# Patient Record
Sex: Male | Born: 1935 | Race: Black or African American | Hispanic: No | Smoking: Never smoker
Health system: Southern US, Community
[De-identification: ages and names within clinical notes are randomized; demographics above are authoritative.]

## PROBLEM LIST (undated history)

## (undated) DIAGNOSIS — K219 Gastro-esophageal reflux disease without esophagitis: Secondary | ICD-10-CM

## (undated) DIAGNOSIS — I639 Cerebral infarction, unspecified: Secondary | ICD-10-CM

## (undated) DIAGNOSIS — J189 Pneumonia, unspecified organism: Secondary | ICD-10-CM

## (undated) DIAGNOSIS — M1990 Unspecified osteoarthritis, unspecified site: Secondary | ICD-10-CM

## (undated) DIAGNOSIS — I10 Essential (primary) hypertension: Secondary | ICD-10-CM

## (undated) HISTORY — PX: TUMOR EXCISION: SHX421

## (undated) HISTORY — PX: TONSILLECTOMY: SUR1361

---

## 2003-02-02 ENCOUNTER — Encounter: Payer: Self-pay | Admitting: Emergency Medicine

## 2003-02-03 ENCOUNTER — Encounter: Payer: Self-pay | Admitting: Surgery

## 2003-02-03 ENCOUNTER — Inpatient Hospital Stay (HOSPITAL_COMMUNITY): Admission: EM | Admit: 2003-02-03 | Discharge: 2003-02-06 | Payer: Self-pay | Admitting: Emergency Medicine

## 2003-02-04 ENCOUNTER — Encounter: Payer: Self-pay | Admitting: General Surgery

## 2003-02-05 ENCOUNTER — Encounter: Payer: Self-pay | Admitting: General Surgery

## 2010-11-18 ENCOUNTER — Emergency Department (HOSPITAL_BASED_OUTPATIENT_CLINIC_OR_DEPARTMENT_OTHER)
Admission: EM | Admit: 2010-11-18 | Discharge: 2010-11-18 | Disposition: A | Discharge status: Home / Self Care | Payer: Medicare Other | Attending: Emergency Medicine | Admitting: Emergency Medicine

## 2010-11-18 DIAGNOSIS — L03019 Cellulitis of unspecified finger: Secondary | ICD-10-CM | POA: Insufficient documentation

## 2010-11-18 DIAGNOSIS — E119 Type 2 diabetes mellitus without complications: Secondary | ICD-10-CM | POA: Insufficient documentation

## 2010-11-18 DIAGNOSIS — L02519 Cutaneous abscess of unspecified hand: Secondary | ICD-10-CM | POA: Insufficient documentation

## 2010-11-21 ENCOUNTER — Emergency Department (HOSPITAL_BASED_OUTPATIENT_CLINIC_OR_DEPARTMENT_OTHER)
Admission: EM | Admit: 2010-11-21 | Discharge: 2010-11-21 | Disposition: A | Discharge status: Home / Self Care | Payer: Medicare Other | Attending: Emergency Medicine | Admitting: Emergency Medicine

## 2010-11-21 DIAGNOSIS — L03019 Cellulitis of unspecified finger: Secondary | ICD-10-CM | POA: Insufficient documentation

## 2010-11-21 DIAGNOSIS — L02519 Cutaneous abscess of unspecified hand: Secondary | ICD-10-CM | POA: Insufficient documentation

## 2010-11-21 DIAGNOSIS — E119 Type 2 diabetes mellitus without complications: Secondary | ICD-10-CM | POA: Insufficient documentation

## 2010-11-21 LAB — WOUND CULTURE

## 2012-04-21 NOTE — H&P (Signed)
History & Physical:   DATE:     NAMEYuchen, Tom Wang      [id number]       HISTORY OF PRESENT ILLNESS: Chief Eye Complaints blurry vision   OD interfering  with daily activates  & glaucoma  HPI: EYES: Reports symptoms of eye discharge .  & redness occasional      LOCATION:      QUALITY/COURSE:   Reports condition is worsening.        INTENSITY/SEVERITY:     moderate   DURATION:   Reports the general length of symptoms to be months.      ONSET/TIMING:   Reports occurrence as 1 month ago.      CONTEXT/WHEN:   Reports usually associated with   MODIFIERS/TREATMENTS:  Improved by              ROS:   GEN- Constitutional: HENT: GEN - Endocrine: Reports symptoms of diabetes.    LUNGS/Respiratory:  HEART/Cardiovascular: Reports symptoms of hypertension.    ABD/Gastrointestinal:   Musculoskeletal (BJE): NEURO/Neurological: PSYCH/Psychiatric:    Is the pt oriented to time, place, person? yes  Mood depressed __ normal  agitated __  ACTIVE PROBLEMS: Posterior subcapsular polar senile cataract   ICD#366.14  OD  Vitreous detachment   ICD#379.21  each eye  Nuclear sclerosis   ICD#366.16      both eyes worse right eye Primary open angle glaucoma   ICD#365.11  SURGERIES: Pick List - Surgeries  MEDICATIONS: Xalatan (Latanoprost) Ophthalmic Solution:   0.005% (solution)  SIG-  1 drop(s)  drop into each eye   once a day in the PM  REVIEW OF SYSTEMS:   TOBACCO: Pick List - Tobacco - Summary  SOCIAL HISTORY: retired  Research scientist (medical) - Social History  FAMILY HISTORY:  Negative family history   ALLERGIES: Starter - Allergies - Summary:  PHYSICAL EXAMINATION: [facility]    OD Naplate CF @ 6FT ph 20/400      cc 20/          OS Newtonia 20/ 40-2   ph 20/30        cc 20/  EYEGLASSES: OD                                              OS                ADD:  MR  OD     -2.00 sph 20/400        OS     -0.75 + 1.00 x 001  20/40+ ADD  VF:  OD                                                OS  PUPILS: 3 mm normal reaction each eye negative Page Spiro  EYELIDS & OCULAR ADNEXA each eye infraorbital fat pads  SLE: Conjunctiva pinguecula each eye trace injection  Cornea arcus each eye   anterior chamber  deep and quiet  each eye Iris Brown  Lens +3 nuclear sclerosis & PSC OD +2 nuclear sclerosis OS  Vitreous posterior vitreous detachment each eye  CCT  Ta   in mmHg 15   OD       17     OS Time 10:50 AM  Gonio   Dilation tropicamide 1% phenylephrine 2.5%  Fundus:  optic nerve  OD    thin superior round slight discoloration                                                                 OS    slight discoloration and 65% cupping   Macula      OD   dull light reflex,                  OS the light reflex is dull   Vessels normal  Periphery normal   BP: 118/68     PULSE:80        RESP:20  Exam: GENERAL: Appearance: General appearance can be described as well-nourished, well-developed, and in no acute distress.    LYMPHATIC: HEAD, EARS, NOSE AND THROAT: Ears-Nose (external) Inspection: Externally, nose and ears are normal in appearance and without scars, lesions, or nodules.      Otoscopic Exam: External auditory canals and tympanic membranes are normal.      Hearing assessment shows no problems with normal conversation.    Nose exam, internally, reveals nasal mucosa, septum and turbinates are unremarkable.    Teeth, Gingiva, and Lip Exams: No lesions or evidence of infection.      Oropharynx demonstrates oral mucosa, salivary glands, tongue, tonsils, posterior pharynx, hard-soft palates are normal.  EYES: see above  NECK: Neck tissue exam demonstrates no masses, symmetrical, and trachea is midline.      LUNGS and RESPIRATORY: Lung auscultation elicits no wheezing, rhonci, rales or rubs and with equal breath sounds.    Respiratory effort described as breathing is unlabored and chest movement is symmetrical.    HEART (Cardiovascular): Heart  auscultation discovers regular rate and rhythm; no murmur, gallop or rub. Normal heart sounds.    ABDOMEN (Gastrointestinal): Mass/Tenderness Exam: Neither are present.     Liver/Spleen: No hepatomegaly or splenomegaly.   MUSCULOSKELETAL (BJE): Inspection-Palpation: No major bone, joint, tendon, or muscle changes.      NEUROLOGICAL: Alert and oriented. No major deficits of coordination or sensation.      PSYCHIATRIC: Insight and judgment appear  both to be intact and appropriate.    Mood and affect are described as normal mood and full affect.    SKIN: Skin Inspection: No rashes or lesions.  ADMITTING DIAGNOSIS: Posterior subcapsular polar senile cataract   ICD#366.14  OD  Vitreous detachment   ICD#379.21  each eye  Nuclear sclerosis   ICD#366.16      both eyes worse right eye Primary open angle glaucoma   ICD#365.11  SURGICAL TREATMENT PLAN: Phacoemulsification with intraocular lens implant OD the risks and benefits of the surgery have been reviewed with the patient and he agrees to proceed. Refraction and A scan today Actions:    CPT Codes:     Form completed for work.  Actions:  Plans for New Diagnosis:  Lab/Tests:  per anesthesia    ___________________________ Tom Wang, Montez Hageman Starter - Inactive Problems:

## 2012-04-26 ENCOUNTER — Ambulatory Visit (HOSPITAL_COMMUNITY)
Admission: RE | Admit: 2012-04-26 | Discharge: 2012-04-26 | Disposition: A | Discharge status: Home / Self Care | Payer: Medicare Other | Source: Ambulatory Visit | Attending: Ophthalmology | Admitting: Ophthalmology

## 2012-04-26 ENCOUNTER — Ambulatory Visit (HOSPITAL_COMMUNITY): Payer: Medicare Other | Admitting: Anesthesiology

## 2012-04-26 ENCOUNTER — Encounter (HOSPITAL_COMMUNITY): Payer: Self-pay | Admitting: Anesthesiology

## 2012-04-26 ENCOUNTER — Encounter (HOSPITAL_COMMUNITY)
Admission: RE | Disposition: A | Discharge status: Home / Self Care | Payer: Self-pay | Source: Ambulatory Visit | Attending: Ophthalmology

## 2012-04-26 ENCOUNTER — Ambulatory Visit (HOSPITAL_COMMUNITY): Payer: Medicare Other

## 2012-04-26 ENCOUNTER — Encounter (HOSPITAL_COMMUNITY): Payer: Self-pay | Admitting: *Deleted

## 2012-04-26 ENCOUNTER — Other Ambulatory Visit: Payer: Self-pay

## 2012-04-26 DIAGNOSIS — H43819 Vitreous degeneration, unspecified eye: Secondary | ICD-10-CM | POA: Insufficient documentation

## 2012-04-26 DIAGNOSIS — H4011X0 Primary open-angle glaucoma, stage unspecified: Secondary | ICD-10-CM | POA: Insufficient documentation

## 2012-04-26 DIAGNOSIS — H25049 Posterior subcapsular polar age-related cataract, unspecified eye: Secondary | ICD-10-CM | POA: Insufficient documentation

## 2012-04-26 DIAGNOSIS — H2510 Age-related nuclear cataract, unspecified eye: Secondary | ICD-10-CM | POA: Insufficient documentation

## 2012-04-26 DIAGNOSIS — H409 Unspecified glaucoma: Secondary | ICD-10-CM | POA: Insufficient documentation

## 2012-04-26 HISTORY — DX: Gastro-esophageal reflux disease without esophagitis: K21.9

## 2012-04-26 HISTORY — PX: CATARACT EXTRACTION W/PHACO: SHX586

## 2012-04-26 HISTORY — DX: Unspecified osteoarthritis, unspecified site: M19.90

## 2012-04-26 HISTORY — DX: Essential (primary) hypertension: I10

## 2012-04-26 HISTORY — DX: Pneumonia, unspecified organism: J18.9

## 2012-04-26 LAB — CBC
HCT: 42 % (ref 39.0–52.0)
Hemoglobin: 13.8 g/dL (ref 13.0–17.0)
MCH: 26.3 pg (ref 26.0–34.0)
MCV: 80.2 fL (ref 78.0–100.0)
RBC: 5.24 MIL/uL (ref 4.22–5.81)

## 2012-04-26 LAB — GLUCOSE, CAPILLARY: Glucose-Capillary: 276 mg/dL — ABNORMAL HIGH (ref 70–99)

## 2012-04-26 LAB — BASIC METABOLIC PANEL
CO2: 28 mEq/L (ref 19–32)
Chloride: 101 mEq/L (ref 96–112)
Glucose, Bld: 342 mg/dL — ABNORMAL HIGH (ref 70–99)
Potassium: 4.6 mEq/L (ref 3.5–5.1)
Sodium: 136 mEq/L (ref 135–145)

## 2012-04-26 SURGERY — EXTRACTION, CATARACT, WITH IOL INSERTION
Anesthesia: Monitor Anesthesia Care | Laterality: Right

## 2012-04-26 SURGERY — PHACOEMULSIFICATION, CATARACT, WITH IOL INSERTION
Anesthesia: Monitor Anesthesia Care | Site: Eye | Laterality: Right | Wound class: Clean

## 2012-04-26 MED ORDER — CYCLOPENTOLATE HCL 1 % OP SOLN
OPHTHALMIC | Status: AC
  Administered 2012-04-26: 1 [drp]
  Filled 2012-04-26: qty 2

## 2012-04-26 MED ORDER — HYALURONIDASE HUMAN 150 UNIT/ML IJ SOLN
INTRAMUSCULAR | Status: AC
  Filled 2012-04-26: qty 1

## 2012-04-26 MED ORDER — SODIUM HYALURONATE 10 MG/ML IO SOLN
INTRAOCULAR | Status: AC
  Filled 2012-04-26: qty 0.85

## 2012-04-26 MED ORDER — ACETYLCHOLINE CHLORIDE 1:100 IO SOLR
INTRAOCULAR | Status: AC
  Filled 2012-04-26: qty 1

## 2012-04-26 MED ORDER — BSS IO SOLN
INTRAOCULAR | Status: DC | PRN
  Administered 2012-04-26: 15 mL via INTRAOCULAR

## 2012-04-26 MED ORDER — EPINEPHRINE HCL 1 MG/ML IJ SOLN
INTRAMUSCULAR | Status: AC
  Filled 2012-04-26: qty 1

## 2012-04-26 MED ORDER — TOBRAMYCIN 0.3 % OP OINT
TOPICAL_OINTMENT | OPHTHALMIC | Status: DC | PRN
  Administered 2012-04-26: 1 via OPHTHALMIC

## 2012-04-26 MED ORDER — LIDOCAINE-EPINEPHRINE 2 %-1:100000 IJ SOLN
INTRAMUSCULAR | Status: AC
  Filled 2012-04-26: qty 1

## 2012-04-26 MED ORDER — TETRACAINE HCL 0.5 % OP SOLN
OPHTHALMIC | Status: AC
  Filled 2012-04-26: qty 2

## 2012-04-26 MED ORDER — GATIFLOXACIN 0.5 % OP SOLN
1.0000 [drp] | OPHTHALMIC | Status: AC
  Administered 2012-04-26 (×3): 1 [drp] via OPHTHALMIC

## 2012-04-26 MED ORDER — SODIUM CHLORIDE 0.9 % IV SOLN
INTRAVENOUS | Status: DC | PRN
  Administered 2012-04-26: 15:00:00 via INTRAVENOUS

## 2012-04-26 MED ORDER — ACETYLCHOLINE CHLORIDE 1:100 IO SOLR
INTRAOCULAR | Status: DC | PRN
  Administered 2012-04-26: 20 mg via INTRAOCULAR

## 2012-04-26 MED ORDER — DEXAMETHASONE SODIUM PHOSPHATE 10 MG/ML IJ SOLN
INTRAMUSCULAR | Status: AC
  Filled 2012-04-26: qty 1

## 2012-04-26 MED ORDER — LIDOCAINE HCL 2 % IJ SOLN
INTRAMUSCULAR | Status: AC
  Filled 2012-04-26: qty 20

## 2012-04-26 MED ORDER — BSS IO SOLN
INTRAOCULAR | Status: AC
  Filled 2012-04-26: qty 500

## 2012-04-26 MED ORDER — TROPICAMIDE 1 % OP SOLN
OPHTHALMIC | Status: AC
  Administered 2012-04-26: 1 [drp] via OPHTHALMIC
  Filled 2012-04-26: qty 3

## 2012-04-26 MED ORDER — NA CHONDROIT SULF-NA HYALURON 40-30 MG/ML IO SOLN
INTRAOCULAR | Status: AC
  Filled 2012-04-26: qty 0.5

## 2012-04-26 MED ORDER — TETRACAINE HCL 0.5 % OP SOLN
OPHTHALMIC | Status: AC
  Administered 2012-04-26: 1 [drp] via OPHTHALMIC
  Filled 2012-04-26: qty 2

## 2012-04-26 MED ORDER — GENTAMICIN SULFATE 40 MG/ML IJ SOLN
INTRAMUSCULAR | Status: AC
  Filled 2012-04-26: qty 2

## 2012-04-26 MED ORDER — LIDOCAINE-EPINEPHRINE 2 %-1:100000 IJ SOLN
INTRAMUSCULAR | Status: DC | PRN
  Administered 2012-04-26: 10 mL

## 2012-04-26 MED ORDER — PILOCARPINE HCL 4 % OP GEL
OPHTHALMIC | Status: AC
  Filled 2012-04-26: qty 4

## 2012-04-26 MED ORDER — HYALURONIDASE HUMAN 150 UNIT/ML IJ SOLN
INTRAMUSCULAR | Status: DC | PRN
  Administered 2012-04-26: 150 [IU]

## 2012-04-26 MED ORDER — EPINEPHRINE HCL 1 MG/ML IJ SOLN
INTRAOCULAR | Status: DC | PRN
  Administered 2012-04-26: 15:00:00

## 2012-04-26 MED ORDER — SODIUM CHLORIDE 0.9 % IV SOLN
INTRAVENOUS | Status: DC
  Administered 2012-04-26: 15:00:00 via INTRAVENOUS

## 2012-04-26 MED ORDER — CYCLOPENTOLATE HCL 1 % OP SOLN
1.0000 [drp] | OPHTHALMIC | Status: AC
  Administered 2012-04-26 (×3): 1 [drp] via OPHTHALMIC

## 2012-04-26 MED ORDER — PROPOFOL 10 MG/ML IV BOLUS
INTRAVENOUS | Status: DC | PRN
  Administered 2012-04-26: 20 mg via INTRAVENOUS

## 2012-04-26 MED ORDER — TOBRAMYCIN-DEXAMETHASONE 0.3-0.1 % OP OINT
TOPICAL_OINTMENT | OPHTHALMIC | Status: AC
  Filled 2012-04-26: qty 3.5

## 2012-04-26 MED ORDER — PHENYLEPHRINE HCL 2.5 % OP SOLN
OPHTHALMIC | Status: AC
  Administered 2012-04-26: 1 [drp] via OPHTHALMIC
  Filled 2012-04-26: qty 3

## 2012-04-26 MED ORDER — BUPIVACAINE HCL 0.75 % IJ SOLN
INTRAMUSCULAR | Status: AC
  Filled 2012-04-26: qty 10

## 2012-04-26 MED ORDER — TROPICAMIDE 1 % OP SOLN
1.0000 [drp] | OPHTHALMIC | Status: AC
  Administered 2012-04-26 (×3): 1 [drp] via OPHTHALMIC

## 2012-04-26 MED ORDER — TETRACAINE HCL 0.5 % OP SOLN
1.0000 [drp] | OPHTHALMIC | Status: AC
  Administered 2012-04-26: 1 [drp] via OPHTHALMIC

## 2012-04-26 MED ORDER — BUPIVACAINE HCL 0.75 % IJ SOLN
INTRAMUSCULAR | Status: DC | PRN
  Administered 2012-04-26: 10 mL

## 2012-04-26 MED ORDER — PROVISC 10 MG/ML IO SOLN
INTRAOCULAR | Status: DC | PRN
  Administered 2012-04-26: .85 mL via INTRAOCULAR

## 2012-04-26 MED ORDER — PHENYLEPHRINE HCL 2.5 % OP SOLN
1.0000 [drp] | OPHTHALMIC | Status: AC
  Administered 2012-04-26 (×3): 1 [drp] via OPHTHALMIC

## 2012-04-26 MED ORDER — GATIFLOXACIN 0.5 % OP SOLN
OPHTHALMIC | Status: AC
  Administered 2012-04-26: 1 [drp] via OPHTHALMIC
  Filled 2012-04-26: qty 2.5

## 2012-04-26 MED ORDER — NA CHONDROIT SULF-NA HYALURON 40-30 MG/ML IO SOLN
INTRAOCULAR | Status: DC | PRN
  Administered 2012-04-26 (×2): 0.5 mL via INTRAOCULAR

## 2012-04-26 MED ORDER — LACTATED RINGERS IV SOLN: INTRAVENOUS | Status: DC

## 2012-04-26 MED ORDER — BSS IO SOLN
INTRAOCULAR | Status: AC
  Filled 2012-04-26: qty 15

## 2012-04-26 SURGICAL SUPPLY — 38 items
APPLICATOR COTTON TIP 6IN STRL (MISCELLANEOUS) ×2 IMPLANT
APPLICATOR DR MATTHEWS STRL (MISCELLANEOUS) ×2 IMPLANT
BLADE KERATOME 2.75 (BLADE) ×2 IMPLANT
BLADE MINI RND TIP GREEN BEAV (BLADE) IMPLANT
BLADE STAB KNIFE 45DEG (BLADE) IMPLANT
CANNULA ANTERIOR CHAMBER 27GA (MISCELLANEOUS) ×2 IMPLANT
CLOTH BEACON ORANGE TIMEOUT ST (SAFETY) ×2 IMPLANT
CORDS BIPOLAR (ELECTRODE) IMPLANT
DRAPE OPHTHALMIC 40X48 W POUCH (DRAPES) ×2 IMPLANT
DRAPE RETRACTOR (MISCELLANEOUS) ×2 IMPLANT
FILTER BLUE MILLIPORE (MISCELLANEOUS) IMPLANT
GLOVE BIO SURGEON STRL SZ8 (GLOVE) ×2 IMPLANT
GLOVE SURG SS PI 6.5 STRL IVOR (GLOVE) ×4 IMPLANT
GOWN STRL NON-REIN LRG LVL3 (GOWN DISPOSABLE) ×6 IMPLANT
KIT BASIN OR (CUSTOM PROCEDURE TRAY) ×2 IMPLANT
KIT ROOM TURNOVER OR (KITS) ×2 IMPLANT
KNIFE CRESCENT 2.5 55 ANG (BLADE) IMPLANT
LENS IOL ACRSF IQ PC 20.0 (Intraocular Lens) ×1 IMPLANT
LENS IOL ACRYSOF IQ POST 20.0 (Intraocular Lens) ×2 IMPLANT
MARKER SKIN DUAL TIP RULER LAB (MISCELLANEOUS) ×2 IMPLANT
MASK EYE SHIELD (GAUZE/BANDAGES/DRESSINGS) ×2 IMPLANT
NEEDLE 25GX 5/8IN NON SAFETY (NEEDLE) ×4 IMPLANT
NS IRRIG 1000ML POUR BTL (IV SOLUTION) ×2 IMPLANT
PACK CATARACT CUSTOM (CUSTOM PROCEDURE TRAY) ×2 IMPLANT
PACK CATARACT MCHSCP (PACKS) ×2 IMPLANT
PAD ARMBOARD 7.5X6 YLW CONV (MISCELLANEOUS) ×4 IMPLANT
PAD EYE OVAL STERILE LF (GAUZE/BANDAGES/DRESSINGS) ×2 IMPLANT
PROBE ANTERIOR 20G W/INFUS NDL (MISCELLANEOUS) IMPLANT
SPEAR EYE SURG WECK-CEL (MISCELLANEOUS) IMPLANT
SUT ETHILON 10 0 CS140 6 (SUTURE) IMPLANT
SUT SILK 4 0 C 3 735G (SUTURE) IMPLANT
SUT SILK 6 0 G 6 (SUTURE) IMPLANT
SUT VICRYL 8 0 TG140 8 (SUTURE) IMPLANT
SYR 3ML LL SCALE MARK (SYRINGE) IMPLANT
TAPE SURG TRANSPORE 1 IN (GAUZE/BANDAGES/DRESSINGS) ×1 IMPLANT
TAPE SURGICAL TRANSPORE 1 IN (GAUZE/BANDAGES/DRESSINGS) ×1
TOWEL OR 17X24 6PK STRL BLUE (TOWEL DISPOSABLE) ×4 IMPLANT
WATER STERILE IRR 1000ML POUR (IV SOLUTION) ×2 IMPLANT

## 2012-04-26 NOTE — Anesthesia Postprocedure Evaluation (Signed)
Anesthesia Post Note  Patient: Tom Wang  Procedure(s) Performed: Procedure(s) (LRB): CATARACT EXTRACTION PHACO AND INTRAOCULAR LENS PLACEMENT (IOC) (Right)  Anesthesia type: general  Patient location: PACU  Post pain: Pain level controlled  Post assessment: Patient's Cardiovascular Status Stable  Last Vitals:  Filed Vitals:   04/26/12 1143  BP: 161/84  Pulse: 66  Temp: 36.6 C  Resp: 18    Post vital signs: Reviewed and stable  Level of consciousness: sedated  Complications: No apparent anesthesia complications

## 2012-04-26 NOTE — Interval H&P Note (Signed)
History and Physical Interval Note:  04/26/2012 2:06 PM  Tom Wang  has presented today for surgery, with the diagnosis of CATARACT RIGHT EYE  The various methods of treatment have been discussed with the patient and family. After consideration of risks, benefits and other options for treatment, the patient has consented to  Procedure(s) (LRB) with comments: CATARACT EXTRACTION PHACO AND INTRAOCULAR LENS PLACEMENT (IOC) (Right) as a surgical intervention .  The patient's history has been reviewed, patient examined, no change in status, stable for surgery.  I have reviewed the patient's chart and labs.  Questions were answered to the patient's satisfaction.     Aarsh Fristoe

## 2012-04-26 NOTE — Transfer of Care (Signed)
Immediate Anesthesia Transfer of Care Note  Patient: Tom Wang  Procedure(s) Performed: Procedure(s) (LRB) with comments: CATARACT EXTRACTION PHACO AND INTRAOCULAR LENS PLACEMENT (IOC) (Right)  Patient Location: PACU  Anesthesia Type: MAC  Level of Consciousness: awake  Airway & Oxygen Therapy: Patient Spontanous Breathing and Patient connected to nasal cannula oxygen  Post-op Assessment: Report given to PACU RN, Post -op Vital signs reviewed and stable and Patient moving all extremities  Post vital signs: Reviewed and stable  Complications: No apparent anesthesia complications

## 2012-04-26 NOTE — Op Note (Signed)
Preoperative diagnosis: Visually significant cataract and glaucoma right eye Postoperative diagnosis: Same Procedure phacoemulsification with intraocular lens implant Complications: None Surgeon Chalmers Guest Asst. and Rico Ala Anesthesia: 2% Xylocaine in a 50-50 mixture 0.75% Marcaine with epinephrine X. line procedure: The patient was taken to the operating room where he was given a peribulbar block with the aforementioned local anesthetic agent. Following this the patient's face was prepped and draped in the usual sterile fashion. With the surgeon sitting temporally the operating microscope in position a Weck-Cel sponge was used to fixate the globe and a 15 blade was used to enter through superior temporal clear cornea at the 11:00 position and Viscoat was injected into the eye. An additional Weck-Cel sponges used to fixate the globe and a 2.75 mm keratome blade was used to temporal crit clear cornea to into the eye following this additional Viscoat was injected. A bent 25-gauge needle was used to incise the anterior capsule and a continuous tear curvilinear capsulorrhexis was formed BSS was used to hydrodissect and hydrodelineate the nucleus. Following this the phacoemulsification unit was then used to sculpt the nucleus the nucleus was sculpted into 4 quadrants and the quadrants were cracked 3 nuclear fragments were removed the residual fragment was elevated using Viscoat elevation with Viscoat following this this fragment was also elevated from the eye the posterior capsule remained intact the I/A was used to remove part of the epinucleus and strip 5 was from the capsular bag. Following this the intraocular lens implant was examined and noted to have no defects it was an Alcon AcrySof SN 60 WF 20.0 diopter lens number [id number] the lens is placed in the lens injector was injected and unfolded into the capsular bag. The lens was positioned with a Kuglen hook subincisional cortex and viscoelastic  were removed from the eye the incision was was hydrated with balanced salt solution Miochol was injected in the eye the eye was pressurized and there was no leakage Miochol was used through the paracentesis tract to hydrate the paracentesis there being no leakage all instrumentation was removed from the eye topical TobraDex ointment was applied a patch and Fox U. were placed and the patient returned to recovery area in stable condition [employer] M.D.

## 2012-04-26 NOTE — Anesthesia Preprocedure Evaluation (Signed)
Anesthesia Evaluation  Patient identified by MRN, date of birth, ID band Patient awake    Reviewed: Allergy & Precautions, H&P , NPO status , Patient's Chart, lab work & pertinent test results  History of Anesthesia Complications Negative for: history of anesthetic complications  Airway Mallampati: II TM Distance: >3 FB Neck ROM: Full    Dental  (+) Missing and Dental Advisory Given   Pulmonary neg pneumonia -, former smoker,  breath sounds clear to auscultation  Pulmonary exam normal       Cardiovascular hypertension (borderline, no meds), Rhythm:Regular Rate:Normal     Neuro/Psych negative neurological ROS     GI/Hepatic Neg liver ROS, GERD- (diet controlled)  Controlled,  Endo/Other  diabetes (glu 276), Well Controlled, Type 2, Oral Hypoglycemic Agents  Renal/GU negative Renal ROS     Musculoskeletal   Abdominal (+) + obese,   Peds  Hematology   Anesthesia Other Findings   Reproductive/Obstetrics                           Anesthesia Physical Anesthesia Plan  ASA: II  Anesthesia Plan: MAC   Post-op Pain Management:    Induction: Intravenous  Airway Management Planned: Nasal Cannula  Additional Equipment:   Intra-op Plan:   Post-operative Plan:   Informed Consent: I have reviewed the patients History and Physical, chart, labs and discussed the procedure including the risks, benefits and alternatives for the proposed anesthesia with the patient or authorized representative who has indicated his/her understanding and acceptance.   Dental advisory given  Plan Discussed with: CRNA and Surgeon  Anesthesia Plan Comments: (Plan routine monitors, MAC with block by surgeon  )        Anesthesia Quick Evaluation

## 2012-04-26 NOTE — Preoperative (Signed)
Beta Blockers   Reason not to administer Beta Blockers:Not Applicable 

## 2012-04-27 ENCOUNTER — Encounter (HOSPITAL_COMMUNITY): Payer: Self-pay | Admitting: Ophthalmology

## 2013-04-25 ENCOUNTER — Encounter (HOSPITAL_BASED_OUTPATIENT_CLINIC_OR_DEPARTMENT_OTHER): Payer: Self-pay | Admitting: *Deleted

## 2013-04-25 ENCOUNTER — Emergency Department (HOSPITAL_BASED_OUTPATIENT_CLINIC_OR_DEPARTMENT_OTHER)
Admission: EM | Admit: 2013-04-25 | Discharge: 2013-04-25 | Disposition: A | Discharge status: Home / Self Care | Payer: Medicare Other | Attending: Emergency Medicine | Admitting: Emergency Medicine

## 2013-04-25 ENCOUNTER — Emergency Department (HOSPITAL_BASED_OUTPATIENT_CLINIC_OR_DEPARTMENT_OTHER): Payer: Medicare Other

## 2013-04-25 DIAGNOSIS — S8002XA Contusion of left knee, initial encounter: Secondary | ICD-10-CM

## 2013-04-25 DIAGNOSIS — S7002XA Contusion of left hip, initial encounter: Secondary | ICD-10-CM

## 2013-04-25 DIAGNOSIS — S5002XA Contusion of left elbow, initial encounter: Secondary | ICD-10-CM

## 2013-04-25 DIAGNOSIS — S5000XA Contusion of unspecified elbow, initial encounter: Secondary | ICD-10-CM | POA: Insufficient documentation

## 2013-04-25 DIAGNOSIS — Z794 Long term (current) use of insulin: Secondary | ICD-10-CM | POA: Insufficient documentation

## 2013-04-25 DIAGNOSIS — S7000XA Contusion of unspecified hip, initial encounter: Secondary | ICD-10-CM | POA: Insufficient documentation

## 2013-04-25 DIAGNOSIS — Z8719 Personal history of other diseases of the digestive system: Secondary | ICD-10-CM | POA: Insufficient documentation

## 2013-04-25 DIAGNOSIS — Z8701 Personal history of pneumonia (recurrent): Secondary | ICD-10-CM | POA: Insufficient documentation

## 2013-04-25 DIAGNOSIS — S8000XA Contusion of unspecified knee, initial encounter: Secondary | ICD-10-CM | POA: Insufficient documentation

## 2013-04-25 DIAGNOSIS — R296 Repeated falls: Secondary | ICD-10-CM | POA: Insufficient documentation

## 2013-04-25 DIAGNOSIS — I10 Essential (primary) hypertension: Secondary | ICD-10-CM | POA: Insufficient documentation

## 2013-04-25 DIAGNOSIS — S6990XA Unspecified injury of unspecified wrist, hand and finger(s), initial encounter: Secondary | ICD-10-CM | POA: Insufficient documentation

## 2013-04-25 DIAGNOSIS — W19XXXA Unspecified fall, initial encounter: Secondary | ICD-10-CM

## 2013-04-25 DIAGNOSIS — Z8739 Personal history of other diseases of the musculoskeletal system and connective tissue: Secondary | ICD-10-CM | POA: Insufficient documentation

## 2013-04-25 DIAGNOSIS — S8990XA Unspecified injury of unspecified lower leg, initial encounter: Secondary | ICD-10-CM | POA: Insufficient documentation

## 2013-04-25 DIAGNOSIS — Y929 Unspecified place or not applicable: Secondary | ICD-10-CM | POA: Insufficient documentation

## 2013-04-25 DIAGNOSIS — S59909A Unspecified injury of unspecified elbow, initial encounter: Secondary | ICD-10-CM | POA: Insufficient documentation

## 2013-04-25 DIAGNOSIS — E119 Type 2 diabetes mellitus without complications: Secondary | ICD-10-CM | POA: Insufficient documentation

## 2013-04-25 DIAGNOSIS — Y939 Activity, unspecified: Secondary | ICD-10-CM | POA: Insufficient documentation

## 2013-04-25 DIAGNOSIS — Z79899 Other long term (current) drug therapy: Secondary | ICD-10-CM | POA: Insufficient documentation

## 2013-04-25 MED ORDER — HYDROCODONE-ACETAMINOPHEN 5-325 MG PO TABS: 2.0000 | ORAL_TABLET | ORAL | Status: DC | PRN

## 2013-04-25 NOTE — ED Notes (Signed)
Pt c/o fall x 1 day ago left hip and elbow pain

## 2013-04-25 NOTE — ED Provider Notes (Signed)
CSN: [id number]     Arrival date & time 04/25/13  1551 History   First MD Initiated Contact with Patient 04/25/13 1604     Chief Complaint  Patient presents with  . Fall   (Consider location/radiation/quality/duration/timing/severity/associated sxs/prior Treatment) HPI Comments: Patient is an 79 year old male past medical history of diabetes and peripheral neuropathy. He presents today with complaints of pain in the left hip, knee, and elbow. He states he's been having the hip and knee pain for several months and recently had an MRI performed for this. He does not know the results of this. He states that yesterday his legs became numb and caused him to fall. Since that time the pain in the hip and knee has been somewhat worse and he has pain in left elbow.  Patient is a 79 y.o. male presenting with fall. The history is provided by the patient.  Fall This is a new problem. The current episode started yesterday. The problem occurs constantly. The problem has not changed since onset.The symptoms are aggravated by walking. Nothing relieves the symptoms. He has tried nothing for the symptoms. The treatment provided no relief.    Past Medical History  Diagnosis Date  . Hypertension   . Diabetes mellitus     oral meds only  . Pneumonia     20 years ago .  Marland Kitchen GERD (gastroesophageal reflux disease)   . Arthritis     ankles   Past Surgical History  Procedure Laterality Date  . Tonsillectomy    . 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   Marland Kitchen Cataract extraction w/phaco  04/26/2012    Procedure: CATARACT EXTRACTION PHACO AND INTRAOCULAR LENS PLACEMENT (IOC);  Surgeon: Chalmers Guest, MD;  Location: [facility] OR;  Service: Ophthalmology;  Laterality: Right;   History reviewed. No pertinent family history. History  Substance Use Topics  . Smoking status: Never Smoker   . Smokeless tobacco: Not on file  . Alcohol Use: No    Review of Systems  All other systems reviewed and are  negative.    Allergies  Review of patient's allergies indicates no known allergies.  Home Medications   Current Outpatient Rx  Name  Route  Sig  Dispense  Refill  . gabapentin (NEURONTIN) 300 MG capsule   Oral   Take 300 mg by mouth 4 (four) times daily.         . insulin lispro protamine-lispro (HUMALOG 75/25) (75-25) 100 UNIT/ML SUSP injection   Subcutaneous   Inject into the skin.         Marland Kitchen lisinopril (PRINIVIL,ZESTRIL) 10 MG tablet   Oral   Take 10 mg by mouth daily.         . Ascorbic Acid (VITAMIN C) 1000 MG tablet   Oral   Take 1,000 mg by mouth daily.         Marland Kitchen Besifloxacin HCl (BESIVANCE) 0.6 % SUSP   Right Eye   Place 1 drop into the right eye 3 (three) times daily.         Marland Kitchen CALCIUM-MAGNESIUM-ZINC PO   Oral   Take 1 tablet by mouth daily.         . cholecalciferol (VITAMIN D) 1000 UNITS tablet   Oral   Take 1,000 Units by mouth 2 (two) times daily.         Marland Kitchen CINNAMON PO   Oral   Take 1 capsule by mouth every Monday, Wednesday, and Friday.         Marland Kitchen  fish oil-omega-3 fatty acids 1000 MG capsule   Oral   Take 1 g by mouth 2 (two) times a week.         Marland Kitchen GLIMEPIRIDE PO   Oral   Take 1 tablet by mouth 2 (two) times daily.         . Multiple Vitamin (MULTIVITAMIN WITH MINERALS) TABS   Oral   Take 1 tablet by mouth 2 (two) times a week.          BP 135/73  Pulse 94  Temp(Src) 99.2 F (37.3 C) (Oral)  Resp 16  Ht 6\' 2"  (1.88 m)  Wt 244 lb (110.678 kg)  BMI 31.31 kg/m2  SpO2 100% Physical Exam  Nursing note and vitals reviewed. Constitutional: He is oriented to person, place, and time. He appears well-developed and well-nourished. No distress.  HENT:  Head: Normocephalic and atraumatic.  Mouth/Throat: Oropharynx is clear and moist.  Neck: Normal range of motion. Neck supple.  Cardiovascular: Normal rate.   Pulmonary/Chest: Effort normal and breath sounds normal.  Abdominal: Bowel sounds are normal.  Musculoskeletal:  Normal range of motion.  The left elbow appears grossly normal. He has good range of motion. Ulnar and radial pulses are easily palpable and he can move the fingers and flex and extend the rest without difficulty.  The left hip appears grossly normal. There is tenderness to palpation over the lateral aspect. He seems to have good range of motion without limitation. The left knee appears grossly normal without effusion. It is stable AP and laterally. The dorsalis pedis pulse is easily palpable and motor and sensation are intact as well.  Neurological: He is alert and oriented to person, place, and time.  Skin: Skin is warm and dry. He is not diaphoretic.    ED Course  Procedures (including critical care time) Labs Review Labs Reviewed - No data to display Imaging Review No results found.  MDM  No diagnosis found. Patient presents here with pain after fall. Imaging studies of the hip knee and elbow are all negative for fracture. He has been having pain in his leg for a month and it is worse now since he fell. He has been walking on it and I seriously doubt there is an occult hip fracture. He will be treated with pain medication and I've instructed the patient to followup with his primary Dr. to discuss the results of the MRI he recently underwent. He is to return to the ER if his symptoms worsen or change.    Geoffery Lyons, MD 04/25/13 [phone]

## 2013-05-29 ENCOUNTER — Emergency Department (HOSPITAL_BASED_OUTPATIENT_CLINIC_OR_DEPARTMENT_OTHER)
Admission: EM | Admit: 2013-05-29 | Discharge: 2013-05-29 | Disposition: A | Discharge status: Home / Self Care | Payer: Medicare Other | Attending: Emergency Medicine | Admitting: Emergency Medicine

## 2013-05-29 ENCOUNTER — Encounter (HOSPITAL_BASED_OUTPATIENT_CLINIC_OR_DEPARTMENT_OTHER): Payer: Self-pay | Admitting: Emergency Medicine

## 2013-05-29 ENCOUNTER — Emergency Department (HOSPITAL_BASED_OUTPATIENT_CLINIC_OR_DEPARTMENT_OTHER): Payer: Medicare Other

## 2013-05-29 DIAGNOSIS — E119 Type 2 diabetes mellitus without complications: Secondary | ICD-10-CM | POA: Insufficient documentation

## 2013-05-29 DIAGNOSIS — I10 Essential (primary) hypertension: Secondary | ICD-10-CM | POA: Insufficient documentation

## 2013-05-29 DIAGNOSIS — Y929 Unspecified place or not applicable: Secondary | ICD-10-CM | POA: Insufficient documentation

## 2013-05-29 DIAGNOSIS — M25552 Pain in left hip: Secondary | ICD-10-CM

## 2013-05-29 DIAGNOSIS — Y9301 Activity, walking, marching and hiking: Secondary | ICD-10-CM | POA: Insufficient documentation

## 2013-05-29 DIAGNOSIS — Z794 Long term (current) use of insulin: Secondary | ICD-10-CM | POA: Insufficient documentation

## 2013-05-29 DIAGNOSIS — Z8719 Personal history of other diseases of the digestive system: Secondary | ICD-10-CM | POA: Insufficient documentation

## 2013-05-29 DIAGNOSIS — M19079 Primary osteoarthritis, unspecified ankle and foot: Secondary | ICD-10-CM | POA: Insufficient documentation

## 2013-05-29 DIAGNOSIS — Z8701 Personal history of pneumonia (recurrent): Secondary | ICD-10-CM | POA: Insufficient documentation

## 2013-05-29 DIAGNOSIS — W19XXXA Unspecified fall, initial encounter: Secondary | ICD-10-CM

## 2013-05-29 DIAGNOSIS — R296 Repeated falls: Secondary | ICD-10-CM | POA: Insufficient documentation

## 2013-05-29 DIAGNOSIS — Z79899 Other long term (current) drug therapy: Secondary | ICD-10-CM | POA: Insufficient documentation

## 2013-05-29 DIAGNOSIS — S79919A Unspecified injury of unspecified hip, initial encounter: Secondary | ICD-10-CM | POA: Insufficient documentation

## 2013-05-29 MED ORDER — OXYCODONE-ACETAMINOPHEN 5-325 MG PO TABS
1.0000 | ORAL_TABLET | Freq: Once | ORAL | Status: DC
  Filled 2013-05-29: qty 1

## 2013-05-29 MED ORDER — HYDROCODONE-ACETAMINOPHEN 5-325 MG PO TABS: 1.0000 | ORAL_TABLET | ORAL | Status: DC | PRN

## 2013-05-29 MED ORDER — HYDROCODONE-ACETAMINOPHEN 5-325 MG PO TABS
1.0000 | ORAL_TABLET | Freq: Once | ORAL | Status: AC
  Administered 2013-05-29: 1 via ORAL
  Filled 2013-05-29: qty 1

## 2013-05-29 NOTE — ED Notes (Signed)
Pt. Has a pinched nerve in his low back and it caused him to fall Thursday.  Pt. Is weak in legs bilat.  No LOC and no edema on the skull.

## 2013-05-29 NOTE — ED Provider Notes (Signed)
CSN: [id number]     Arrival date & time 05/29/13  1310 History   First MD Initiated Contact with Patient 05/29/13 1509     Chief Complaint  Patient presents with  . Back Pain   (Consider location/radiation/quality/duration/timing/severity/associated sxs/prior Treatment) HPI Pt presents with c/o pain in left hip and low back.  He states he has been having similar pain to this, but approx 4 days ago fell while walking with his walker.  He states he fell backward onto his low back and left side.  Since the fall he has been having increased pain in left lower back and left hip.  He has hx of DM and peripheral neuropathy and states that he has seen a neurologist in [place] recently who had diagnosed him with a pinched nerve in his back.  No weakness of legs different from his usual bilateral weakness.  No urinary retention.  He wears depends and is incontinent at baseline.  No incontinence of stool.  No fever/chills.  He states he has been taking tylenol occasionally for pain but nothing more.  He saw a chiropractor today who recommended he come to the ED for xrays.  There are no other associated systemic symptoms, there are no other alleviating or modifying factors. He is able to bear weight but does use a walker and has been favoring the left hip.   Past Medical History  Diagnosis Date  . Hypertension   . Diabetes mellitus     oral meds only  . Pneumonia     20 years ago .  Marland Kitchen GERD (gastroesophageal reflux disease)   . Arthritis     ankles   Past Surgical History  Procedure Laterality Date  . Tonsillectomy    . 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   Marland Kitchen Cataract extraction w/phaco  04/26/2012    Procedure: CATARACT EXTRACTION PHACO AND INTRAOCULAR LENS PLACEMENT (IOC);  Surgeon: Chalmers Guest, MD;  Location: [facility] OR;  Service: Ophthalmology;  Laterality: Right;   No family history on file. History  Substance Use Topics  . Smoking status: Never Smoker   . Smokeless tobacco: Not  on file  . Alcohol Use: No    Review of Systems ROS reviewed and all otherwise negative except for mentioned in HPI  Allergies  Review of patient's allergies indicates no known allergies.  Home Medications   Current Outpatient Rx  Name  Route  Sig  Dispense  Refill  . Ascorbic Acid (VITAMIN C) 1000 MG tablet   Oral   Take 1,000 mg by mouth daily.         Marland Kitchen Besifloxacin HCl (BESIVANCE) 0.6 % SUSP   Right Eye   Place 1 drop into the right eye 3 (three) times daily.         Marland Kitchen CALCIUM-MAGNESIUM-ZINC PO   Oral   Take 1 tablet by mouth daily.         . cholecalciferol (VITAMIN D) 1000 UNITS tablet   Oral   Take 1,000 Units by mouth 2 (two) times daily.         Marland Kitchen CINNAMON PO   Oral   Take 1 capsule by mouth every Monday, Wednesday, and Friday.         . fish oil-omega-3 fatty acids 1000 MG capsule   Oral   Take 1 g by mouth 2 (two) times a week.         . gabapentin (NEURONTIN) 300 MG capsule  Oral   Take 300 mg by mouth 4 (four) times daily.         Marland Kitchen GLIMEPIRIDE PO   Oral   Take 1 tablet by mouth 2 (two) times daily.         Marland Kitchen HYDROcodone-acetaminophen (NORCO) 5-325 MG per tablet   Oral   Take 2 tablets by mouth every 4 (four) hours as needed for pain.   20 tablet   0   . HYDROcodone-acetaminophen (NORCO/VICODIN) 5-325 MG per tablet   Oral   Take 1 tablet by mouth every 4 (four) hours as needed for pain.   15 tablet   0   . insulin lispro protamine-lispro (HUMALOG 75/25) (75-25) 100 UNIT/ML SUSP injection   Subcutaneous   Inject into the skin.         Marland Kitchen lisinopril (PRINIVIL,ZESTRIL) 10 MG tablet   Oral   Take 10 mg by mouth daily.         . Multiple Vitamin (MULTIVITAMIN WITH MINERALS) TABS   Oral   Take 1 tablet by mouth 2 (two) times a week.          BP 158/74  Pulse 65  Temp(Src) 98.2 F (36.8 C) (Oral)  Ht 6\' 2"  (1.88 m)  Wt 244 lb (110.678 kg)  BMI 31.31 kg/m2  SpO2 99% Vitals reviewed Physical Exam Physical  Examination: General appearance - alert, well appearing, and in no distress Mental status - alert, oriented to person, place, and time Eyes - pupils equal and reactive, extraocular eye movements intact Neck - supple, no significant adenopathy Chest - clear to auscultation, no wheezes, rales or rhonchi, symmetric air entry Heart - normal rate, regular rhythm, normal S1, S2, no murmurs, rubs, clicks or gallops Abdomen - soft, nontender, nondistended, no masses or organomegaly Back exam - no midline tenderness to palpation, ttp over left posterior hip joint, no lumbar paraspinal tenderness Neurological - alert, oriented x 3, cranial nerves grossly intact, strength 5/5 in extremities x 4, sensation distally intact Musculoskeletal - ttp over posterior left hip otherwise no joint tenderness, deformity or swelling, no pain with ROM of left hip or knee Extremities - peripheral pulses normal, no pedal edema, no clubbing or cyanosis Skin - normal coloration and turgor, no rashes  ED Course  Procedures (including critical care time) Labs Review Labs Reviewed - No data to display Imaging Review Dg Lumbar Spine Complete  05/29/2013   CLINICAL DATA:  Back pain.  EXAM: LUMBAR SPINE - COMPLETE 4+ VIEW  COMPARISON:  Lumbar spine MRI the 03/21/2013  FINDINGS: Lumbar spine vertebral bodies are normal in height and alignment. The disk spaces are preserved. No fracture is identified. There are mild facet joint degenerative changes. Overall, the bony degenerative changes seen are less than typical for the patient's age. Negative for pars defect. Sacroiliac joints appear within normal limits.  IMPRESSION: No acute bony abnormality.   Electronically Signed   By: Britta Mccreedy M.D.   On: 05/29/2013 16:06   Dg Hip Complete Left  05/29/2013   CLINICAL DATA:  Left-sided lower back pain and hip pain  EXAM: LEFT HIP - COMPLETE 2+ VIEW  COMPARISON:  04/25/2013  FINDINGS: Left hip demonstrates normal alignment without acute  displaced fracture. Hips are symmetric. Bony pelvis intact. Minor degenerative changes of the lower lumbar spine and SI joints. Nonobstructive bowel gas pattern. No diastases.  IMPRESSION: No acute osseous finding. Stable exam.   Electronically Signed   By: Ruel Favors M.D.   On:  05/29/2013 15:55   Ct Hip Left Wo Contrast  05/29/2013   CLINICAL DATA:  Fall 1 week ago.  EXAM: CT OF THE LEFT HIP WITHOUT CONTRAST  TECHNIQUE: Multidetector CT imaging was performed according to the standard protocol. Multiplanar CT image reconstructions were also generated.  COMPARISON:  Radiographs 05/29/2013.  FINDINGS: Visualized sacrum appears within normal limits. Ankylosis of the left SI joint. There is no hip effusion. No displaced left femur fracture. Mild left hip osteoarthritis is present. The visceral pelvis appears within normal limits. Containing left inguinal hernia is incidentally noted. Trabecular markings in the proximal left femur appear normal. Visualized left obturator rings appear intact. Mild pubic symphysis degenerative disease.  If there is high clinical suspicion for occult fracture or the patient refuses to weightbear, consider further evaluation with MRI. Although CT is expeditious, evidence is lacking regarding accuracy of CT over plain film radiography.  IMPRESSION: No acute osseous abnormality.   Electronically Signed   By: Andreas Newport M.D.   On: 05/29/2013 18:27    EKG Interpretation   None       MDM   1. Fall, initial encounter   2. Hip pain, acute, left    Pt presenting with worsening left hip pain after fall 4 days ago.  Pt states he has been having pain in his left hip and knee- was told he has a pinched nerve based on MRI ( I do not have these results)- however since the fall the pain has increased.  Pt walks with a walker at baseline.  xrays reassuring.  However due to hesitatant gait (pt bears weight but walks with a shuffling gait) so CT hip obtained- no fracture identified.   I have low suspicioin for occult fracture but d/w patient and granddaughter that he may need MRI to fully elucidate his hip pain.  Given hydrocodone for discomfort.  Pt to arrange for f/u with his PMD and also given f/u information for orthopedcics.  Pt to continue walking with his walker which is his baseline.   Discharged with strict return precautions.  Pt agreeable with plan.    Ethelda Chick, MD 05/29/13 [phone]

## 2013-05-29 NOTE — ED Notes (Addendum)
Pt was able to stand with assistance and hold the back of a chair alone. Shuffles his feet to move a step forward then needs to sit. This is new since his fall on Thursday per granddaughter. States his legs ache at night and he uses rubbing alcohol on his legs for relief.

## 2013-05-29 NOTE — ED Notes (Signed)
Pt is non ambulatory at home her caregiver. He has upper body strength but he uses a walker for assistance walking.

## 2014-11-22 ENCOUNTER — Emergency Department (HOSPITAL_COMMUNITY): Payer: Medicare HMO

## 2014-11-22 ENCOUNTER — Encounter (HOSPITAL_COMMUNITY): Payer: Self-pay | Admitting: *Deleted

## 2014-11-22 ENCOUNTER — Emergency Department (HOSPITAL_COMMUNITY)
Admission: EM | Admit: 2014-11-22 | Discharge: 2014-11-22 | Disposition: A | Discharge status: Home / Self Care | Payer: Medicare HMO | Attending: Emergency Medicine | Admitting: Emergency Medicine

## 2014-11-22 DIAGNOSIS — Z8701 Personal history of pneumonia (recurrent): Secondary | ICD-10-CM | POA: Diagnosis not present

## 2014-11-22 DIAGNOSIS — W07XXXA Fall from chair, initial encounter: Secondary | ICD-10-CM | POA: Diagnosis not present

## 2014-11-22 DIAGNOSIS — Y929 Unspecified place or not applicable: Secondary | ICD-10-CM | POA: Diagnosis not present

## 2014-11-22 DIAGNOSIS — E119 Type 2 diabetes mellitus without complications: Secondary | ICD-10-CM | POA: Diagnosis not present

## 2014-11-22 DIAGNOSIS — Z8673 Personal history of transient ischemic attack (TIA), and cerebral infarction without residual deficits: Secondary | ICD-10-CM | POA: Insufficient documentation

## 2014-11-22 DIAGNOSIS — S8991XA Unspecified injury of right lower leg, initial encounter: Secondary | ICD-10-CM | POA: Diagnosis not present

## 2014-11-22 DIAGNOSIS — Y939 Activity, unspecified: Secondary | ICD-10-CM | POA: Diagnosis not present

## 2014-11-22 DIAGNOSIS — Y999 Unspecified external cause status: Secondary | ICD-10-CM | POA: Diagnosis not present

## 2014-11-22 DIAGNOSIS — M1990 Unspecified osteoarthritis, unspecified site: Secondary | ICD-10-CM | POA: Insufficient documentation

## 2014-11-22 DIAGNOSIS — I10 Essential (primary) hypertension: Secondary | ICD-10-CM | POA: Diagnosis not present

## 2014-11-22 DIAGNOSIS — S0990XA Unspecified injury of head, initial encounter: Secondary | ICD-10-CM | POA: Diagnosis present

## 2014-11-22 DIAGNOSIS — K219 Gastro-esophageal reflux disease without esophagitis: Secondary | ICD-10-CM | POA: Diagnosis not present

## 2014-11-22 DIAGNOSIS — S0003XA Contusion of scalp, initial encounter: Secondary | ICD-10-CM

## 2014-11-22 DIAGNOSIS — Z79899 Other long term (current) drug therapy: Secondary | ICD-10-CM | POA: Insufficient documentation

## 2014-11-22 DIAGNOSIS — Z880 Allergy status to penicillin: Secondary | ICD-10-CM | POA: Diagnosis not present

## 2014-11-22 DIAGNOSIS — M25561 Pain in right knee: Secondary | ICD-10-CM

## 2014-11-22 NOTE — ED Notes (Signed)
Patient transported to CT 

## 2014-11-22 NOTE — ED Provider Notes (Signed)
Pt with fall backwards from sitting position in WC that tipped.  CT per my read shows no acute hemorrhage, or skull fx.  Formal report, and C-spine pending.  Rolland PorterMark Loyd Marhefka, MD 11/22/14 2259

## 2014-11-22 NOTE — ED Provider Notes (Signed)
CSN: [id number]     Arrival date & time 11/22/14  2202 History   First MD Initiated Contact with Patient 11/22/14 2206     Chief Complaint  Patient presents with  . Fall    (Consider location/radiation/quality/duration/timing/severity/associated sxs/prior Treatment) HPI Comments: Patient is an 81 year old male with a history of hypertension, diabetes mellitus, and CVA. He is on chronic blood thinners. He is wheelchair bound following his CVA and presents to the ED for evaluation following a fall. Per EMS, patient's granddaughter was attempting to lift his legs when his wheelchair flipped backwards causing the back of his head to hit a concrete floor. Patient denies any headache at this time. No reports of loss of consciousness. Patient states that he has some mild discomfort in his right knee as his knee was hyperflexed in the process. He has no complaints of neck pain or vision changes. No numbness or weakness which is new since the fall. No N/V.  Patient is a 81 y.o. male presenting with fall. The history is provided by the patient and the EMS personnel. No language interpreter was used.  Fall Associated symptoms include arthralgias. Pertinent negatives include no headaches, nausea, numbness, vomiting or weakness.    Past Medical History  Diagnosis Date  . Hypertension   . Diabetes mellitus     oral meds only  . Pneumonia     20 years ago .  Marland Kitchen GERD (gastroesophageal reflux disease)   . Arthritis     ankles   Past Surgical History  Procedure Laterality Date  . Tonsillectomy    . 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   Marland Kitchen Cataract extraction w/phaco  04/26/2012    Procedure: CATARACT EXTRACTION PHACO AND INTRAOCULAR LENS PLACEMENT (IOC);  Surgeon: Chalmers Guest, MD;  Location: [facility] OR;  Service: Ophthalmology;  Laterality: Right;   History reviewed. No pertinent family history. History  Substance Use Topics  . Smoking status: Never Smoker   . Smokeless tobacco: Not on  file  . Alcohol Use: No    Review of Systems  Eyes: Negative for visual disturbance.  Gastrointestinal: Negative for nausea and vomiting.  Musculoskeletal: Positive for arthralgias.  Neurological: Negative for weakness, numbness and headaches.  All other systems reviewed and are negative.   Allergies  Morphine and related; Other; Gabapentin; and Penicillins  Home Medications   Prior to Admission medications   Medication Sig Start Date End Date Taking? Authorizing Provider  acetaminophen (TYLENOL) 500 MG tablet Take 500 mg by mouth every morning.   Yes Historical Provider, MD  apixaban (ELIQUIS) 2.5 MG TABS tablet Take 2.5 mg by mouth 2 (two) times daily.   Yes Historical Provider, MD  cholecalciferol (VITAMIN D) 1000 UNITS tablet Take 1,000 Units by mouth at bedtime.    Yes Historical Provider, MD  ferrous sulfate 325 (65 FE) MG EC tablet Take 325 mg by mouth daily with breakfast.   Yes Historical Provider, MD  glipiZIDE (GLUCOTROL) 10 MG tablet Take 10 mg by mouth 2 (two) times daily before a meal.   Yes Historical Provider, MD  Glucosamine-Chondroitin 750-600 MG TABS Take 1 tablet by mouth at bedtime.   Yes Historical Provider, MD  Lactobacillus Rhamnosus, GG, (CULTURELLE) CAPS Take 1 capsule by mouth daily.   Yes Historical Provider, MD  losartan (COZAAR) 25 MG tablet Take 12.5 mg by mouth every morning.    Yes Historical Provider, MD  Magnesium 250 MG TABS Take 250 mg by mouth  at bedtime.   Yes Historical Provider, MD  Misc Natural Products (TURMERIC CURCUMIN) CAPS Take 1 capsule by mouth as needed (FOR INFLAMMATION).   Yes Historical Provider, MD  omeprazole (PRILOSEC) 20 MG capsule Take 20 mg by mouth daily.   Yes Historical Provider, MD  simvastatin (ZOCOR) 40 MG tablet Take 20 mg by mouth daily at 6 PM.   Yes Historical Provider, MD  trolamine salicylate (ASPERCREME) 10 % cream Apply 1 application topically 2 (two) times daily as needed (FOR KNEES).   Yes Historical Provider,  MD  HYDROcodone-acetaminophen (NORCO) 5-325 MG per tablet Take 2 tablets by mouth every 4 (four) hours as needed for pain. 04/25/13   Geoffery Lyons, MD  HYDROcodone-acetaminophen (NORCO/VICODIN) 5-325 MG per tablet Take 1 tablet by mouth every 4 (four) hours as needed for pain. 05/29/13   Jerelyn Scott, MD  Menthol-Zinc Oxide 0.44-20.6 % OINT Apply 1 application topically as needed (FOR SKIN BREAKDOWNS).    Historical Provider, MD  Multiple Vitamin (MULTIVITAMIN WITH MINERALS) TABS Take 1 tablet by mouth 2 (two) times a week.    Historical Provider, MD   BP 153/51 mmHg  Pulse 67  Temp(Src) 98.1 F (36.7 C) (Oral)  Resp 16  SpO2 99%   Physical Exam  Constitutional: He is oriented to person, place, and time. He appears well-developed and well-nourished. No distress.  Patient is pleasant and well-appearing.  HENT:  Head: Normocephalic and atraumatic.  Head appears atraumatic. No skull instability. No Battle sign or raccoons eyes. No abrasion or laceration to the scalp.  Eyes: Conjunctivae and EOM are normal. No scleral icterus.  PERRL  Neck: Normal range of motion.  Pulmonary/Chest: Effort normal. No respiratory distress.  Musculoskeletal: Normal range of motion.       Right knee: He exhibits normal range of motion, no effusion, no LCL laxity and no MCL laxity. Tenderness found.  Neurological: He is alert and oriented to person, place, and time.  GCS 15. Speech is goal oriented. Neurologic status at baseline, per patient.  Skin: Skin is warm and dry. No rash noted. He is not diaphoretic. No erythema. No pallor.  Psychiatric: He has a normal mood and affect. His behavior is normal.  Nursing note and vitals reviewed.   ED Course  Procedures (including critical care time) Labs Review Labs Reviewed - No data to display  Imaging Review Ct Head Wo Contrast  11/22/2014   CLINICAL DATA:  Initial evaluation for acute trauma, fall.  EXAM: CT HEAD WITHOUT CONTRAST  CT CERVICAL SPINE WITHOUT  CONTRAST  TECHNIQUE: Multidetector CT imaging of the head and cervical spine was performed following the standard protocol without intravenous contrast. Multiplanar CT image reconstructions of the cervical spine were also generated.  COMPARISON:  None.  FINDINGS: CT HEAD FINDINGS  Generalized cerebral atrophy with chronic microvascular ischemic changes present.  There is no acute intracranial hemorrhage or infarct. No mass lesion or midline shift. Gray-white matter differentiation is well maintained. Ventricles are normal in size without evidence of hydrocephalus. CSF containing spaces are within normal limits. No extra-axial fluid collection.  The calvarium is intact.  Orbital soft tissues are within normal limits.  The paranasal sinuses and mastoid air cells are well pneumatized and free of fluid.  Small posterior scalp contusion present.  CT CERVICAL SPINE FINDINGS  There is straightening of the normal cervical lordosis. Vertebral body heights are preserved. Normal C1-2 articulations are intact. No prevertebral soft tissue swelling. No acute fracture or listhesis.  Moderate multilevel degenerative disc  disease is evidenced by intervertebral disc space narrowing, endplate sclerosis, and osteophytosis, most prevalent at C6-7.  Visualized soft tissues of the neck are within normal limits. Visualized lung apices are clear without evidence of apical pneumothorax.  IMPRESSION: CT BRAIN:  1. No acute intracranial process. 2. Small posterior scalp contusion. 3. Generalized cerebral atrophy with chronic microvascular ischemic disease.  CT CERVICAL SPINE:  No acute traumatic injury within the cervical spine.   Electronically Signed   By: Rise Mu M.D.   On: 11/22/2014 23:09   Ct Cervical Spine Wo Contrast  11/22/2014   CLINICAL DATA:  Initial evaluation for acute trauma, fall.  EXAM: CT HEAD WITHOUT CONTRAST  CT CERVICAL SPINE WITHOUT CONTRAST  TECHNIQUE: Multidetector CT imaging of the head and cervical  spine was performed following the standard protocol without intravenous contrast. Multiplanar CT image reconstructions of the cervical spine were also generated.  COMPARISON:  None.  FINDINGS: CT HEAD FINDINGS  Generalized cerebral atrophy with chronic microvascular ischemic changes present.  There is no acute intracranial hemorrhage or infarct. No mass lesion or midline shift. Gray-white matter differentiation is well maintained. Ventricles are normal in size without evidence of hydrocephalus. CSF containing spaces are within normal limits. No extra-axial fluid collection.  The calvarium is intact.  Orbital soft tissues are within normal limits.  The paranasal sinuses and mastoid air cells are well pneumatized and free of fluid.  Small posterior scalp contusion present.  CT CERVICAL SPINE FINDINGS  There is straightening of the normal cervical lordosis. Vertebral body heights are preserved. Normal C1-2 articulations are intact. No prevertebral soft tissue swelling. No acute fracture or listhesis.  Moderate multilevel degenerative disc disease is evidenced by intervertebral disc space narrowing, endplate sclerosis, and osteophytosis, most prevalent at C6-7.  Visualized soft tissues of the neck are within normal limits. Visualized lung apices are clear without evidence of apical pneumothorax.  IMPRESSION: CT BRAIN:  1. No acute intracranial process. 2. Small posterior scalp contusion. 3. Generalized cerebral atrophy with chronic microvascular ischemic disease.  CT CERVICAL SPINE:  No acute traumatic injury within the cervical spine.   Electronically Signed   By: Rise Mu M.D.   On: 11/22/2014 23:09   Dg Knee Complete 4 Views Right  11/22/2014   CLINICAL DATA:  The patient's right leg fell when wheelchair slid backward, with right knee pain and edema. Initial encounter.  EXAM: RIGHT KNEE - COMPLETE 4+ VIEW  COMPARISON:  None.  FINDINGS: There is no evidence of fracture or dislocation. The joint spaces  are preserved. No significant degenerative change is seen; the patellofemoral joint is grossly unremarkable in appearance.  Trace knee joint fluid remains within normal limits. Scattered vascular calcifications are seen.  IMPRESSION: No evidence of fracture or dislocation.   Electronically Signed   By: Roanna Raider M.D.   On: 11/22/2014 23:05     EKG Interpretation None      MDM   Final diagnoses:  Knee pain, acute, right  Scalp contusion, initial encounter    81 year old male who is wheelchair bound following CVA presents to the emergency department for further evaluation of injuries following a fall. No loss of consciousness. Patient is neurologically at baseline. Imaging today is negative for acute findings. No evidence of skull fracture or intracranial hemorrhage. No changes to the cervical spine. Right x-ray is negative for fracture or dislocation.  No indication for further emergent workup. Patient to be discharged home with instruction to return if symptoms worsen. Return precautions discussed  and provided. Patient and family agreeable to plan with no unaddressed concerns. Patient discharged in good condition.   Filed Vitals:   11/22/14 2213 11/22/14 2330  BP: 153/51 161/87  Pulse: 67 68  Temp: 98.1 F (36.7 C) 98.5 F (36.9 C)  TempSrc: Oral Oral  Resp: 16 20  SpO2: 99% 100%     Antony MaduraKelly Idriss Quackenbush, PA-C 11/22/14 2342  Rolland PorterMark James, MD 11/23/14 [phone]

## 2014-11-22 NOTE — ED Notes (Signed)
PTAR CALLED @2325 .

## 2014-11-22 NOTE — Discharge Instructions (Signed)
Facial or Scalp Contusion A facial or scalp contusion is a deep bruise on the face or head. Injuries to the face and head generally cause a lot of swelling, especially around the eyes. Contusions are the result of an injury that caused bleeding under the skin. The contusion may turn blue, purple, or yellow. Minor injuries will give you a painless contusion, but more severe contusions may stay painful and swollen for a few weeks.  CAUSES  A facial or scalp contusion is caused by a blunt injury or trauma to the face or head area.  SIGNS AND SYMPTOMS   Swelling of the injured area.   Discoloration of the injured area.   Tenderness, soreness, or pain in the injured area.  DIAGNOSIS  The diagnosis can be made by taking a medical history and doing a physical exam. An X-ray exam, CT scan, or MRI may be needed to determine if there are any associated injuries, such as broken bones (fractures). TREATMENT  Often, the best treatment for a facial or scalp contusion is applying cold compresses to the injured area. Over-the-counter medicines may also be recommended for pain control.  HOME CARE INSTRUCTIONS   Only take over-the-counter or prescription medicines as directed by your health care provider.   Apply ice to the injured area.   Put ice in a plastic bag.   Place a towel between your skin and the bag.   Leave the ice on for 20 minutes, 2-3 times a day.  SEEK MEDICAL CARE IF:  You have bite problems.   You have pain with chewing.   You are concerned about facial defects. SEEK IMMEDIATE MEDICAL CARE IF:  You have severe pain or a headache that is not relieved by medicine.   You have unusual sleepiness, confusion, or personality changes.   You throw up (vomit).   You have a persistent nosebleed.   You have double vision or blurred vision.   You have fluid drainage from your nose or ear.   You have difficulty walking or using your arms or legs.  MAKE SURE YOU:    Understand these instructions.  Will watch your condition.  Will get help right away if you are not doing well or get worse. Document Released: 08/26/2004 Document Revised: 05/09/2013 Document Reviewed: 03/01/2013 [employer] Patient Information 2015 [employer]. This information is not intended to replace advice given to you by your health care provider. Make sure you discuss any questions you have with your health care provider.  

## 2014-11-22 NOTE — ED Notes (Signed)
Pt in via EMS, pt is wheelchair bound after a CVA, tonight his granddaughter was trying to lift his legs and his wheelchair flipped backwards. Pt hit his head on a finished concrete floor, denies LOC, pt is on blood thinners. Pt denies complaints on arrival, no tenderness to back of head or neck, alert and oriented. CBG 166 for EMS, VSS during transport.

## 2014-12-13 ENCOUNTER — Emergency Department (HOSPITAL_COMMUNITY): Payer: Medicare HMO

## 2014-12-13 ENCOUNTER — Encounter (HOSPITAL_COMMUNITY): Payer: Self-pay | Admitting: Emergency Medicine

## 2014-12-13 ENCOUNTER — Emergency Department (HOSPITAL_COMMUNITY)
Admission: EM | Admit: 2014-12-13 | Discharge: 2014-12-13 | Disposition: A | Discharge status: Home / Self Care | Payer: Medicare HMO | Attending: Emergency Medicine | Admitting: Emergency Medicine

## 2014-12-13 DIAGNOSIS — R202 Paresthesia of skin: Secondary | ICD-10-CM | POA: Insufficient documentation

## 2014-12-13 DIAGNOSIS — M1990 Unspecified osteoarthritis, unspecified site: Secondary | ICD-10-CM | POA: Insufficient documentation

## 2014-12-13 DIAGNOSIS — Z7902 Long term (current) use of antithrombotics/antiplatelets: Secondary | ICD-10-CM | POA: Insufficient documentation

## 2014-12-13 DIAGNOSIS — Z792 Long term (current) use of antibiotics: Secondary | ICD-10-CM | POA: Diagnosis not present

## 2014-12-13 DIAGNOSIS — I10 Essential (primary) hypertension: Secondary | ICD-10-CM | POA: Diagnosis not present

## 2014-12-13 DIAGNOSIS — Z79899 Other long term (current) drug therapy: Secondary | ICD-10-CM | POA: Insufficient documentation

## 2014-12-13 DIAGNOSIS — Z8673 Personal history of transient ischemic attack (TIA), and cerebral infarction without residual deficits: Secondary | ICD-10-CM | POA: Diagnosis not present

## 2014-12-13 DIAGNOSIS — E119 Type 2 diabetes mellitus without complications: Secondary | ICD-10-CM | POA: Insufficient documentation

## 2014-12-13 DIAGNOSIS — K219 Gastro-esophageal reflux disease without esophagitis: Secondary | ICD-10-CM | POA: Diagnosis not present

## 2014-12-13 DIAGNOSIS — Z880 Allergy status to penicillin: Secondary | ICD-10-CM | POA: Diagnosis not present

## 2014-12-13 DIAGNOSIS — R200 Anesthesia of skin: Secondary | ICD-10-CM | POA: Insufficient documentation

## 2014-12-13 DIAGNOSIS — Z8701 Personal history of pneumonia (recurrent): Secondary | ICD-10-CM | POA: Insufficient documentation

## 2014-12-13 HISTORY — DX: Cerebral infarction, unspecified: I63.9

## 2014-12-13 LAB — COMPREHENSIVE METABOLIC PANEL
ALK PHOS: 66 U/L (ref 38–126)
ALT: 10 U/L — ABNORMAL LOW (ref 17–63)
AST: 18 U/L (ref 15–41)
Albumin: 3.5 g/dL (ref 3.5–5.0)
Anion gap: 9 (ref 5–15)
BUN: 8 mg/dL (ref 6–20)
CALCIUM: 9.6 mg/dL (ref 8.9–10.3)
CO2: 27 mmol/L (ref 22–32)
Chloride: 102 mmol/L (ref 101–111)
Creatinine, Ser: 0.87 mg/dL (ref 0.61–1.24)
GFR calc non Af Amer: >60 mL/min (ref >60)
Glucose, Bld: 130 mg/dL — ABNORMAL HIGH (ref 65–99)
Potassium: 3.6 mmol/L (ref 3.5–5.1)
Sodium: 138 mmol/L (ref 135–145)
Total Bilirubin: 0.3 mg/dL (ref 0.3–1.2)
Total Protein: 7 g/dL (ref 6.5–8.1)

## 2014-12-13 LAB — URINALYSIS, ROUTINE W REFLEX MICROSCOPIC
Bilirubin Urine: NEGATIVE
Glucose, UA: NEGATIVE mg/dL
Hgb urine dipstick: NEGATIVE
Ketones, ur: NEGATIVE mg/dL
Leukocytes, UA: NEGATIVE
Nitrite: NEGATIVE
PROTEIN: NEGATIVE mg/dL
Specific Gravity, Urine: 1.004 — ABNORMAL LOW (ref 1.005–1.030)
Urobilinogen, UA: 0.2 mg/dL (ref 0.0–1.0)
pH: 6.5 (ref 5.0–8.0)

## 2014-12-13 LAB — CBC
HEMATOCRIT: 37 % — AB (ref 39.0–52.0)
Hemoglobin: 12 g/dL — ABNORMAL LOW (ref 13.0–17.0)
MCH: 26.3 pg (ref 26.0–34.0)
MCHC: 32.4 g/dL (ref 30.0–36.0)
MCV: 81.1 fL (ref 78.0–100.0)
Platelets: 211 10*3/uL (ref 150–400)
RBC: 4.56 MIL/uL (ref 4.22–5.81)
RDW: 15 % (ref 11.5–15.5)
WBC: 9 10*3/uL (ref 4.0–10.5)

## 2014-12-13 LAB — I-STAT TROPONIN, ED: TROPONIN I, POC: 0 ng/mL (ref 0.00–0.08)

## 2014-12-13 NOTE — ED Provider Notes (Signed)
CSN: [id number]     Arrival date & time 12/13/14  1803 History   First MD Initiated Contact with Patient 12/13/14 1807     Chief Complaint  Patient presents with  . Numbness     (Consider location/radiation/quality/duration/timing/severity/associated sxs/prior Treatment) The history is provided by the patient.  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 Symptoms persists, constant. No specific exacerbating or alleviating factors. Denies any new weakness or loss of his baseline functional activity. No change in speech or vision. Denies headaches. No neck or back pain. Denies change in meds or new meds. Normal appetite. No fever or chills. States otherwise feels well, at baseline.       Past Medical History  Diagnosis Date  . Hypertension   . Diabetes mellitus     oral meds only  . Pneumonia     20 years ago .  Marland Kitchen GERD (gastroesophageal reflux disease)   . Arthritis     ankles  . Stroke    Past Surgical History  Procedure Laterality Date  . Tonsillectomy    . 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   Marland Kitchen Cataract extraction w/phaco  04/26/2012    Procedure: CATARACT EXTRACTION PHACO AND INTRAOCULAR LENS PLACEMENT (IOC);  Surgeon: Chalmers Guest, MD;  Location: [facility] OR;  Service: Ophthalmology;  Laterality: Right;   History reviewed. No pertinent family history. History  Substance Use Topics  . Smoking status: Never Smoker   . Smokeless tobacco: Not on file  . Alcohol Use: No    Review of Systems  Constitutional: Negative for fever and chills.  HENT: Negative for sore throat and trouble swallowing.   Eyes: Negative for visual disturbance.  Respiratory: Negative for shortness of breath.   Cardiovascular: Negative for chest pain.  Gastrointestinal: Negative for vomiting, abdominal pain and diarrhea.  Endocrine: Negative for polyuria.  Genitourinary:  Negative for dysuria and flank pain.  Musculoskeletal: Negative for back pain and neck pain.  Skin: Negative for rash.  Neurological: Positive for numbness. Negative for speech difficulty, weakness and headaches.  Hematological: Does not bruise/bleed easily.  Psychiatric/Behavioral: Negative for confusion.      Allergies  Morphine and related; Other; Gabapentin; and Penicillins  Home Medications   Prior to Admission medications   Medication Sig Start Date End Date Taking? Authorizing Provider  acetaminophen (TYLENOL) 500 MG tablet Take 500 mg by mouth every morning.    Historical Provider, MD  apixaban (ELIQUIS) 2.5 MG TABS tablet Take 2.5 mg by mouth 2 (two) times daily.    Historical Provider, MD  cholecalciferol (VITAMIN D) 1000 UNITS tablet Take 1,000 Units by mouth at bedtime.     Historical Provider, MD  ferrous sulfate 325 (65 FE) MG EC tablet Take 325 mg by mouth daily with breakfast.    Historical Provider, MD  glipiZIDE (GLUCOTROL) 10 MG tablet Take 10 mg by mouth 2 (two) times daily before a meal.    Historical Provider, MD  Glucosamine-Chondroitin 750-600 MG TABS Take 1 tablet by mouth at bedtime.    Historical Provider, MD  HYDROcodone-acetaminophen (NORCO) 5-325 MG per tablet Take 2 tablets by mouth every 4 (four) hours as needed for pain. 04/25/13   Geoffery Lyons, MD  HYDROcodone-acetaminophen (NORCO/VICODIN) 5-325 MG per tablet Take 1 tablet by mouth every 4 (four) hours as needed for pain. 05/29/13   Jerelyn Scott, MD  Lactobacillus Rhamnosus,  GG, (CULTURELLE) CAPS Take 1 capsule by mouth daily.    Historical Provider, MD  losartan (COZAAR) 25 MG tablet Take 12.5 mg by mouth every morning.     Historical Provider, MD  Magnesium 250 MG TABS Take 250 mg by mouth at bedtime.    Historical Provider, MD  Menthol-Zinc Oxide 0.44-20.6 % OINT Apply 1 application topically as needed (FOR SKIN BREAKDOWNS).    Historical Provider, MD  Misc Natural Products (TURMERIC CURCUMIN) CAPS  Take 1 capsule by mouth as needed (FOR INFLAMMATION).    Historical Provider, MD  Multiple Vitamin (MULTIVITAMIN WITH MINERALS) TABS Take 1 tablet by mouth 2 (two) times a week.    Historical Provider, MD  omeprazole (PRILOSEC) 20 MG capsule Take 20 mg by mouth daily.    Historical Provider, MD  simvastatin (ZOCOR) 40 MG tablet Take 20 mg by mouth daily at 6 PM.    Historical Provider, MD  trolamine salicylate (ASPERCREME) 10 % cream Apply 1 application topically 2 (two) times daily as needed (FOR KNEES).    Historical Provider, MD   BP 156/74 mmHg  Pulse 74  Temp(Src) 98.6 F (37 C) (Oral)  Resp 17  Ht  (1.88 m)  Wt 260 lb (117.935 kg)  BMI 33.37 kg/m2  SpO2 100% Physical Exam  Constitutional: He is oriented to person, place, and time. He appears well-developed and well-nourished. No distress.  HENT:  Mouth/Throat: Oropharynx is clear and moist.  Eyes: Conjunctivae are normal. No scleral icterus.  Neck: Neck supple. No tracheal deviation present. No thyromegaly present.  No bruits.   Cardiovascular: Normal rate, regular rhythm, normal heart sounds and intact distal pulses.   Pulmonary/Chest: Effort normal and breath sounds normal. No accessory muscle usage. No respiratory distress.  Abdominal: Soft. Bowel sounds are normal. He exhibits no distension. There is no tenderness.  Genitourinary:  No flank tenderness.   Musculoskeletal: Normal range of motion. He exhibits no edema or tenderness.  Neurological: He is alert and oriented to person, place, and time. No cranial nerve deficit.  Mild left sided weakness, pt indicates unchanged from baseline. Right motor 5/5, upper and lower ext. sens grossly intact. No pronator drift. No facial asymmetry. Speech clear/fluent.   Skin: Skin is warm and dry. No rash noted. He is not diaphoretic.  Psychiatric: He has a normal mood and affect.  Nursing note and vitals reviewed.   ED Course  Procedures (including critical care time) Labs  Review  Results for orders placed or performed during the hospital encounter of 12/13/14  CBC  Result Value Ref Range   WBC 9.0 4.0 - 10.5 K/uL   RBC 4.56 4.22 - 5.81 MIL/uL   Hemoglobin 12.0 (L) 13.0 - 17.0 g/dL   HCT 96.0 (L) 45.4 - 09.8 %   MCV 81.1 78.0 - 100.0 fL   MCH 26.3 26.0 - 34.0 pg   MCHC 32.4 30.0 - 36.0 g/dL   RDW 11.9 14.7 - 82.9 %   Platelets 211 150 - 400 K/uL  Comprehensive metabolic panel  Result Value Ref Range   Sodium 138 135 - 145 mmol/L   Potassium 3.6 3.5 - 5.1 mmol/L   Chloride 102 101 - 111 mmol/L   CO2 27 22 - 32 mmol/L   Glucose, Bld 130 (H) 65 - 99 mg/dL   BUN 8 6 - 20 mg/dL   Creatinine, Ser 5.62 0.61 - 1.24 mg/dL   Calcium 9.6 8.9 - 13.0 mg/dL   Total Protein 7.0 6.5 - 8.1  g/dL   Albumin 3.5 3.5 - 5.0 g/dL   AST 18 15 - 41 U/L   ALT 10 (L) 17 - 63 U/L   Alkaline Phosphatase 66 38 - 126 U/L   Total Bilirubin 0.3 0.3 - 1.2 mg/dL   GFR calc non Af Amer >60 >60 mL/min   GFR calc Af Amer >60 >60 mL/min   Anion gap 9 5 - 15  Urinalysis, Routine w reflex microscopic  Result Value Ref Range   Color, Urine YELLOW YELLOW   APPearance CLEAR CLEAR   Specific Gravity, Urine 1.004 (L) 1.005 - 1.030   pH 6.5 5.0 - 8.0   Glucose, UA NEGATIVE NEGATIVE mg/dL   Hgb urine dipstick NEGATIVE NEGATIVE   Bilirubin Urine NEGATIVE NEGATIVE   Ketones, ur NEGATIVE NEGATIVE mg/dL   Protein, ur NEGATIVE NEGATIVE mg/dL   Urobilinogen, UA 0.2 0.0 - 1.0 mg/dL   Nitrite NEGATIVE NEGATIVE   Leukocytes, UA NEGATIVE NEGATIVE  I-stat troponin, ED  Result Value Ref Range   Troponin i, poc 0.00 0.00 - 0.08 ng/mL   Comment 3           Ct Head Wo Contrast  12/13/2014   CLINICAL DATA:  Acute right leg and hand numbness.  EXAM: CT HEAD WITHOUT CONTRAST  TECHNIQUE: Contiguous axial images were obtained from the base of the skull through the vertex without intravenous contrast.  COMPARISON:  CT scan of November 22, 2014.  FINDINGS: Bony calvarium appears intact. Mild diffuse  cortical atrophy is noted. Mild chronic ischemic white matter disease is noted. No mass effect or midline shift is noted. Ventricular size is within normal limits. There is no evidence of mass lesion, hemorrhage or acute infarction.  IMPRESSION: Mild diffuse cortical atrophy. Mild chronic ischemic white matter disease. No acute intracranial abnormality seen.   Electronically Signed   By: Lupita RaiderJames  Green Jr, M.D.   On: 12/13/2014 19:45   Ct Head Wo Contrast  11/22/2014   CLINICAL DATA:  Initial evaluation for acute trauma, fall.  EXAM: CT HEAD WITHOUT CONTRAST  CT CERVICAL SPINE WITHOUT CONTRAST  TECHNIQUE: Multidetector CT imaging of the head and cervical spine was performed following the standard protocol without intravenous contrast. Multiplanar CT image reconstructions of the cervical spine were also generated.  COMPARISON:  None.  FINDINGS: CT HEAD FINDINGS  Generalized cerebral atrophy with chronic microvascular ischemic changes present.  There is no acute intracranial hemorrhage or infarct. No mass lesion or midline shift. Gray-white matter differentiation is well maintained. Ventricles are normal in size without evidence of hydrocephalus. CSF containing spaces are within normal limits. No extra-axial fluid collection.  The calvarium is intact.  Orbital soft tissues are within normal limits.  The paranasal sinuses and mastoid air cells are well pneumatized and free of fluid.  Small posterior scalp contusion present.  CT CERVICAL SPINE FINDINGS  There is straightening of the normal cervical lordosis. Vertebral body heights are preserved. Normal C1-2 articulations are intact. No prevertebral soft tissue swelling. No acute fracture or listhesis.  Moderate multilevel degenerative disc disease is evidenced by intervertebral disc space narrowing, endplate sclerosis, and osteophytosis, most prevalent at C6-7.  Visualized soft tissues of the neck are within normal limits. Visualized lung apices are clear without  evidence of apical pneumothorax.  IMPRESSION: CT BRAIN:  1. No acute intracranial process. 2. Small posterior scalp contusion. 3. Generalized cerebral atrophy with chronic microvascular ischemic disease.  CT CERVICAL SPINE:  No acute traumatic injury within the cervical spine.  Electronically Signed   By: Rise MuBenjamin  McClintock M.D.   On: 11/22/2014 23:09   Ct Cervical Spine Wo Contrast  11/22/2014   CLINICAL DATA:  Initial evaluation for acute trauma, fall.  EXAM: CT HEAD WITHOUT CONTRAST  CT CERVICAL SPINE WITHOUT CONTRAST  TECHNIQUE: Multidetector CT imaging of the head and cervical spine was performed following the standard protocol without intravenous contrast. Multiplanar CT image reconstructions of the cervical spine were also generated.  COMPARISON:  None.  FINDINGS: CT HEAD FINDINGS  Generalized cerebral atrophy with chronic microvascular ischemic changes present.  There is no acute intracranial hemorrhage or infarct. No mass lesion or midline shift. Gray-white matter differentiation is well maintained. Ventricles are normal in size without evidence of hydrocephalus. CSF containing spaces are within normal limits. No extra-axial fluid collection.  The calvarium is intact.  Orbital soft tissues are within normal limits.  The paranasal sinuses and mastoid air cells are well pneumatized and free of fluid.  Small posterior scalp contusion present.  CT CERVICAL SPINE FINDINGS  There is straightening of the normal cervical lordosis. Vertebral body heights are preserved. Normal C1-2 articulations are intact. No prevertebral soft tissue swelling. No acute fracture or listhesis.  Moderate multilevel degenerative disc disease is evidenced by intervertebral disc space narrowing, endplate sclerosis, and osteophytosis, most prevalent at C6-7.  Visualized soft tissues of the neck are within normal limits. Visualized lung apices are clear without evidence of apical pneumothorax.  IMPRESSION: CT BRAIN:  1. No acute  intracranial process. 2. Small posterior scalp contusion. 3. Generalized cerebral atrophy with chronic microvascular ischemic disease.  CT CERVICAL SPINE:  No acute traumatic injury within the cervical spine.   Electronically Signed   By: Rise MuBenjamin  McClintock M.D.   On: 11/22/2014 23:09   Dg Knee Complete 4 Views Right  11/22/2014   CLINICAL DATA:  The patient's right leg fell when wheelchair slid backward, with right knee pain and edema. Initial encounter.  EXAM: RIGHT KNEE - COMPLETE 4+ VIEW  COMPARISON:  None.  FINDINGS: There is no evidence of fracture or dislocation. The joint spaces are preserved. No significant degenerative change is seen; the patellofemoral joint is grossly unremarkable in appearance.  Trace knee joint fluid remains within normal limits. Scattered vascular calcifications are seen.  IMPRESSION: No evidence of fracture or dislocation.   Electronically Signed   By: Roanna RaiderJeffery  Chang M.D.   On: 11/22/2014 23:05       EKG Interpretation   Date/Time:  Friday Dec 13 2014 18:14:14 EDT Ventricular Rate:  66 PR Interval:  212 QRS Duration: 115 QT Interval:  409 QTC Calculation: 428 R Axis:   -42 Text Interpretation:  Sinus rhythm Incomplete left bundle branch block  Nonspecific ST abnormality No significant change since last tracing  Confirmed by Denton LankSTEINL  MD, Caryn BeeKEVIN ([id number]) on 12/13/2014 6:25:47 PM      MDM   Iv ns. Labs. Monitor.   Ct.  Reviewed nursing notes and prior charts for additional history.   Recheck, no unilateral numbness or weakness. Family notes pts mental status, speech, functional ability at baseline. No new/focal deficits on exam. Afeb.  On recheck pt states he feels fine, and ready for d/c.  Discussed close pcp f/u.   Return precautions provided.  Pt currently asymptomatic, at baseline, and appears stable for d/c.      Cathren LaineKevin Ronald Vinsant, MD 12/13/14 2053

## 2014-12-13 NOTE — Discharge Instructions (Signed)
It was our pleasure to provide your ER care today - we hope that you feel better.  Follow up with your primary care doctor in coming week.  Return to ER right away if worse, new symptoms, fevers, weak/faint, one-sided numbness or weakness, change in speech or vision, other concern.      Paresthesia Paresthesia is an abnormal burning or prickling sensation. This sensation is generally felt in the hands, arms, legs, or feet. However, it may occur in any part of the body. It is usually not painful. The feeling may be described as:  Tingling or numbness.  "Pins and needles."  Skin crawling.  Buzzing.  Limbs "falling asleep."  Itching. Most people experience temporary (transient) paresthesia at some time in their lives. CAUSES  Paresthesia may occur when you breathe too quickly (hyperventilation). It can also occur without any apparent cause. Commonly, paresthesia occurs when pressure is placed on a nerve. The feeling quickly goes away once the pressure is removed. For some people, however, paresthesia is a long-lasting (chronic) condition caused by an underlying disorder. The underlying disorder may be:  A traumatic, direct injury to nerves. Examples include a:  Broken (fractured) neck.  Fractured skull.  A disorder affecting the brain and spinal cord (central nervous system). Examples include:  Transverse myelitis.  Encephalitis.  Transient ischemic attack.  Multiple sclerosis.  Stroke.  Tumor or blood vessel problems, such as an arteriovenous malformation pressing against the brain or spinal cord.  A condition that damages the peripheral nerves (peripheral neuropathy). Peripheral nerves are not part of the brain and spinal cord. These conditions include:  Diabetes.  Peripheral vascular disease.  Nerve entrapment syndromes, such as carpal tunnel syndrome.  Shingles.  Hypothyroidism.  Vitamin B12 deficiencies.  Alcoholism.  Heavy metal poisoning (lead,  arsenic).  Rheumatoid arthritis.  Systemic lupus erythematosus. DIAGNOSIS  Your caregiver will attempt to find the underlying cause of your paresthesia. Your caregiver may:  Take your medical history.  Perform a physical exam.  Order various lab tests.  Order imaging tests. TREATMENT  Treatment for paresthesia depends on the underlying cause. HOME CARE INSTRUCTIONS  Avoid drinking alcohol.  You may consider massage or acupuncture to help relieve your symptoms.  Keep all follow-up appointments as directed by your caregiver. SEEK IMMEDIATE MEDICAL CARE IF:   You feel weak.  You have trouble walking or moving.  You have problems with speech or vision.  You feel confused.  You cannot control your bladder or bowel movements.  You feel numbness after an injury.  You faint.  Your burning or prickling feeling gets worse when walking.  You have pain, cramps, or dizziness.  You develop a rash. MAKE SURE YOU:  Understand these instructions.  Will watch your condition.  Will get help right away if you are not doing well or get worse. Document Released: 07/09/2002 Document Revised: 10/11/2011 Document Reviewed: 04/09/2011 [facility] Patient Information 2015 [place], [place]. This information is not intended to replace advice given to you by your health care provider. Make sure you discuss any questions you have with your health care provider.

## 2014-12-13 NOTE — ED Notes (Signed)
Pt having right leg/hand numbness around 11am. Pt has hx of stroke in 2014. Pt has left sided deficits. Pt's speech is at baseline per pt's family. Pt has pinched nerve in lower back according to family. Pt able to move right arm/hand. Good grips. BP 132/56, CBG 173, HR 2nd degree HB 70's. Pt alert and oriented.

## 2014-12-13 NOTE — ED Notes (Signed)
PTAR CALL @ 2105.

## 2016-08-28 ENCOUNTER — Encounter (HOSPITAL_COMMUNITY): Payer: Self-pay | Admitting: Emergency Medicine

## 2016-08-28 ENCOUNTER — Observation Stay (HOSPITAL_COMMUNITY)
Admission: EM | Admit: 2016-08-28 | Discharge: 2016-08-30 | Disposition: A | Discharge status: Home / Self Care | Payer: Medicare HMO | Attending: Internal Medicine | Admitting: Internal Medicine

## 2016-08-28 DIAGNOSIS — J101 Influenza due to other identified influenza virus with other respiratory manifestations: Secondary | ICD-10-CM | POA: Diagnosis not present

## 2016-08-28 DIAGNOSIS — Z79899 Other long term (current) drug therapy: Secondary | ICD-10-CM | POA: Insufficient documentation

## 2016-08-28 DIAGNOSIS — E785 Hyperlipidemia, unspecified: Secondary | ICD-10-CM

## 2016-08-28 DIAGNOSIS — Z7401 Bed confinement status: Secondary | ICD-10-CM | POA: Diagnosis not present

## 2016-08-28 DIAGNOSIS — Z794 Long term (current) use of insulin: Secondary | ICD-10-CM | POA: Insufficient documentation

## 2016-08-28 DIAGNOSIS — R6889 Other general symptoms and signs: Secondary | ICD-10-CM | POA: Diagnosis present

## 2016-08-28 DIAGNOSIS — Z9849 Cataract extraction status, unspecified eye: Secondary | ICD-10-CM | POA: Diagnosis not present

## 2016-08-28 DIAGNOSIS — Z888 Allergy status to other drugs, medicaments and biological substances status: Secondary | ICD-10-CM | POA: Diagnosis not present

## 2016-08-28 DIAGNOSIS — I69354 Hemiplegia and hemiparesis following cerebral infarction affecting left non-dominant side: Secondary | ICD-10-CM | POA: Insufficient documentation

## 2016-08-28 DIAGNOSIS — M1990 Unspecified osteoarthritis, unspecified site: Secondary | ICD-10-CM | POA: Diagnosis not present

## 2016-08-28 DIAGNOSIS — Z880 Allergy status to penicillin: Secondary | ICD-10-CM | POA: Insufficient documentation

## 2016-08-28 DIAGNOSIS — E119 Type 2 diabetes mellitus without complications: Secondary | ICD-10-CM | POA: Diagnosis not present

## 2016-08-28 DIAGNOSIS — M8580 Other specified disorders of bone density and structure, unspecified site: Secondary | ICD-10-CM | POA: Insufficient documentation

## 2016-08-28 DIAGNOSIS — Z885 Allergy status to narcotic agent status: Secondary | ICD-10-CM | POA: Insufficient documentation

## 2016-08-28 DIAGNOSIS — Z7901 Long term (current) use of anticoagulants: Secondary | ICD-10-CM | POA: Insufficient documentation

## 2016-08-28 DIAGNOSIS — K219 Gastro-esophageal reflux disease without esophagitis: Secondary | ICD-10-CM | POA: Diagnosis not present

## 2016-08-28 DIAGNOSIS — R531 Weakness: Secondary | ICD-10-CM | POA: Diagnosis present

## 2016-08-28 DIAGNOSIS — I10 Essential (primary) hypertension: Secondary | ICD-10-CM | POA: Diagnosis not present

## 2016-08-28 DIAGNOSIS — E860 Dehydration: Secondary | ICD-10-CM

## 2016-08-28 DIAGNOSIS — J111 Influenza due to unidentified influenza virus with other respiratory manifestations: Secondary | ICD-10-CM

## 2016-08-28 NOTE — ED Triage Notes (Signed)
BIB EMS from home, reports flu like S/S X2-3 days. Fever/chills/cough. A/OX4. VSS. Lung sounds clear per EMS

## 2016-08-28 NOTE — ED Provider Notes (Signed)
[facility]-EMERGENCY DEPT Provider Note   CSN: [id number] Arrival date & time: 08/28/16  2213 By signing my name below, I, Tom Wang, attest that this documentation has been prepared under the direction and in the presence of Tom Kaplan, MD . Electronically Signed: Levon Wang, Scribe. 08/28/2016. 11:54 PM.   History   Chief Complaint Chief Complaint  Patient presents with  . Flu like S/S    HPI Tom Wang is a 83 y.o. male with a PMHx of DM and stroke who presents to the Emergency Department complaining of gradually worsening generalized weakness onset yesterday. Per pt's granddaughter, pt has been moving more slowly today. She reports that it was difficult for him to ambulate on his own due to the weakness today. Family members note associated fever (tmax 101.4) which has since resolved, intermittent cough productive of clear sputum, rhinorrhea, and sneezing, all of which began last night. He has taken ibuprofen this afternoon with no reported relief. No alleviating or modifying factors noted.  [name] denies any pressure ulcers. He did not receive a flu shot this year. No recent hospitalizations. Pt had positive sick contact at the [place] this week and with his granddaughter at home. He denies chest pain, SOB, chills, diaphoresis, abdominal pain, nausea, vomiting, diarrhea, dysuria, hematuria, sore throat or any other associated symptoms.   The history is provided by the patient and a relative. No language interpreter was used.    Past Medical History:  Diagnosis Date  . Arthritis    ankles  . Diabetes mellitus    oral meds only  . GERD (gastroesophageal reflux disease)   . Hypertension   . Pneumonia    20 years ago .  Marland Kitchen Stroke [facility])     Patient Active Problem List   Diagnosis Date Noted  . Influenza A 08/29/2016    Past Surgical History:  Procedure Laterality Date  . CATARACT EXTRACTION W/PHACO  04/26/2012   Procedure: CATARACT EXTRACTION PHACO AND  INTRAOCULAR LENS PLACEMENT (IOC);  Surgeon: Chalmers Guest, MD;  Location: [facility] OR;  Service: Ophthalmology;  Laterality: Right;  . TONSILLECTOMY    . TUMOR EXCISION     60 years ago .. right forearm .        Home Medications    Prior to Admission medications   Medication Sig Start Date End Date Taking? Authorizing Provider  acetaminophen (TYLENOL) 500 MG tablet Take 500 mg by mouth every morning.   Yes Historical Provider, MD  Acetylcarnitine HCl (ACETYL L-CARNITINE PO) Take 400 mg by mouth daily.   Yes Historical Provider, MD  Alpha-Lipoic Acid 200 MG TABS Take 200 mg by mouth daily.   Yes Historical Provider, MD  apixaban (ELIQUIS) 2.5 MG TABS tablet Take 2.5 mg by mouth 2 (two) times daily.   Yes Historical Provider, MD  Calcium Citrate 333 MG TABS Take 333 mg by mouth daily.   Yes Historical Provider, MD  cholecalciferol (VITAMIN D) 1000 UNITS tablet Take 500 Units by mouth at bedtime.    Yes Historical Provider, MD  ferrous sulfate 325 (65 FE) MG EC tablet Take 325 mg by mouth daily with breakfast.   Yes Historical Provider, MD  glipiZIDE (GLUCOTROL) 10 MG tablet Take 10 mg by mouth 2 (two) times daily before a meal.   Yes Historical Provider, MD  Lactobacillus Rhamnosus, GG, (CULTURELLE) CAPS Take 1 capsule by mouth daily.   Yes Historical Provider, MD  losartan (COZAAR) 25 MG tablet Take 12.5 mg by mouth every morning.  Yes Historical Provider, MD  Magnesium 250 MG TABS Take 250 mg by mouth at bedtime.   Yes Historical Provider, MD  Misc Natural Products (TURMERIC CURCUMIN) CAPS Take 1 capsule by mouth daily.    Yes Historical Provider, MD  Multiple Vitamin (MULTIVITAMIN WITH MINERALS) TABS Take 1 tablet by mouth daily.    Yes Historical Provider, MD  omeprazole (PRILOSEC) 20 MG capsule Take 20 mg by mouth daily.   Yes Historical Provider, MD  senna-docusate (SENOKOT-S) 8.6-50 MG tablet Take 2 tablets by mouth daily.   Yes Historical Provider, MD  simvastatin (ZOCOR) 40 MG tablet  Take 20 mg by mouth daily at 6 PM.   Yes Historical Provider, MD  vitamin B-12 (CYANOCOBALAMIN) 1000 MCG tablet Take 1,000 mcg by mouth daily.   Yes Historical Provider, MD  zinc gluconate 50 MG tablet Take 50 mg by mouth daily.   Yes Historical Provider, MD  HYDROcodone-acetaminophen (NORCO) 5-325 MG per tablet Take 2 tablets by mouth every 4 (four) hours as needed for pain. Patient not taking: Reported on 08/29/2016 04/25/13   Geoffery Lyonsouglas Delo, MD  HYDROcodone-acetaminophen (NORCO/VICODIN) 5-325 MG per tablet Take 1 tablet by mouth every 4 (four) hours as needed for pain. Patient not taking: Reported on 08/29/2016 05/29/13   Jerelyn ScottMartha Linker, MD    Family History No family history on file.  Social History Social History  Substance Use Topics  . Smoking status: Never Smoker  . Smokeless tobacco: Not on file  . Alcohol use No     Allergies   Morphine and related; Other; Gabapentin; and Penicillins   Review of Systems Review of Systems  Constitutional: Positive for fever. Negative for chills and diaphoresis.  HENT: Positive for rhinorrhea and sneezing. Negative for sore throat.   Respiratory: Positive for choking. Negative for shortness of breath.   Cardiovascular: Negative for chest pain.  Gastrointestinal: Negative for abdominal pain, diarrhea, nausea and vomiting.  Genitourinary: Negative for dysuria and hematuria.  Neurological: Positive for weakness.  All other systems reviewed and are negative.  Physical Exam Updated Vital Signs BP 151/67   Pulse 90   Temp 98.5 F (36.9 C) (Oral)   Resp 16   Ht 6\' 1"  (1.854 m)   Wt 260 lb (117.9 kg)   SpO2 96%   BMI 34.30 kg/m   Physical Exam  Constitutional: He is oriented to person, place, and time. He appears well-developed and well-nourished. No distress.  HENT:  Head: Normocephalic and atraumatic.  Posterior oropharynx has slight erythema, no exudate.   Eyes: Conjunctivae are normal.  Red eye on the left side appreciated     Cardiovascular: Normal rate and regular rhythm.   Pulmonary/Chest: Effort normal and breath sounds normal. No respiratory distress. He has no wheezes. He has no rales.  Abdominal: He exhibits no distension.  Lymphadenopathy:    He has cervical adenopathy (L > R).  Neurological: He is alert and oriented to person, place, and time.  Skin: Skin is warm and dry.  Psychiatric: He has a normal mood and affect.  Nursing note and vitals reviewed.  ED Treatments / Results  DIAGNOSTIC STUDIES:  Oxygen Saturation is 99% on RA, normal by my interpretation.    COORDINATION OF CARE:  11:19 PM Discussed treatment plan with pt at bedside and pt agreed to plan.   Labs (all labs ordered are listed, but only abnormal results are displayed) Labs Reviewed  CBC WITH DIFFERENTIAL/PLATELET - Abnormal; Notable for the following:  Result Value   WBC 11.1 (*)    Hemoglobin 12.3 (*)    HCT 37.6 (*)    RDW 16.0 (*)    Platelets 146 (*)    Monocytes Absolute 2.8 (*)    All other components within normal limits  COMPREHENSIVE METABOLIC PANEL - Abnormal; Notable for the following:    Chloride 100 (*)    Glucose, Bld 179 (*)    Albumin 3.2 (*)    ALT 16 (*)    GFR calc non Af Amer 50 (*)    GFR calc Af Amer 58 (*)    All other components within normal limits  URINALYSIS, ROUTINE W REFLEX MICROSCOPIC - Abnormal; Notable for the following:    Color, Urine AMBER (*)    APPearance HAZY (*)    Ketones, ur 5 (*)    Protein, ur 100 (*)    All other components within normal limits  INFLUENZA PANEL BY PCR (TYPE A & B) - Abnormal; Notable for the following:    Influenza A By PCR POSITIVE (*)    All other components within normal limits  TROPONIN I    EKG  EKG Interpretation None       Radiology Dg Chest Port 1 View  Result Date: 08/29/2016 CLINICAL DATA:  83 year old male with cough. EXAM: PORTABLE CHEST 1 VIEW COMPARISON:  Chest radiograph dated 04/26/2012 FINDINGS: There is emphysematous  changes of the lungs with interstitial coarsening similar to prior radiograph. No focal consolidation, pleural effusion, or pneumothorax. The cardiac silhouette is within normal limits. There is mild prominence of the hila bilaterally which may represent a degree of pulmonary hypertension. There is osteopenia with degenerative changes of the shoulders. No acute osseous pathology. IMPRESSION: No active disease. Electronically Signed   By: Elgie Collard M.D.   On: 08/29/2016 02:40    Procedures Procedures (including critical care time)  Medications Ordered in ED Medications  0.9 %  sodium chloride infusion (not administered)  sodium chloride 0.9 % bolus 1,000 mL (0 mLs Intravenous Stopped 08/29/16 0351)  oseltamivir (TAMIFLU) capsule 30 mg (30 mg Oral Given 08/29/16 0450)     Initial Impression / Assessment and Plan / ED Course  I have reviewed the triage vital signs and the nursing notes.  Pertinent labs & imaging results that were available during my care of the patient were reviewed by me and considered in my medical decision making (see chart for details).    DDx: Sepsis syndrome ACS syndrome ICH / Stroke Infection - pneumonia/UTI/Cellulitis / influenza Dehydration Electrolyte abnormality Tox syndrome  Pt comes in with cc of weakness. He had fevers at home. Pt is having some URI like symptoms. Concerns for underlying infection. Family reports that pt is not as active, and pretty lethargic at home -which is why they brought him in.   Final Clinical Impressions(s) / ED Diagnoses   Final diagnoses:  Weakness  Flu-like symptoms  Influenza  Dehydration    New Prescriptions New Prescriptions   No medications on file     Tom Kaplan, MD 08/29/16 [phone]

## 2016-08-28 NOTE — ED Notes (Signed)
EDP at bedside  

## 2016-08-29 ENCOUNTER — Emergency Department (HOSPITAL_COMMUNITY): Payer: Medicare HMO

## 2016-08-29 DIAGNOSIS — K219 Gastro-esophageal reflux disease without esophagitis: Secondary | ICD-10-CM | POA: Diagnosis not present

## 2016-08-29 DIAGNOSIS — E785 Hyperlipidemia, unspecified: Secondary | ICD-10-CM

## 2016-08-29 DIAGNOSIS — E860 Dehydration: Secondary | ICD-10-CM | POA: Diagnosis not present

## 2016-08-29 DIAGNOSIS — J101 Influenza due to other identified influenza virus with other respiratory manifestations: Secondary | ICD-10-CM | POA: Diagnosis not present

## 2016-08-29 DIAGNOSIS — R531 Weakness: Secondary | ICD-10-CM

## 2016-08-29 DIAGNOSIS — I10 Essential (primary) hypertension: Secondary | ICD-10-CM | POA: Diagnosis not present

## 2016-08-29 LAB — CBC WITH DIFFERENTIAL/PLATELET
BASOS ABS: 0 10*3/uL (ref 0.0–0.1)
BASOS PCT: 0 %
EOS ABS: 0.1 10*3/uL (ref 0.0–0.7)
Eosinophils Relative: 1 %
HCT: 37.6 % — ABNORMAL LOW (ref 39.0–52.0)
Hemoglobin: 12.3 g/dL — ABNORMAL LOW (ref 13.0–17.0)
Lymphocytes Relative: 9 %
Lymphs Abs: 1 10*3/uL (ref 0.7–4.0)
MCH: 26.2 pg (ref 26.0–34.0)
MCHC: 32.7 g/dL (ref 30.0–36.0)
MCV: 80 fL (ref 78.0–100.0)
MONOS PCT: 25 %
Monocytes Absolute: 2.8 10*3/uL — ABNORMAL HIGH (ref 0.1–1.0)
NEUTROS PCT: 65 %
Neutro Abs: 7.3 10*3/uL (ref 1.7–7.7)
Platelets: 146 10*3/uL — ABNORMAL LOW (ref 150–400)
RBC: 4.7 MIL/uL (ref 4.22–5.81)
RDW: 16 % — ABNORMAL HIGH (ref 11.5–15.5)
WBC: 11.1 10*3/uL — ABNORMAL HIGH (ref 4.0–10.5)

## 2016-08-29 LAB — URINALYSIS, ROUTINE W REFLEX MICROSCOPIC
Bacteria, UA: NONE SEEN
Bilirubin Urine: NEGATIVE
Glucose, UA: NEGATIVE mg/dL
Hgb urine dipstick: NEGATIVE
Ketones, ur: 5 mg/dL — AB
LEUKOCYTES UA: NEGATIVE
Nitrite: NEGATIVE
PROTEIN: 100 mg/dL — AB
Specific Gravity, Urine: 1.016 (ref 1.005–1.030)
Squamous Epithelial / LPF: NONE SEEN
pH: 5 (ref 5.0–8.0)

## 2016-08-29 LAB — COMPREHENSIVE METABOLIC PANEL
ALBUMIN: 3.2 g/dL — AB (ref 3.5–5.0)
ALT: 16 U/L — ABNORMAL LOW (ref 17–63)
ANION GAP: 12 (ref 5–15)
AST: 24 U/L (ref 15–41)
Alkaline Phosphatase: 68 U/L (ref 38–126)
BUN: 15 mg/dL (ref 6–20)
CO2: 23 mmol/L (ref 22–32)
Calcium: 9.4 mg/dL (ref 8.9–10.3)
Chloride: 100 mmol/L — ABNORMAL LOW (ref 101–111)
Creatinine, Ser: 1.23 mg/dL (ref 0.61–1.24)
GFR calc Af Amer: 58 mL/min — ABNORMAL LOW (ref >60)
GFR calc non Af Amer: 50 mL/min — ABNORMAL LOW (ref >60)
Glucose, Bld: 179 mg/dL — ABNORMAL HIGH (ref 65–99)
Potassium: 3.7 mmol/L (ref 3.5–5.1)
SODIUM: 135 mmol/L (ref 135–145)
Total Bilirubin: 0.6 mg/dL (ref 0.3–1.2)
Total Protein: 6.6 g/dL (ref 6.5–8.1)

## 2016-08-29 LAB — GLUCOSE, CAPILLARY
GLUCOSE-CAPILLARY: 139 mg/dL — AB (ref 65–99)
Glucose-Capillary: 154 mg/dL — ABNORMAL HIGH (ref 65–99)
Glucose-Capillary: 171 mg/dL — ABNORMAL HIGH (ref 65–99)
Glucose-Capillary: 189 mg/dL — ABNORMAL HIGH (ref 65–99)

## 2016-08-29 LAB — INFLUENZA PANEL BY PCR (TYPE A & B)
Influenza A By PCR: POSITIVE — AB
Influenza B By PCR: NEGATIVE

## 2016-08-29 LAB — TROPONIN I

## 2016-08-29 MED ORDER — FERROUS SULFATE 325 (65 FE) MG PO TABS
325.0000 mg | ORAL_TABLET | Freq: Every day | ORAL | Status: DC
  Administered 2016-08-29 – 2016-08-30 (×2): 325 mg via ORAL
  Filled 2016-08-29 (×2): qty 1

## 2016-08-29 MED ORDER — VITAMIN D 1000 UNITS PO TABS
500.0000 [IU] | ORAL_TABLET | Freq: Every day | ORAL | Status: DC
  Administered 2016-08-29: 500 [IU] via ORAL
  Filled 2016-08-29: qty 1

## 2016-08-29 MED ORDER — SODIUM CHLORIDE 0.9 % IV BOLUS (SEPSIS)
1000.0000 mL | Freq: Once | INTRAVENOUS | Status: AC
  Administered 2016-08-29: 1000 mL via INTRAVENOUS

## 2016-08-29 MED ORDER — ACETAMINOPHEN 500 MG PO TABS
500.0000 mg | ORAL_TABLET | Freq: Every morning | ORAL | Status: DC
  Administered 2016-08-29 – 2016-08-30 (×2): 500 mg via ORAL
  Filled 2016-08-29 (×2): qty 1

## 2016-08-29 MED ORDER — OSELTAMIVIR PHOSPHATE 30 MG PO CAPS
30.0000 mg | ORAL_CAPSULE | Freq: Two times a day (BID) | ORAL | Status: DC
  Administered 2016-08-29 – 2016-08-30 (×2): 30 mg via ORAL
  Filled 2016-08-29 (×2): qty 1

## 2016-08-29 MED ORDER — OSELTAMIVIR PHOSPHATE 75 MG PO CAPS: 75.0000 mg | ORAL_CAPSULE | Freq: Two times a day (BID) | ORAL | Status: DC

## 2016-08-29 MED ORDER — GLIPIZIDE 5 MG PO TABS
10.0000 mg | ORAL_TABLET | Freq: Two times a day (BID) | ORAL | Status: DC
  Administered 2016-08-29 – 2016-08-30 (×3): 10 mg via ORAL
  Filled 2016-08-29 (×3): qty 2

## 2016-08-29 MED ORDER — CULTURELLE PO CAPS
1.0000 | ORAL_CAPSULE | Freq: Every day | ORAL | Status: DC
  Filled 2016-08-29: qty 1

## 2016-08-29 MED ORDER — VITAMIN B-12 1000 MCG PO TABS
1000.0000 ug | ORAL_TABLET | Freq: Every day | ORAL | Status: DC
  Administered 2016-08-29 – 2016-08-30 (×2): 1000 ug via ORAL
  Filled 2016-08-29 (×2): qty 1

## 2016-08-29 MED ORDER — OSELTAMIVIR PHOSPHATE 75 MG PO CAPS
75.0000 mg | ORAL_CAPSULE | Freq: Once | ORAL | Status: DC
  Filled 2016-08-29: qty 1

## 2016-08-29 MED ORDER — APIXABAN 2.5 MG PO TABS
2.5000 mg | ORAL_TABLET | Freq: Two times a day (BID) | ORAL | Status: DC
  Administered 2016-08-29 – 2016-08-30 (×3): 2.5 mg via ORAL
  Filled 2016-08-29 (×3): qty 1

## 2016-08-29 MED ORDER — SIMVASTATIN 20 MG PO TABS
20.0000 mg | ORAL_TABLET | Freq: Every day | ORAL | Status: DC
  Administered 2016-08-29: 20 mg via ORAL
  Filled 2016-08-29: qty 1

## 2016-08-29 MED ORDER — CULTURELLE PO CAPS: 1.0000 | ORAL_CAPSULE | Freq: Every day | ORAL | Status: DC

## 2016-08-29 MED ORDER — ACETAMINOPHEN 325 MG PO TABS: 650.0000 mg | ORAL_TABLET | Freq: Four times a day (QID) | ORAL | Status: DC | PRN

## 2016-08-29 MED ORDER — ACETAMINOPHEN 650 MG RE SUPP: 650.0000 mg | Freq: Four times a day (QID) | RECTAL | Status: DC | PRN

## 2016-08-29 MED ORDER — INSULIN ASPART 100 UNIT/ML ~~LOC~~ SOLN
0.0000 [IU] | Freq: Three times a day (TID) | SUBCUTANEOUS | Status: DC
Start: 2016-08-29 — End: 2016-08-30

## 2016-08-29 MED ORDER — SODIUM CHLORIDE 0.9 % IV SOLN: Freq: Once | INTRAVENOUS | Status: DC

## 2016-08-29 MED ORDER — ONDANSETRON HCL 4 MG/2ML IJ SOLN: 4.0000 mg | Freq: Four times a day (QID) | INTRAMUSCULAR | Status: DC | PRN

## 2016-08-29 MED ORDER — SENNOSIDES-DOCUSATE SODIUM 8.6-50 MG PO TABS
2.0000 | ORAL_TABLET | Freq: Every day | ORAL | Status: DC
  Administered 2016-08-29 – 2016-08-30 (×2): 2 via ORAL
  Filled 2016-08-29 (×2): qty 2

## 2016-08-29 MED ORDER — OSELTAMIVIR PHOSPHATE 30 MG PO CAPS
30.0000 mg | ORAL_CAPSULE | Freq: Once | ORAL | Status: AC
  Administered 2016-08-29: 30 mg via ORAL
  Filled 2016-08-29: qty 1

## 2016-08-29 MED ORDER — BACID PO TABS
1.0000 | ORAL_TABLET | Freq: Every day | ORAL | Status: DC
  Administered 2016-08-29 – 2016-08-30 (×2): 1 via ORAL
  Filled 2016-08-29 (×2): qty 1

## 2016-08-29 MED ORDER — TRAZODONE HCL 50 MG PO TABS: 25.0000 mg | ORAL_TABLET | Freq: Every evening | ORAL | Status: DC | PRN

## 2016-08-29 MED ORDER — PANTOPRAZOLE SODIUM 40 MG PO TBEC
40.0000 mg | DELAYED_RELEASE_TABLET | Freq: Every day | ORAL | Status: DC
  Administered 2016-08-29 – 2016-08-30 (×2): 40 mg via ORAL
  Filled 2016-08-29 (×2): qty 1

## 2016-08-29 MED ORDER — ONDANSETRON HCL 4 MG PO TABS: 4.0000 mg | ORAL_TABLET | Freq: Four times a day (QID) | ORAL | Status: DC | PRN

## 2016-08-29 MED ORDER — LOSARTAN POTASSIUM 25 MG PO TABS
12.5000 mg | ORAL_TABLET | Freq: Every morning | ORAL | Status: DC
  Administered 2016-08-29 – 2016-08-30 (×2): 12.5 mg via ORAL
  Filled 2016-08-29 (×2): qty 0.5

## 2016-08-29 MED ORDER — RISAQUAD PO CAPS
1.0000 | ORAL_CAPSULE | Freq: Every day | ORAL | Status: DC
  Administered 2016-08-29 – 2016-08-30 (×2): 1 via ORAL
  Filled 2016-08-29 (×2): qty 1

## 2016-08-29 MED ORDER — SODIUM CHLORIDE 0.9 % IV SOLN
INTRAVENOUS | Status: AC
  Administered 2016-08-29: 09:00:00 via INTRAVENOUS

## 2016-08-29 MED ORDER — ACETYL L-CARNITINE 500 MG PO CAPS: 400.0000 mg | ORAL_CAPSULE | Freq: Every day | ORAL | Status: DC

## 2016-08-29 NOTE — ED Notes (Signed)
IV team at bedside 

## 2016-08-29 NOTE — Progress Notes (Addendum)
Received patient from ED. Patient AOx4, has left-sided weakness due to hx of stroke, skin is intact, VS stable, on RA,  On droplet precaution for positive Influenza A bt PCR and family members at bedside. CNA oriented patient to floor, bed controls and call light.  Will endorse appropriately to day shift RN.  Paged admission RN for recent admission.

## 2016-08-29 NOTE — Progress Notes (Signed)
Patient 's family does not want the patient to have insulin. Per family ", He can't tolerate insulin." RN will hold 1200 insulin dose. Blood sugar is currently 139. Will continue to monitor.

## 2016-08-29 NOTE — ED Notes (Signed)
EDP at bedside  

## 2016-08-29 NOTE — ED Notes (Signed)
Requested urine sample form pt

## 2016-08-29 NOTE — H&P (Signed)
History and Physical    Akhil Piscopo ZOX:[id number] DOB: 14-Jun-1925 DOA: 08/28/2016  PCP: No PCP Per Patient   Patient coming from: Home  Chief Complaint:myalgia, congestion, cough, fever  HPI: Tom Wang is a 83 y.o. male with medical history significant of DM, HTN, arthritis, GERD and stroke who presented to the Emergency Department complaining of gradually worsening generalized weakness. Onset of symptoms was yesterday and gradually worsening with increasing weakness and slower ambulation than usual. Patient's grandduagther reports that he was having fevers with max 101.4, intermittent cough productive of clear sputum, rhinorrhea, and sneezing, all of which began last night. He has taken ibuprofen this afternoon with no reported relief.  Family reported that patient has not been vaccinated against flu this year. He denied chest pain, SOB, chills, diaphoresis, abdominal pain, nausea, vomiting, diarrhea, dysuria, hematuria, sore throat or any other associated symptoms  ED Course: on arrival patient was subfebrile, but the rest of VS were stable. X-ray didn't show any acute cardiopulmonary process, CBC showed mildly elevated white blood cells count at 11,100 Influenza A PCR was positive Review of Systems: As per HPI otherwise 10 point review of systems negative.   Ambulatory Status: walks independently  Past Medical History:  Diagnosis Date  . Arthritis    ankles  . Diabetes mellitus    oral meds only  . GERD (gastroesophageal reflux disease)   . Hypertension   . Pneumonia    20 years ago .  Marland Kitchen Stroke [facility])     Past Surgical History:  Procedure Laterality Date  . CATARACT EXTRACTION W/PHACO  04/26/2012   Procedure: CATARACT EXTRACTION PHACO AND INTRAOCULAR LENS PLACEMENT (IOC);  Surgeon: Chalmers Guest, MD;  Location: [facility] OR;  Service: Ophthalmology;  Laterality: Right;  . TONSILLECTOMY    . TUMOR EXCISION     60 years ago .. right forearm .     Social History   Social  History  . Marital status: Widowed    Spouse name: N/A  . Number of children: N/A  . Years of education: N/A   Occupational History  . Not on file.   Social History Main Topics  . Smoking status: Never Smoker  . Smokeless tobacco: Not on file  . Alcohol use No  . Drug use: No  . Sexual activity: No   Other Topics Concern  . Not on file   Social History Narrative  . No narrative on file    Allergies  Allergen Reactions  . Morphine And Related Other (See Comments)    SEVERE REACTION  . Other Other (See Comments)    NO NARCOTICS PER FAMILY  . Gabapentin Other (See Comments)    AGITATION, COMBATIVE  . Penicillins Other (See Comments)    UNKNOWN    No family history on file.  Prior to Admission medications   Medication Sig Start Date End Date Taking? Authorizing Provider  acetaminophen (TYLENOL) 500 MG tablet Take 500 mg by mouth every morning.   Yes Historical Provider, MD  Acetylcarnitine HCl (ACETYL L-CARNITINE PO) Take 400 mg by mouth daily.   Yes Historical Provider, MD  Alpha-Lipoic Acid 200 MG TABS Take 200 mg by mouth daily.   Yes Historical Provider, MD  apixaban (ELIQUIS) 2.5 MG TABS tablet Take 2.5 mg by mouth 2 (two) times daily.   Yes Historical Provider, MD  Calcium Citrate 333 MG TABS Take 333 mg by mouth daily.   Yes Historical Provider, MD  cholecalciferol (VITAMIN D) 1000 UNITS tablet Take 500 Units by  mouth at bedtime.    Yes Historical Provider, MD  ferrous sulfate 325 (65 FE) MG EC tablet Take 325 mg by mouth daily with breakfast.   Yes Historical Provider, MD  glipiZIDE (GLUCOTROL) 10 MG tablet Take 10 mg by mouth 2 (two) times daily before a meal.   Yes Historical Provider, MD  Lactobacillus Rhamnosus, GG, (CULTURELLE) CAPS Take 1 capsule by mouth daily.   Yes Historical Provider, MD  losartan (COZAAR) 25 MG tablet Take 12.5 mg by mouth every morning.    Yes Historical Provider, MD  Magnesium 250 MG TABS Take 250 mg by mouth at bedtime.   Yes  Historical Provider, MD  Misc Natural Products (TURMERIC CURCUMIN) CAPS Take 1 capsule by mouth daily.    Yes Historical Provider, MD  Multiple Vitamin (MULTIVITAMIN WITH MINERALS) TABS Take 1 tablet by mouth daily.    Yes Historical Provider, MD  omeprazole (PRILOSEC) 20 MG capsule Take 20 mg by mouth daily.   Yes Historical Provider, MD  senna-docusate (SENOKOT-S) 8.6-50 MG tablet Take 2 tablets by mouth daily.   Yes Historical Provider, MD  simvastatin (ZOCOR) 40 MG tablet Take 20 mg by mouth daily at 6 PM.   Yes Historical Provider, MD  vitamin B-12 (CYANOCOBALAMIN) 1000 MCG tablet Take 1,000 mcg by mouth daily.   Yes Historical Provider, MD  zinc gluconate 50 MG tablet Take 50 mg by mouth daily.   Yes Historical Provider, MD  HYDROcodone-acetaminophen (NORCO) 5-325 MG per tablet Take 2 tablets by mouth every 4 (four) hours as needed for pain. Patient not taking: Reported on 08/29/2016 04/25/13   Geoffery Lyons, MD  HYDROcodone-acetaminophen (NORCO/VICODIN) 5-325 MG per tablet Take 1 tablet by mouth every 4 (four) hours as needed for pain. Patient not taking: Reported on 08/29/2016 05/29/13   Jerelyn Scott, MD    Physical Exam: Vitals:   08/29/16 0500 08/29/16 0530 08/29/16 0600 08/29/16 0706  BP: 153/72 151/67 159/71 (!) 162/59  Pulse: 91 90 87 90  Resp:  16 18 17   Temp:    99.6 F (37.6 C)  TempSrc:    Oral  SpO2: 95% 96% 97% 94%  Weight:    116.3 kg (256 lb 6.3 oz)  Height:    6\' 2"  (1.88 m)     General: Appears calm and comfortable Eyes: PERRLA, EOMI, normal lids, iris ENT:  grossly normal hearing, lips & tongue, mucous membranes moist and intact Neck: no lymphoadenopathy, masses or thyromegaly Cardiovascular: RRR, no m/r/g. No JVD, carotid bruits. No LE edema.  Respiratory: bilateral no wheezes, rales, rhonchi or cracles. Normal respiratory effort. No accessory muscle use observed Abdomen: soft, non-tender, non-distended, no organomegaly or masses appreciated. BS present in all  quadrants Skin: no rash, ulcers or induration seen on limited exam Musculoskeletal: grossly normal tone BUE/BLE, good ROM, no bony abnormality or joint deformities observed Psychiatric: grossly normal mood and affect, speech fluent and appropriate, alert and oriented x3 Neurologic: CN II-XII grossly intact, moves all extremities in coordinated fashion, sensation intact  Labs on Admission: I have personally reviewed following labs and imaging studies  CBC, BMP  GFR: Estimated Creatinine Clearance: 55.2 mL/min (by C-G formula based on SCr of 1.23 mg/dL).   Creatinine Clearance: Estimated Creatinine Clearance: 55.2 mL/min (by C-G formula based on SCr of 1.23 mg/dL).    Radiological Exams on Admission: Dg Chest Port 1 View  Result Date: 08/29/2016 CLINICAL DATA:  83 year old male with cough. EXAM: PORTABLE CHEST 1 VIEW COMPARISON:  Chest radiograph dated 04/26/2012  FINDINGS: There is emphysematous changes of the lungs with interstitial coarsening similar to prior radiograph. No focal consolidation, pleural effusion, or pneumothorax. The cardiac silhouette is within normal limits. There is mild prominence of the hila bilaterally which may represent a degree of pulmonary hypertension. There is osteopenia with degenerative changes of the shoulders. No acute osseous pathology. IMPRESSION: No active disease. Electronically Signed   By: Elgie CollardArash  Radparvar M.D.   On: 08/29/2016 02:40    EKG: Independently reviewed -  Assessment/Plan Principal Problem:   Influenza A Active Problems:   GERD (gastroesophageal reflux disease)   HTN (hypertension)   Hyperlipidemia   Hyperlipidemia    Influenza A Continue Tamiflu, IV hydration Monitory fevers and white blood cells count  Hypertension - currently stable Continue home medication and adjust the doses if needed depending on the BP readings  DM type II  Continue glipizide Initiate diabetic diet, monitor FSBS QACHS, add sliding scale  insulin  Hyperlipidemia  Recheck fasting lipids Continue statin therapy and adjust the doses if needed  DVT prophylaxis:  Eliquis after CVA Code Status: Full Family Communication:  at the bedside  Disposition Plan:  Med/surg Consults called:  none Admission status: Observation   Vinetta BergamoMarina Vernon Maish, PA-C Pager: [phone] [facility]  If 7PM-7AM, please contact night-coverage [email or web address] Password [email or web address]  08/29/2016, 8:10 AM

## 2016-08-29 NOTE — ED Notes (Signed)
IV X2, once by Annia FriendlySusanna RN and other by [employer] Industriesori RN. Will consult to IV team

## 2016-08-29 NOTE — Progress Notes (Addendum)
PHARMACY NOTE:  ANTIMICROBIAL RENAL DOSAGE ADJUSTMENT  Current antimicrobial regimen includes a mismatch between antimicrobial dosage and estimated renal function.  As per policy approved by the Pharmacy & Therapeutics and Medical Executive Committees, the antimicrobial dosage will be adjusted accordingly.  Current antimicrobial dosage:  Tamiflu 75mg  po BID  Indication: Flu  Renal Function:  Estimated Creatinine Clearance: 55.2 mL/min (by C-G formula based on SCr of 1.23 mg/dL).  Antimicrobial dosage has been changed to:  Tamiflu 30mg  po BID for CrCl 30-60 ml/min  Thank you for allowing pharmacy to be a part of this patient's care.  PHARMACIST - PHYSICIAN ORDER COMMUNICATION  CONCERNING: P&T Medication Policy on Herbal Medications  DESCRIPTION:  This patient's order for:  Acetyl - L- carnitine  has been noted.  This product(s) is classified as an "herbal" or natural product. Due to a lack of definitive safety studies or FDA approval, nonstandard manufacturing practices, plus the potential risk of unknown drug-drug interactions while on inpatient medications, the Pharmacy and Therapeutics Committee does not permit the use of "herbal" or natural products of this type within [facility].   ACTION TAKEN: The pharmacy department is unable to verify this order at this time and your patient has been informed of this safety policy. Please reevaluate patient's clinical condition at discharge and address if the herbal or natural product(s) should be resumed at that time.  Christoper Fabianaron Bakary Bramer, PharmD, BCPS Clinical pharmacist, pager [phone] 08/29/2016 8:17 AM

## 2016-08-30 DIAGNOSIS — R531 Weakness: Secondary | ICD-10-CM | POA: Diagnosis not present

## 2016-08-30 DIAGNOSIS — E785 Hyperlipidemia, unspecified: Secondary | ICD-10-CM | POA: Diagnosis not present

## 2016-08-30 DIAGNOSIS — E119 Type 2 diabetes mellitus without complications: Secondary | ICD-10-CM | POA: Diagnosis not present

## 2016-08-30 DIAGNOSIS — J101 Influenza due to other identified influenza virus with other respiratory manifestations: Secondary | ICD-10-CM | POA: Diagnosis not present

## 2016-08-30 DIAGNOSIS — I69354 Hemiplegia and hemiparesis following cerebral infarction affecting left non-dominant side: Secondary | ICD-10-CM | POA: Diagnosis not present

## 2016-08-30 DIAGNOSIS — I10 Essential (primary) hypertension: Secondary | ICD-10-CM | POA: Diagnosis not present

## 2016-08-30 LAB — BASIC METABOLIC PANEL
ANION GAP: 5 (ref 5–15)
BUN: 10 mg/dL (ref 6–20)
CO2: 25 mmol/L (ref 22–32)
Calcium: 9 mg/dL (ref 8.9–10.3)
Chloride: 106 mmol/L (ref 101–111)
Creatinine, Ser: 0.82 mg/dL (ref 0.61–1.24)
GFR calc Af Amer: >60 mL/min (ref >60)
Glucose, Bld: 148 mg/dL — ABNORMAL HIGH (ref 65–99)
POTASSIUM: 3.7 mmol/L (ref 3.5–5.1)
SODIUM: 136 mmol/L (ref 135–145)

## 2016-08-30 LAB — GLUCOSE, CAPILLARY: GLUCOSE-CAPILLARY: 137 mg/dL — AB (ref 65–99)

## 2016-08-30 LAB — CBC
HEMATOCRIT: 35.9 % — AB (ref 39.0–52.0)
HEMOGLOBIN: 11.6 g/dL — AB (ref 13.0–17.0)
MCH: 25.5 pg — ABNORMAL LOW (ref 26.0–34.0)
MCHC: 32.3 g/dL (ref 30.0–36.0)
MCV: 78.9 fL (ref 78.0–100.0)
Platelets: 145 10*3/uL — ABNORMAL LOW (ref 150–400)
RBC: 4.55 MIL/uL (ref 4.22–5.81)
RDW: 15.6 % — AB (ref 11.5–15.5)
WBC: 6.7 10*3/uL (ref 4.0–10.5)

## 2016-08-30 MED ORDER — GUAIFENESIN ER 600 MG PO TB12
600.0000 mg | ORAL_TABLET | Freq: Two times a day (BID) | ORAL | Status: DC
Start: 2016-08-30 — End: 2016-08-30
  Administered 2016-08-30: 600 mg via ORAL
  Filled 2016-08-30: qty 1

## 2016-08-30 MED ORDER — GUAIFENESIN ER 600 MG PO TB12: 600.0000 mg | ORAL_TABLET | Freq: Two times a day (BID) | ORAL | 0 refills | Status: DC

## 2016-08-30 MED ORDER — OSELTAMIVIR PHOSPHATE 30 MG PO CAPS: 30.0000 mg | ORAL_CAPSULE | Freq: Two times a day (BID) | ORAL | 0 refills | Status: DC

## 2016-08-30 MED ORDER — ALBUTEROL SULFATE (2.5 MG/3ML) 0.083% IN NEBU: 2.5000 mg | INHALATION_SOLUTION | RESPIRATORY_TRACT | Status: DC | PRN

## 2016-08-30 NOTE — Progress Notes (Signed)
D/C papers gone over with pt. And granddaugther. IV taken out. Prescriptions given to pt. NO questions/complaints. Waiting for PTAR.

## 2016-08-30 NOTE — Care Management Note (Signed)
Case Management Note  Patient Details  Name: Tom GessLawrence Tabb MRN: [id number] Date of Birth: 1925-06-22  Subjective/Objective:                    Action/Plan:  Consult for ambulance  transportation home. Spoke with patient and grand daughter Merry ProudBrandi cell [phone] at bedside.   Home address confirmed:   [address]  [place] , [place] [id number]   Merry ProudBrandi wanting to also go in ambulance with patient.   Bedside nurse states patient is already to leave.   PTAR called Merry ProudBrandi can ride up front in ambulance , they will pick patient up in 30 minutes. Brandi and nurse and patient aware. Expected Discharge Date:  08/30/16               Expected Discharge Plan:  Home/Self Care  In-House Referral:     Discharge planning Services  CM Consult  Post Acute Care Choice:    Choice offered to:  Patient, Adult Children  DME Arranged:    DME Agency:     HH Arranged:    HH Agency:     Status of Service:  Completed, signed off  If discussed at [employer] Length of Stay Meetings, dates discussed:    Additional Comments:  Kingsley PlanWile, Jasie Meleski Marie, RN 08/30/2016, 10:16 AM

## 2016-08-30 NOTE — Discharge Summary (Addendum)
PATIENT DETAILS Name: Tom Wang Age: 83 y.o. Sex: male Date of Birth: June 13, 1925 MRN: [id number]. Admitting Physician: Pete Glatter, MD PCP:No PCP Per Patient  Admit Date: 08/28/2016 Discharge date: 08/30/2016  Recommendations for Outpatient Follow-up:  1. Follow up with PCP in 1-2 weeks 2. Please obtain BMP/CBC in one week  Admitted From:  Home  Disposition: Home    Home Health: No  Equipment/Devices: None  Discharge Condition: Stable  CODE STATUS: FULL CODE  Diet recommendation:  Heart Healthy / Carb Modified  Brief Summary: See H&P, Labs, Consult and Test reports for all details in brief, patient is a 83 year old male with history of CVA with resultant left-sided hemiparesis, on chronic anticoagulation with the liquids who presented to the hospital for evaluation of weakness and cough, was found to have influenza A. Patient was subsequently admitted for further evaluation and treatment.  Brief Hospital Course: Generalized weakness/cough secondary to influenza A: Admitted and started on Tamiflu. Patient claims that he feels much better today. Patient is bedbound and nonambulatory at baseline since 2014. He is 24/7 care at home with family. Granddaughter at bedside thinks that the patient is very close to his usual baseline and is requesting discharge. Continue Tamiflu for a total of 10 doses. No chest x-ray was negative for any pneumonitis.   History of CVA with left residual hemiplegia: Bedbound at baseline. Family able to use hyoyer lift to to get him to a wheelchair. He is on chronic anticoagulation with Eliquis. Family not sure if he has paroxysmal atrial fibrillation  Hypertension: Controlled-continue losartan   Type 2 diabetes: Stable-continue glipizide  Dyslipidemia: Continue statin  Rest of the patient's medical problems were stable during his short hospital stay  Procedures/Studies: None  Discharge Diagnoses:  Principal Problem:  Influenza A Active Problems:   Gastroesophageal reflux disease without esophagitis   Essential hypertension   Hyperlipidemia   Hyperlipidemia   Weakness   Dehydration   Discharge Instructions:  Activity:  As tolerated with Full fall precautions use walker/cane & assistance as needed  Discharge Instructions    Call MD for:  difficulty breathing, headache or visual disturbances    Complete by:  As directed    Call MD for:  extreme fatigue    Complete by:  As directed    Call MD for:  persistant dizziness or light-headedness    Complete by:  As directed    Diet - low sodium heart healthy    Complete by:  As directed    Discharge instructions    Complete by:  As directed    Follow with Primary MD in 1 week  Please get a complete blood count and chemistry panel checked by your Primary MD at your next visit, and again as instructed by your Primary MD.  Get Medicines reviewed and adjusted: Please take all your medications with you for your next visit with your Primary MD  Laboratory/radiological data: Please request your Primary MD to go over all hospital tests and procedure/radiological results at the follow up, please ask your Primary MD to get all Hospital records sent to his/her office.  In some cases, they will be blood work, cultures and biopsy results pending at the time of your discharge. Please request that your primary care M.D. follows up on these results.  Also Note the following: If you experience worsening of your admission symptoms, develop shortness of breath, life threatening emergency, suicidal or homicidal thoughts you must seek medical attention immediately by calling 911 or calling  your MD immediately  if symptoms less severe.  You must read complete instructions/literature along with all the possible adverse reactions/side effects for all the Medicines you take and that have been prescribed to you. Take any new Medicines after you have completely understood and  accpet all the possible adverse reactions/side effects.   Do not drive when taking Pain medications or sleeping medications (Benzodaizepines)  Do not take more than prescribed Pain, Sleep and Anxiety Medications. It is not advisable to combine anxiety,sleep and pain medications without talking with your primary care practitioner  Special Instructions: If you have smoked or chewed Tobacco  in the last 2 yrs please stop smoking, stop any regular Alcohol  and or any Recreational drug use.  Wear Seat belts while driving.  Please note: You were cared for by a hospitalist during your hospital stay. Once you are discharged, your primary care physician will handle any further medical issues. Please note that NO REFILLS for any discharge medications will be authorized once you are discharged, as it is imperative that you return to your primary care physician (or establish a relationship with a primary care physician if you do not have one) for your post hospital discharge needs so that they can reassess your need for medications and monitor your lab values.   Increase activity slowly    Complete by:  As directed      Allergies as of 08/30/2016      Reactions   Morphine And Related Other (See Comments)   SEVERE REACTION   Other Other (See Comments)   NO NARCOTICS PER FAMILY   Gabapentin Other (See Comments)   AGITATION, COMBATIVE   Penicillins Other (See Comments)   UNKNOWN      Medication List    TAKE these medications   acetaminophen 500 MG tablet Commonly known as:  TYLENOL Take 500 mg by mouth every morning.   ACETYL L-CARNITINE PO Take 400 mg by mouth daily.   Alpha-Lipoic Acid 200 MG Tabs Take 200 mg by mouth daily.   apixaban 2.5 MG Tabs tablet Commonly known as:  ELIQUIS Take 2.5 mg by mouth 2 (two) times daily.   Calcium Citrate 333 MG Tabs Take 333 mg by mouth daily.   cholecalciferol 1000 units tablet Commonly known as:  VITAMIN D Take 500 Units by mouth at  bedtime.   CULTURELLE Caps Take 1 capsule by mouth daily.   ferrous sulfate 325 (65 FE) MG EC tablet Take 325 mg by mouth daily with breakfast.   glipiZIDE 10 MG tablet Commonly known as:  GLUCOTROL Take 10 mg by mouth 2 (two) times daily before a meal.   guaiFENesin 600 MG 12 hr tablet Commonly known as:  MUCINEX Take 1 tablet (600 mg total) by mouth 2 (two) times daily.   losartan 25 MG tablet Commonly known as:  COZAAR Take 12.5 mg by mouth every morning.   Magnesium 250 MG Tabs Take 250 mg by mouth at bedtime.   multivitamin with minerals Tabs tablet Take 1 tablet by mouth daily.   omeprazole 20 MG capsule Commonly known as:  PRILOSEC Take 20 mg by mouth daily.   oseltamivir 30 MG capsule Commonly known as:  TAMIFLU Take 1 capsule (30 mg total) by mouth 2 (two) times daily.   senna-docusate 8.6-50 MG tablet Commonly known as:  Senokot-S Take 2 tablets by mouth daily.   simvastatin 40 MG tablet Commonly known as:  ZOCOR Take 20 mg by mouth daily at 6 PM.  Turmeric Curcumin Caps Take 1 capsule by mouth daily.   vitamin B-12 1000 MCG tablet Commonly known as:  CYANOCOBALAMIN Take 1,000 mcg by mouth daily.   zinc gluconate 50 MG tablet Take 50 mg by mouth daily.      Follow-up Information    Primary MD. Schedule an appointment as soon as possible for a visit in 1 week(s).          Allergies  Allergen Reactions  . Morphine And Related Other (See Comments)    SEVERE REACTION  . Other Other (See Comments)    NO NARCOTICS PER FAMILY  . Gabapentin Other (See Comments)    AGITATION, COMBATIVE  . Penicillins Other (See Comments)    UNKNOWN   Consultations:   None  Other Procedures/Studies: Dg Chest Port 1 View  Result Date: 08/29/2016 CLINICAL DATA:  83 year old male with cough. EXAM: PORTABLE CHEST 1 VIEW COMPARISON:  Chest radiograph dated 04/26/2012 FINDINGS: There is emphysematous changes of the lungs with interstitial coarsening similar to  prior radiograph. No focal consolidation, pleural effusion, or pneumothorax. The cardiac silhouette is within normal limits. There is mild prominence of the hila bilaterally which may represent a degree of pulmonary hypertension. There is osteopenia with degenerative changes of the shoulders. No acute osseous pathology. IMPRESSION: No active disease. Electronically Signed   By: Elgie Collard M.D.   On: 08/29/2016 02:40      TODAY-DAY OF DISCHARGE:  Subjective:   Tom Wang today has no headache,no chest abdominal pain,no new weakness tingling or numbness, feels much better wants to go home today.   Objective:   Blood pressure (!) 161/62, pulse 70, temperature 98.3 F (36.8 C), temperature source Oral, resp. rate 18, height 6\' 2"  (1.88 m), weight 116.1 kg (255 lb 15.3 oz), SpO2 100 %.  Intake/Output Summary (Last 24 hours) at 08/30/16 0859 Last data filed at 08/30/16 0230  Gross per 24 hour  Intake             2235 ml  Output              900 ml  Net             1335 ml   Filed Weights   08/28/16 2219 08/29/16 0706 08/30/16 0500  Weight: 117.9 kg (260 lb) 116.3 kg (256 lb 6.3 oz) 116.1 kg (255 lb 15.3 oz)    Exam: Awake Alert, Oriented *3, No new F.N deficits, Normal affect [place].AT,PERRAL Supple Neck,No JVD, No cervical lymphadenopathy appriciated.  Symmetrical Chest wall movement, Good air movement bilaterally, CTAB RRR,No Gallops,Rubs or new Murmurs, No Parasternal Heave +ve B.Sounds, Abd Soft, Non tender, No organomegaly appriciated, No rebound -guarding or rigidity. No Cyanosis, Clubbing or edema, No new Rash or bruise   PERTINENT RADIOLOGIC STUDIES: Dg Chest Port 1 View  Result Date: 08/29/2016 CLINICAL DATA:  83 year old male with cough. EXAM: PORTABLE CHEST 1 VIEW COMPARISON:  Chest radiograph dated 04/26/2012 FINDINGS: There is emphysematous changes of the lungs with interstitial coarsening similar to prior radiograph. No focal consolidation, pleural effusion, or  pneumothorax. The cardiac silhouette is within normal limits. There is mild prominence of the hila bilaterally which may represent a degree of pulmonary hypertension. There is osteopenia with degenerative changes of the shoulders. No acute osseous pathology. IMPRESSION: No active disease. Electronically Signed   By: Elgie Collard M.D.   On: 08/29/2016 02:40     PERTINENT LAB RESULTS: CBC:  Recent Labs  08/29/16 0000 08/30/16 0534  WBC 11.1*  6.7  HGB 12.3* 11.6*  HCT 37.6* 35.9*  PLT 146* 145*   CMET CMP     Component Value Date/Time   NA 136 08/30/2016 0534   K 3.7 08/30/2016 0534   CL 106 08/30/2016 0534   CO2 25 08/30/2016 0534   GLUCOSE 148 (H) 08/30/2016 0534   BUN 10 08/30/2016 0534   CREATININE 0.82 08/30/2016 0534   CALCIUM 9.0 08/30/2016 0534   PROT 6.6 08/29/2016 0000   ALBUMIN 3.2 (L) 08/29/2016 0000   AST 24 08/29/2016 0000   ALT 16 (L) 08/29/2016 0000   ALKPHOS 68 08/29/2016 0000   BILITOT 0.6 08/29/2016 0000   GFRNONAA >60 08/30/2016 0534   GFRAA >60 08/30/2016 0534    GFR Estimated Creatinine Clearance: 82.8 mL/min (by C-G formula based on SCr of 0.82 mg/dL). No results for input(s): LIPASE, AMYLASE in the last 72 hours.  Recent Labs  08/29/16 0000  TROPONINI <0.03   Invalid input(s): POCBNP No results for input(s): DDIMER in the last 72 hours. No results for input(s): HGBA1C in the last 72 hours. No results for input(s): CHOL, HDL, LDLCALC, TRIG, CHOLHDL, LDLDIRECT in the last 72 hours. No results for input(s): TSH, T4TOTAL, T3FREE, THYROIDAB in the last 72 hours.  Invalid input(s): FREET3 No results for input(s): VITAMINB12, FOLATE, FERRITIN, TIBC, IRON, RETICCTPCT in the last 72 hours. Coags: No results for input(s): INR in the last 72 hours.  Invalid input(s): PT Microbiology: No results found for this or any previous visit (from the past 240 hour(s)).  FURTHER DISCHARGE INSTRUCTIONS:  Get Medicines reviewed and adjusted: Please  take all your medications with you for your next visit with your Primary MD  Laboratory/radiological data: Please request your Primary MD to go over all hospital tests and procedure/radiological results at the follow up, please ask your Primary MD to get all Hospital records sent to his/her office.  In some cases, they will be blood work, cultures and biopsy results pending at the time of your discharge. Please request that your primary care M.D. goes through all the records of your hospital data and follows up on these results.  Also Note the following: If you experience worsening of your admission symptoms, develop shortness of breath, life threatening emergency, suicidal or homicidal thoughts you must seek medical attention immediately by calling 911 or calling your MD immediately  if symptoms less severe.  You must read complete instructions/literature along with all the possible adverse reactions/side effects for all the Medicines you take and that have been prescribed to you. Take any new Medicines after you have completely understood and accpet all the possible adverse reactions/side effects.   Do not drive when taking Pain medications or sleeping medications (Benzodaizepines)  Do not take more than prescribed Pain, Sleep and Anxiety Medications. It is not advisable to combine anxiety,sleep and pain medications without talking with your primary care practitioner  Special Instructions: If you have smoked or chewed Tobacco  in the last 2 yrs please stop smoking, stop any regular Alcohol  and or any Recreational drug use.  Wear Seat belts while driving.  Please note: You were cared for by a hospitalist during your hospital stay. Once you are discharged, your primary care physician will handle any further medical issues. Please note that NO REFILLS for any discharge medications will be authorized once you are discharged, as it is imperative that you return to your primary care physician (or  establish a relationship with a primary care physician if you do  not have one) for your post hospital discharge needs so that they can reassess your need for medications and monitor your lab values.  Total Time spent coordinating discharge including counseling, education and face to face time equals 25  minutes.  SignedJeoffrey Massed 08/30/2016 8:59 AM

## 2016-08-30 NOTE — Clinical Social Work Note (Signed)
CSW consulted for transportation needs. Pt is being discharge home. RNCM is following for discharge planning and transportation needs. CSW is signing off as no further needs identified.   Dede QuerySarah Appolonia Ackert, MSW, LCSW  Clinical Social Worker  [phone]

## 2016-08-30 NOTE — Progress Notes (Signed)
Pt. Transported home via [facility]. Per pt.'s family, home health care aware of pt. Returning home.

## 2018-03-23 ENCOUNTER — Emergency Department (HOSPITAL_COMMUNITY): Payer: Medicare HMO

## 2018-03-23 ENCOUNTER — Inpatient Hospital Stay (HOSPITAL_COMMUNITY)
Admission: EM | Admit: 2018-03-23 | Discharge: 2018-03-25 | DRG: 069 | Disposition: A | Discharge status: Home Health | Payer: Medicare HMO | Attending: Internal Medicine | Admitting: Internal Medicine

## 2018-03-23 ENCOUNTER — Other Ambulatory Visit: Payer: Self-pay

## 2018-03-23 ENCOUNTER — Observation Stay (HOSPITAL_COMMUNITY): Payer: Medicare HMO

## 2018-03-23 ENCOUNTER — Encounter (HOSPITAL_COMMUNITY): Payer: Self-pay | Admitting: Emergency Medicine

## 2018-03-23 DIAGNOSIS — D509 Iron deficiency anemia, unspecified: Secondary | ICD-10-CM | POA: Diagnosis present

## 2018-03-23 DIAGNOSIS — E669 Obesity, unspecified: Secondary | ICD-10-CM | POA: Diagnosis present

## 2018-03-23 DIAGNOSIS — G459 Transient cerebral ischemic attack, unspecified: Secondary | ICD-10-CM

## 2018-03-23 DIAGNOSIS — E1151 Type 2 diabetes mellitus with diabetic peripheral angiopathy without gangrene: Secondary | ICD-10-CM | POA: Diagnosis present

## 2018-03-23 DIAGNOSIS — E1165 Type 2 diabetes mellitus with hyperglycemia: Secondary | ICD-10-CM | POA: Diagnosis not present

## 2018-03-23 DIAGNOSIS — R29711 NIHSS score 11: Secondary | ICD-10-CM | POA: Diagnosis present

## 2018-03-23 DIAGNOSIS — G8194 Hemiplegia, unspecified affecting left nondominant side: Secondary | ICD-10-CM | POA: Diagnosis present

## 2018-03-23 DIAGNOSIS — G9340 Encephalopathy, unspecified: Secondary | ICD-10-CM | POA: Diagnosis not present

## 2018-03-23 DIAGNOSIS — E119 Type 2 diabetes mellitus without complications: Secondary | ICD-10-CM

## 2018-03-23 DIAGNOSIS — H409 Unspecified glaucoma: Secondary | ICD-10-CM | POA: Diagnosis present

## 2018-03-23 DIAGNOSIS — E1149 Type 2 diabetes mellitus with other diabetic neurological complication: Secondary | ICD-10-CM

## 2018-03-23 DIAGNOSIS — D649 Anemia, unspecified: Secondary | ICD-10-CM

## 2018-03-23 DIAGNOSIS — Z993 Dependence on wheelchair: Secondary | ICD-10-CM

## 2018-03-23 DIAGNOSIS — I10 Essential (primary) hypertension: Secondary | ICD-10-CM | POA: Diagnosis present

## 2018-03-23 DIAGNOSIS — Z7901 Long term (current) use of anticoagulants: Secondary | ICD-10-CM

## 2018-03-23 DIAGNOSIS — Z86718 Personal history of other venous thrombosis and embolism: Secondary | ICD-10-CM

## 2018-03-23 DIAGNOSIS — Z8673 Personal history of transient ischemic attack (TIA), and cerebral infarction without residual deficits: Secondary | ICD-10-CM

## 2018-03-23 DIAGNOSIS — K219 Gastro-esophageal reflux disease without esophagitis: Secondary | ICD-10-CM | POA: Diagnosis present

## 2018-03-23 DIAGNOSIS — R400 Somnolence: Secondary | ICD-10-CM

## 2018-03-23 DIAGNOSIS — Z7984 Long term (current) use of oral hypoglycemic drugs: Secondary | ICD-10-CM

## 2018-03-23 DIAGNOSIS — E785 Hyperlipidemia, unspecified: Secondary | ICD-10-CM | POA: Diagnosis not present

## 2018-03-23 DIAGNOSIS — R29810 Facial weakness: Secondary | ICD-10-CM | POA: Diagnosis present

## 2018-03-23 DIAGNOSIS — I69354 Hemiplegia and hemiparesis following cerebral infarction affecting left non-dominant side: Secondary | ICD-10-CM

## 2018-03-23 DIAGNOSIS — B309 Viral conjunctivitis, unspecified: Secondary | ICD-10-CM | POA: Diagnosis present

## 2018-03-23 DIAGNOSIS — Z79899 Other long term (current) drug therapy: Secondary | ICD-10-CM

## 2018-03-23 DIAGNOSIS — Z6837 Body mass index (BMI) 37.0-37.9, adult: Secondary | ICD-10-CM

## 2018-03-23 DIAGNOSIS — Z7401 Bed confinement status: Secondary | ICD-10-CM

## 2018-03-23 LAB — ECHOCARDIOGRAM COMPLETE
Height: 69 in
Weight: 4091.74 [oz_av]

## 2018-03-23 LAB — DIFFERENTIAL
ABS IMMATURE GRANULOCYTES: 0 10*3/uL (ref 0.0–0.1)
Basophils Absolute: 0.1 10*3/uL (ref 0.0–0.1)
Basophils Relative: 1 %
Eosinophils Absolute: 0.2 10*3/uL (ref 0.0–0.7)
Eosinophils Relative: 3 %
Immature Granulocytes: 0 %
LYMPHS ABS: 1.9 10*3/uL (ref 0.7–4.0)
LYMPHS PCT: 25 %
MONO ABS: 1.1 10*3/uL — AB (ref 0.1–1.0)
MONOS PCT: 14 %
NEUTROS ABS: 4.6 10*3/uL (ref 1.7–7.7)
NEUTROS PCT: 57 %

## 2018-03-23 LAB — COMPREHENSIVE METABOLIC PANEL
ALK PHOS: 57 U/L (ref 38–126)
ALT: 12 U/L (ref 0–44)
AST: 18 U/L (ref 15–41)
Albumin: 3.5 g/dL (ref 3.5–5.0)
Anion gap: 7 (ref 5–15)
BILIRUBIN TOTAL: 0.7 mg/dL (ref 0.3–1.2)
BUN: 14 mg/dL (ref 8–23)
CALCIUM: 9.7 mg/dL (ref 8.9–10.3)
CHLORIDE: 106 mmol/L (ref 98–111)
CO2: 26 mmol/L (ref 22–32)
CREATININE: 0.94 mg/dL (ref 0.61–1.24)
Glucose, Bld: 217 mg/dL — ABNORMAL HIGH (ref 70–99)
Potassium: 3.8 mmol/L (ref 3.5–5.1)
Sodium: 139 mmol/L (ref 135–145)
Total Protein: 6.6 g/dL (ref 6.5–8.1)

## 2018-03-23 LAB — URINALYSIS, ROUTINE W REFLEX MICROSCOPIC
BACTERIA UA: NONE SEEN
BILIRUBIN URINE: NEGATIVE
Glucose, UA: NEGATIVE mg/dL
Ketones, ur: NEGATIVE mg/dL
Leukocytes, UA: NEGATIVE
Nitrite: NEGATIVE
PH: 7 (ref 5.0–8.0)
Protein, ur: NEGATIVE mg/dL
Specific Gravity, Urine: 1.002 — ABNORMAL LOW (ref 1.005–1.030)

## 2018-03-23 LAB — I-STAT TROPONIN, ED: TROPONIN I, POC: 0.02 ng/mL (ref 0.00–0.08)

## 2018-03-23 LAB — RAPID URINE DRUG SCREEN, HOSP PERFORMED
Amphetamines: NOT DETECTED
BARBITURATES: NOT DETECTED
BENZODIAZEPINES: NOT DETECTED
Cocaine: NOT DETECTED
Opiates: NOT DETECTED
Tetrahydrocannabinol: NOT DETECTED

## 2018-03-23 LAB — CBC
HEMATOCRIT: 36.4 % — AB (ref 39.0–52.0)
HEMOGLOBIN: 11.6 g/dL — AB (ref 13.0–17.0)
MCH: 25.9 pg — AB (ref 26.0–34.0)
MCHC: 31.9 g/dL (ref 30.0–36.0)
MCV: 81.3 fL (ref 78.0–100.0)
Platelets: 185 10*3/uL (ref 150–400)
RBC: 4.48 MIL/uL (ref 4.22–5.81)
RDW: 15.2 % (ref 11.5–15.5)
WBC: 7.9 10*3/uL (ref 4.0–10.5)

## 2018-03-23 LAB — APTT: aPTT: 38 seconds — ABNORMAL HIGH (ref 24–36)

## 2018-03-23 LAB — PROTIME-INR
INR: 1.11
Prothrombin Time: 14.2 seconds (ref 11.4–15.2)

## 2018-03-23 LAB — ETHANOL: Alcohol, Ethyl (B): <10 mg/dL (ref <10)

## 2018-03-23 MED ORDER — ADULT MULTIVITAMIN W/MINERALS CH
1.0000 | ORAL_TABLET | Freq: Every day | ORAL | Status: DC
  Administered 2018-03-23 – 2018-03-24 (×2): 1 via ORAL
  Filled 2018-03-23 (×2): qty 1

## 2018-03-23 MED ORDER — SENNOSIDES-DOCUSATE SODIUM 8.6-50 MG PO TABS
2.0000 | ORAL_TABLET | Freq: Every day | ORAL | Status: DC
  Administered 2018-03-23 – 2018-03-24 (×2): 2 via ORAL
  Filled 2018-03-23 (×3): qty 2

## 2018-03-23 MED ORDER — SENNOSIDES-DOCUSATE SODIUM 8.6-50 MG PO TABS: 1.0000 | ORAL_TABLET | Freq: Every evening | ORAL | Status: DC | PRN

## 2018-03-23 MED ORDER — ALPHA-LIPOIC ACID 200 MG PO TABS: 200.0000 mg | ORAL_TABLET | Freq: Every day | ORAL | Status: DC

## 2018-03-23 MED ORDER — PANTOPRAZOLE SODIUM 40 MG PO TBEC
40.0000 mg | DELAYED_RELEASE_TABLET | Freq: Every day | ORAL | Status: DC
  Administered 2018-03-24 – 2018-03-25 (×2): 40 mg via ORAL
  Filled 2018-03-23 (×3): qty 1

## 2018-03-23 MED ORDER — CULTURELLE PO CAPS: 1.0000 | ORAL_CAPSULE | Freq: Every day | ORAL | Status: DC

## 2018-03-23 MED ORDER — SIMVASTATIN 20 MG PO TABS
20.0000 mg | ORAL_TABLET | Freq: Every day | ORAL | Status: DC
  Administered 2018-03-24: 20 mg via ORAL
  Filled 2018-03-23 (×3): qty 1

## 2018-03-23 MED ORDER — CALCIUM CITRATE 950 (200 CA) MG PO TABS
333.0000 mg | ORAL_TABLET | Freq: Every day | ORAL | Status: DC
  Filled 2018-03-23: qty 1

## 2018-03-23 MED ORDER — VITAMIN B-12 1000 MCG PO TABS
1000.0000 ug | ORAL_TABLET | Freq: Every day | ORAL | Status: DC
  Administered 2018-03-24 – 2018-03-25 (×2): 1000 ug via ORAL
  Filled 2018-03-23 (×3): qty 1

## 2018-03-23 MED ORDER — ACETAMINOPHEN 160 MG/5ML PO SOLN: 650.0000 mg | ORAL | Status: DC | PRN

## 2018-03-23 MED ORDER — GLIPIZIDE 10 MG PO TABS
10.0000 mg | ORAL_TABLET | Freq: Two times a day (BID) | ORAL | Status: DC
  Administered 2018-03-24 – 2018-03-25 (×4): 10 mg via ORAL
  Filled 2018-03-23 (×2): qty 1
  Filled 2018-03-23 (×2): qty 2
  Filled 2018-03-23: qty 1
  Filled 2018-03-23 (×2): qty 2
  Filled 2018-03-23: qty 1
  Filled 2018-03-23: qty 2
  Filled 2018-03-23: qty 1
  Filled 2018-03-23: qty 2

## 2018-03-23 MED ORDER — CALCIUM CITRATE 950 (200 CA) MG PO TABS
200.0000 mg | ORAL_TABLET | Freq: Every day | ORAL | Status: DC
  Administered 2018-03-24 – 2018-03-25 (×2): 200 mg via ORAL
  Filled 2018-03-23 (×2): qty 1

## 2018-03-23 MED ORDER — ACETAMINOPHEN 650 MG RE SUPP: 650.0000 mg | RECTAL | Status: DC | PRN

## 2018-03-23 MED ORDER — FERROUS SULFATE 325 (65 FE) MG PO TABS
325.0000 mg | ORAL_TABLET | Freq: Every day | ORAL | Status: DC
  Administered 2018-03-23 – 2018-03-25 (×3): 325 mg via ORAL
  Filled 2018-03-23 (×3): qty 1

## 2018-03-23 MED ORDER — IOPAMIDOL (ISOVUE-370) INJECTION 76%
INTRAVENOUS | Status: AC
  Administered 2018-03-23: 50 mL
  Filled 2018-03-23: qty 50

## 2018-03-23 MED ORDER — MAGNESIUM 200 MG PO TABS
200.0000 mg | ORAL_TABLET | Freq: Every day | ORAL | Status: DC
  Filled 2018-03-23: qty 1

## 2018-03-23 MED ORDER — MAGNESIUM OXIDE 400 (241.3 MG) MG PO TABS
200.0000 mg | ORAL_TABLET | Freq: Every day | ORAL | Status: DC
  Administered 2018-03-23 – 2018-03-24 (×2): 200 mg via ORAL
  Filled 2018-03-23 (×2): qty 1

## 2018-03-23 MED ORDER — ACETAMINOPHEN 325 MG PO TABS: 650.0000 mg | ORAL_TABLET | ORAL | Status: DC | PRN

## 2018-03-23 MED ORDER — STROKE: EARLY STAGES OF RECOVERY BOOK
Freq: Once | Status: AC
  Administered 2018-03-23: 11:00:00
  Filled 2018-03-23: qty 1

## 2018-03-23 MED ORDER — ACETAMINOPHEN 500 MG PO TABS
500.0000 mg | ORAL_TABLET | Freq: Every day | ORAL | Status: DC
  Administered 2018-03-23 – 2018-03-25 (×3): 500 mg via ORAL
  Filled 2018-03-23 (×3): qty 1

## 2018-03-23 MED ORDER — RISAQUAD PO CAPS
1.0000 | ORAL_CAPSULE | Freq: Every day | ORAL | Status: DC
  Administered 2018-03-23 – 2018-03-25 (×3): 1 via ORAL
  Filled 2018-03-23 (×3): qty 1

## 2018-03-23 MED ORDER — APIXABAN 2.5 MG PO TABS
2.5000 mg | ORAL_TABLET | Freq: Two times a day (BID) | ORAL | Status: DC
  Administered 2018-03-23 – 2018-03-24 (×2): 2.5 mg via ORAL
  Filled 2018-03-23 (×4): qty 1

## 2018-03-23 MED ORDER — VITAMIN D 1000 UNITS PO TABS
500.0000 [IU] | ORAL_TABLET | Freq: Every day | ORAL | Status: DC
  Administered 2018-03-23 – 2018-03-24 (×2): 500 [IU] via ORAL
  Filled 2018-03-23 (×2): qty 1

## 2018-03-23 MED ORDER — SODIUM CHLORIDE 0.9 % IV SOLN
INTRAVENOUS | Status: DC
  Administered 2018-03-23 – 2018-03-24 (×2): via INTRAVENOUS
  Administered 2018-03-25: 1000 mL via INTRAVENOUS

## 2018-03-23 NOTE — H&P (Addendum)
History and Physical    Tom Wang WUJ:[id number] DOB: [birth year] DOA: 03/23/2018  PCP: [name]   I have briefly reviewed patients previous medical reports in [facility].  Patient coming from: Home  Chief Complaint: Slurred speech and drooping left eye.  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  Patient had mostly his granddaughter who is also the healthcare power of attorney provided history.  At approximately 12:30 AM on night of admission, patient was getting ready to go to bed, was sitting up in bed when family noticed that he was having drooping of his left upper eyelid along with slurred speech.  He has chronic mild facial asymmetry and they did not notice any worsening of that.  Patient did not report any worsening of his left-sided weakness, tingling or numbness.  No chest pain, palpitations, dyspnea, dizziness or lightheadedness reported.  He has chronic left sided upper and lower teeth pain for which she has seen a dentist but reluctant to undergo dental procedures.  Denies eye pain.  Has some watering of his left eye but no itching.  Family called PCP who advised patient to come to the ED for possible stroke.  Slurred speech has significantly improved but speech is not yet at baseline after almost 8 hours.  Reported left eyelid drooping persists.  No other complaints reported.  ED Course: Hypertensive in the ED.  Lab work shows hemoglobin of 11.6, glucose 217, urine microscopy negative for UTI features, chest x-ray without acute findings, CT head and MRI brain without acute stroke.  MRI  of the head shows left preseptal soft tissue swelling but no clinical cellulitis.  Being admitted for TIA evaluation.  Neurology has been consulted.  As per ED RN, passed bedside swallow screen.  Review of Systems:  All other systems reviewed and apart from HPI, are negative.  Past Medical History:  Diagnosis Date  . Arthritis    ankles  . Diabetes mellitus    oral meds only  . GERD (gastroesophageal reflux disease)   . Hypertension   . Pneumonia    20 years ago .  Marland Kitchen Stroke [facility])     Past Surgical History:  Procedure Laterality Date  . CATARACT EXTRACTION W/PHACO  04/26/2012   Procedure: CATARACT EXTRACTION PHACO AND INTRAOCULAR LENS PLACEMENT (IOC);  Surgeon: Chalmers Guest, MD;  Location: [facility] OR;  Service: Ophthalmology;  Laterality: Right;  . TONSILLECTOMY    . TUMOR EXCISION     60 years ago .. right forearm .     Social History  reports that he has never smoked. He has never used smokeless tobacco. He reports that he does not drink alcohol or use drugs.  Allergies  Allergen Reactions  . Morphine And Related Other (See Comments)    SEVERE REACTION  . Other Other (See Comments)    NO NARCOTICS PER FAMILY  . Gabapentin Other (See Comments)    AGITATION, COMBATIVE  . Lantus [Insulin Glargine] Other (See Comments)    unknown  . Penicillins Other (See Comments)    UNKNOWN    Family History  Problem Relation Age of Onset  . Other Grandchild  Patient's granddaughter reports that she has TMJ dysfunction.   Prior to Admission medications   Medication Sig Start Date End Date Taking? Authorizing Provider  acetaminophen (TYLENOL) 500 MG tablet Take 500 mg by mouth daily.    Yes [provider]  Acetylcarnitine HCl (ACETYL L-CARNITINE PO) Take 400 mg by mouth daily.   Yes [provider]  Alpha-Lipoic Acid 200 MG TABS Take 200 mg by mouth daily.   Yes [provider]  apixaban (ELIQUIS) 2.5 MG TABS tablet Take 2.5 mg by mouth 2 (two) times daily.    Yes [provider]  Calcium Citrate 333 MG TABS Take 333 mg by mouth daily.   Yes [provider]  cholecalciferol (VITAMIN D) 1000 UNITS tablet Take 500 Units by mouth at bedtime.    Yes [provider]  ferrous sulfate 325 (65 FE) MG EC tablet Take 325 mg by mouth daily with breakfast.   Yes [provider]  glipiZIDE (GLUCOTROL) 10 MG tablet Take 10 mg by mouth 2 (two) times daily before a meal.   Yes [provider]  Lactobacillus Rhamnosus, GG, (CULTURELLE) CAPS Take 1 capsule by mouth daily.   Yes [provider]  losartan (COZAAR) 25 MG tablet Take 12.5 mg by mouth daily.    Yes [provider]  Magnesium 250 MG TABS Take 250 mg by mouth at bedtime.   Yes [provider]  Misc Natural Products (TURMERIC CURCUMIN) CAPS Take 1 capsule by mouth daily.    Yes [provider]  Multiple Vitamin (MULTIVITAMIN WITH MINERALS) TABS Take 1 tablet by mouth daily.    Yes [provider]  omeprazole (PRILOSEC) 20 MG capsule Take 20 mg by mouth daily.   Yes [provider]  senna-docusate (SENOKOT-S) 8.6-50 MG tablet Take 2 tablets by mouth daily.   Yes [provider]  simvastatin (ZOCOR) 40 MG tablet Take 20 mg by mouth daily at 6 PM.   Yes [provider]  vitamin B-12 (CYANOCOBALAMIN) 1000 MCG tablet Take 1,000 mcg by mouth daily.   Yes [provider]    Physical Exam: Vitals:   03/23/18 0445 03/23/18 0635 03/23/18 0645 03/23/18 0700  BP: (!) 151/65 (!) 179/61 (!) 180/64 (!) 172/54  Pulse: 77 (!) 109 (!) 50 (!) 51  Resp: 20 16 20  (!) 22  Temp:      TempSrc:      SpO2: 96% (!) 81% 98% 98%  Weight:      Height:          Constitutional: Pleasant elderly male, moderately built and obese, lying comfortably propped up in bed. Eyes: PERTLA.  Bilateral arcus analysis.  Mild swelling of left upper eyelid and minimal left temporal conjunctival congestion and increased  watering of left eye but no photophobia. ENMT: Mucous membranes are moist. Posterior pharynx clear of any exudate or lesions.  Missing multiple teeth. No acute dental findings. Neck: supple, no masses, no thyromegaly Respiratory: clear to auscultation bilaterally, no wheezing, no crackles. Normal respiratory effort. No accessory muscle use.  Cardiovascular: S1 & S2 heard, regular rate and rhythm, no murmurs / rubs / gallops.  1+ pitting lower extremity edema. 2+ pedal pulses. No carotid bruits.  Telemetry personally reviewed: Sinus bradycardia in the 50s. Abdomen: No distension, no tenderness, no masses palpated. No hepatosplenomegaly. Bowel sounds normal.  Musculoskeletal: no clubbing / cyanosis. No joint deformity upper and lower extremities. Good ROM, no contractures. Normal muscle tone.  Skin: no rashes, lesions, ulcers.  No induration Neurologic: Mild dysarthria present.  Diminished left nasolabial fold prominence, reportedly chronic and unchanged.  No true left eye droop/ptosis.  The left upper eyelid findings are likely related to mild swelling of the region above the eyelid?  Allergic.  Left upper extremity grade 4 x 5 power, left lower extremity grade 2 x 5 power.  Right upper extremity grade 5 x 5 power.  Right lower extremity grade 2-3 x 5 power. Psychiatric: Impaired judgment and insight. Alert and oriented x 3. Normal mood.     Labs on Admission: I have personally reviewed following labs and imaging studies  CBC: Recent Labs  Lab 03/23/18 0317  WBC 7.9  NEUTROABS 4.6  HGB 11.6*  HCT 36.4*  MCV 81.3  PLT 185   Basic Metabolic Panel: Recent Labs  Lab 03/23/18 0317  NA 139  K 3.8  CL 106  CO2 26  GLUCOSE 217*  BUN 14  CREATININE 0.94  CALCIUM 9.7   Liver Function Tests: Recent Labs  Lab 03/23/18 0317  AST 18  ALT 12  ALKPHOS 57  BILITOT 0.7  PROT 6.6  ALBUMIN 3.5   Coagulation Profile: Recent Labs  Lab 03/23/18 0317  INR 1.11   Urine analysis:      Component Value Date/Time   COLORURINE COLORLESS (A) 03/23/2018 0245   APPEARANCEUR CLEAR 03/23/2018 0245   LABSPEC 1.002 (L) 03/23/2018 0245   PHURINE 7.0 03/23/2018 0245   GLUCOSEU NEGATIVE 03/23/2018 0245   HGBUR SMALL (A) 03/23/2018 0245   BILIRUBINUR NEGATIVE 03/23/2018 0245   KETONESUR NEGATIVE 03/23/2018 0245   PROTEINUR NEGATIVE 03/23/2018 0245   UROBILINOGEN 0.2 12/13/2014 2011   NITRITE NEGATIVE 03/23/2018 0245   LEUKOCYTESUR NEGATIVE 03/23/2018 0245     Radiological Exams on Admission: Dg Chest 2 View  Result Date: 03/23/2018 CLINICAL DATA:  TIA. EXAM: CHEST - 2 VIEW COMPARISON:  Radiograph 08/29/2016 FINDINGS: Unchanged heart size and mediastinal contours with borderline cardiomegaly. Aortic atherosclerosis. No pulmonary edema, focal consolidation, pleural effusion or pneumothorax. Remote right clavicle fracture. IMPRESSION: 1. No acute findings. 2.  Aortic Atherosclerosis (ICD10-I70.0). Electronically Signed   By: Rubye Oaks M.D.   On: 03/23/2018 03:28   Ct Head Wo Contrast  Result Date: 03/23/2018 CLINICAL DATA:  New onset LEFT eye droop. Chronic LEFT-sided weakness. History of stroke, hypertension, diabetes. EXAM: CT HEAD WITHOUT CONTRAST TECHNIQUE: Contiguous axial images were obtained from the base of the skull through the vertex without intravenous contrast. COMPARISON:  CT HEAD Dec 13, 2014 FINDINGS: BRAIN: No intraparenchymal hemorrhage, mass effect nor midline shift. The ventricles and sulci are normal for age. Patchy supratentorial white matter hypodensities within normal range for patient's age, though non-specific are most compatible with chronic small vessel ischemic disease. No acute large vascular territory infarcts. No abnormal extra-axial fluid collections. Basal cisterns are patent. VASCULAR: Mild calcific atherosclerosis of the carotid siphons. SKULL: No skull fracture. Multiple dental caries. No significant scalp soft tissue swelling. SINUSES/ORBITS:  Trace paranasal sinus mucosal thickening. Mastoid air cells are well aerated.The included ocular globes and orbital contents are non-suspicious. Status post RIGHT ocular lens implant. OTHER: LEFT preseptal swelling with soft tissue density. No subcutaneous gas or radiopaque foreign bodies. IMPRESSION: 1. LEFT preseptal soft tissue swelling, potential cellulitis or mass. 2. Negative non-contrast CT HEAD for age. Electronically Signed   By: Awilda Metro M.D.   On: 03/23/2018 03:23   Mr Maxine Glenn Head Wo Contrast  Result Date: 03/23/2018 CLINICAL DATA:  LEFT eye droop, garbled speech for 30  minutes. Now at baseline. History of stroke, hypertension, diabetes, anticoagulation. EXAM: MRI HEAD WITHOUT CONTRAST MRA HEAD WITHOUT CONTRAST TECHNIQUE: Multiplanar, multiecho pulse sequences of the brain and surrounding structures were obtained without intravenous contrast. Angiographic images of the head were obtained using MRA technique without contrast. COMPARISON:  CT HEAD March 23, 2018. MRI head report dated June 15, 2013 though images are not available for direct comparison. FINDINGS: MRI HEAD FINDINGS-moderately motion degraded examination. INTRACRANIAL CONTENTS: No reduced diffusion to suggest acute ischemia. No susceptibility artifact to suggest hemorrhage. The ventricles and sulci are normal for patient's age. Patchy to confluent pontine and supratentorial white matter FLAIR T2 hyperintensities tracking into RIGHT internal capsule. Old RIGHT basal ganglia lacunar infarct. No suspicious parenchymal signal, masses, mass effect. No abnormal extra-axial fluid collections. No extra-axial masses. VASCULAR: Normal major intracranial vascular flow voids present at skull base. SKULL AND UPPER CERVICAL SPINE: No abnormal sellar expansion. No suspicious calvarial bone marrow signal. Craniocervical junction maintained. SINUSES/ORBITS: Trace paranasal sinus mucosal thickening. LEFT maxillary mucosal retention cyst. Trace  LEFT mastoid effusion. The included ocular globes and orbital contents are non-suspicious. Status post RIGHT ocular lens implant. OTHER: None. MRA HEAD FINDINGS ANTERIOR CIRCULATION: Normal flow related enhancement of the included cervical, petrous, cavernous and supraclinoid internal carotid arteries. Mild luminal irregularity RIGHT supraclinoid internal carotid artery. Patent anterior communicating artery. Patent anterior and middle cerebral arteries. POSTERIOR CIRCULATION: Due to motion, vertebral arteries not well characterized. Tandem loss of basilar artery flow related enhancement. Robust bilateral posterior communicating arteries present. Patent posterior cerebral arteries, moderate stenosis seen with atherosclerosis or motion artifact. ANATOMIC VARIANTS: Aplastic RIGHT A1 segment. Source images and MIP images were reviewed. IMPRESSION: MRI HEAD: 1. No acute intracranial process on this moderately motion degraded examination. 2. Moderate to severe chronic small vessel ischemic changes. Old RIGHT basal ganglia lacunar infarct. MRA HEAD: 1. Moderately motion degraded examination. No emergent large vessel occlusion. 2. Intermittent loss of basilar artery signal concerning for stenosis though, may be overestimated by motion artifact. Findings would be better demonstrated on CTA HEAD as clinically indicated. Electronically Signed   By: Awilda Metro M.D.   On: 03/23/2018 06:11   Mr Brain Wo Contrast  Result Date: 03/23/2018 CLINICAL DATA:  LEFT eye droop, garbled speech for 30 minutes. Now at baseline. History of stroke, hypertension, diabetes, anticoagulation. EXAM: MRI HEAD WITHOUT CONTRAST MRA HEAD WITHOUT CONTRAST TECHNIQUE: Multiplanar, multiecho pulse sequences of the brain and surrounding structures were obtained without intravenous contrast. Angiographic images of the head were obtained using MRA technique without contrast. COMPARISON:  CT HEAD March 23, 2018. MRI head report dated June 15, 2013 though images are not available for direct comparison. FINDINGS: MRI HEAD FINDINGS-moderately motion degraded examination. INTRACRANIAL CONTENTS: No reduced diffusion to suggest acute ischemia. No susceptibility artifact to suggest hemorrhage. The ventricles and sulci are normal for patient's age. Patchy to confluent pontine and supratentorial white matter FLAIR T2 hyperintensities tracking into RIGHT internal capsule. Old RIGHT basal ganglia lacunar infarct. No suspicious parenchymal signal, masses, mass effect. No abnormal extra-axial fluid collections. No extra-axial masses. VASCULAR: Normal major intracranial vascular flow voids present at skull base. SKULL AND UPPER CERVICAL SPINE: No abnormal sellar expansion. No suspicious calvarial bone marrow signal. Craniocervical junction maintained. SINUSES/ORBITS: Trace paranasal sinus mucosal thickening. LEFT maxillary mucosal retention cyst. Trace LEFT mastoid effusion. The included ocular globes and orbital contents are non-suspicious. Status post RIGHT ocular lens implant. OTHER: None. MRA HEAD FINDINGS ANTERIOR CIRCULATION: Normal flow related enhancement of the included  cervical, petrous, cavernous and supraclinoid internal carotid arteries. Mild luminal irregularity RIGHT supraclinoid internal carotid artery. Patent anterior communicating artery. Patent anterior and middle cerebral arteries. POSTERIOR CIRCULATION: Due to motion, vertebral arteries not well characterized. Tandem loss of basilar artery flow related enhancement. Robust bilateral posterior communicating arteries present. Patent posterior cerebral arteries, moderate stenosis seen with atherosclerosis or motion artifact. ANATOMIC VARIANTS: Aplastic RIGHT A1 segment. Source images and MIP images were reviewed. IMPRESSION: MRI HEAD: 1. No acute intracranial process on this moderately motion degraded examination. 2. Moderate to severe chronic small vessel ischemic changes. Old RIGHT basal ganglia  lacunar infarct. MRA HEAD: 1. Moderately motion degraded examination. No emergent large vessel occlusion. 2. Intermittent loss of basilar artery signal concerning for stenosis though, may be overestimated by motion artifact. Findings would be better demonstrated on CTA HEAD as clinically indicated. Electronically Signed   By: Awilda Metroourtnay  Bloomer M.D.   On: 03/23/2018 06:11    EKG: Was not done.  Ordered.  Assessment/Plan Principal Problem:   TIA (transient ischemic attack) Active Problems:   Essential hypertension   Hyperlipidemia   DM type 2 (diabetes mellitus, type 2) (HCC)   H/O: CVA (cerebrovascular accident)   Left hemiparesis (HCC)   Chronic anticoagulation- for h/o LLE DVT, on Eliquis.     Suspected TIA: Resultant dysarthria but no true left eye ptosis.  Neurology consulted and assisting with evaluation.  CT head and MRI had negative for acute stroke. MRA Head report as above and additional work up as below. Complete stroke work-up including hemoglobin A1c, fasting lipids, 2D echo and carotid Dopplers.  Since on Eliquis anticoagulation, antiplatelets not added and will defer to neurology.  Patient is nonambulatory at baseline and hence not sure benefit of therapies but will request PT, OT and ST evaluation.  Abnormal MRA Brain: Intermittent loss of basilar artery signal concerning for Stenosis. Discussed with Neurology who obtained CTA Head and Neck and will follow up.  Prior right brain stroke with residual left hemiparesis: At baseline.  Continue evaluation as above and continue Eliquis.  Essential hypertension: Allow permissive hypertension due to suspected TIA.  Hold ARB for today.  Hyperlipidemia: Continue statins.  Check fasting lipids.  Type II DM: Patient apparently allergic to Lantus.  Continue prior home oral hypoglycemics with close monitoring.  Check A1c.  Left preseptal soft tissue swelling: Unclear etiology.?  Allergic reaction?  Mild viral conjunctivitis- not  pinkeye currently.  This is likely contributing to drooping eyelid rather than neurological etiology.  Supportive treatment.  May consider outpatient ophthalmology consultation.  History of left lower extremity DVT: Continue chronic Eliquis.  Iron deficiency anemia: Continue iron supplements and follow CBCs as outpatient.   DVT prophylaxis: Already on Eliquis anticoagulation. Code Status: Full.  Confirmed with granddaughter/healthcare power of attorney at bedside. Family Communication: Discussed in detail with patient's granddaughter/healthcare power of attorney at bedside. Disposition Plan: DC home pending clinical improvement and completion of TIA work-up. Consults called: Neurology Admission status: Observation, telemetry.   Severity of Illness: The appropriate patient status for this patient is OBSERVATION. Observation status is judged to be reasonable and necessary in order to provide the required intensity of service to ensure the patient's safety. The patient's presenting symptoms, physical exam findings, and initial radiographic and laboratory data in the context of their medical condition is felt to place them at decreased risk for further clinical deterioration. Furthermore, it is anticipated that the patient will be medically stable for discharge from the hospital within 2 midnights of admission.  The following factors support the patient status of observation.   " The patient's presenting symptoms include drooping of left upper eyelid and slurred speech. " The physical exam findings include mild dysarthria, minimal left upper eyelid swelling, chronic left hemiparesis, 1+ pitting bilateral leg edema " The initial radiographic and laboratory data are hemoglobin in the 11 range, glucose in the 200 range, CT head and MRI brain negative for stroke but CT head shows left preseptal swelling.Marcellus Scott MD [facility] Pager [phone]  If 7PM-7AM, please contact  night-coverage [email or web address] Password [facility]  03/23/2018, 8:33 AM

## 2018-03-23 NOTE — ED Notes (Signed)
Pt comes in from home via EMS for new onset of left eye drooping. Family advised this is new for him but he does have left sided weakness which is normal for him from a previous stroke. Family advised the patient is acting normal and nothing has changed other then the eye drooping.

## 2018-03-23 NOTE — Evaluation (Signed)
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   Clinical Impression  Patient presents with decreased mobility due to generalized weakness and will benefit from skilled PT in the acute setting to allow return home with family support and follow up HHPT.     Follow Up Recommendations Supervision/Assistance - 24 hour;Home health PT;Other (comment)(HH aide)    Equipment Recommendations  None recommended by PT    Recommendations for Other Services       Precautions / Restrictions Precautions Precautions: Fall      Mobility  Bed Mobility Overal bed mobility: Needs Assistance Bed Mobility: Sit to Supine       Sit to supine: Mod assist;+2 for physical assistance   General bed mobility comments: assist for legs and trunk  Transfers Overall transfer level: Needs assistance   Transfers: Sit to/from Stand;Stand Pivot Transfers Sit to Stand: +2 safety/equipment;Max assist Stand pivot transfers: +2 safety/equipment;Max assist       General transfer comment: [facility]  Ambulation/Gait                Stairs            Wheelchair Mobility    Modified Rankin (Stroke Patients Only)       Balance Overall balance assessment: Needs assistance Sitting-balance support: Bilateral upper extremity supported Sitting balance-Leahy Scale: Poor Sitting balance - Comments: initially assist for balance, then [facility] and back to bed S                                     Pertinent Vitals/Pain Pain  Assessment: No/denies pain    Home Living Family/patient expects to be discharged to:: Private residence Living Arrangements: Children Available Help at Discharge: Family;Available 24 hours/day Type of Home: House Home Access: Ramped entrance     Home Layout: One level Home Equipment: Wheelchair - manual;Hospital bed;Other (comment) Additional Comments: stand lift    Prior Function Level of Independence: Needs assistance   Gait / Transfers Assistance Needed: assist with lift to transfer  ADL's / Homemaking Assistance Needed: assist        Hand Dominance        Extremity/Trunk Assessment   Upper Extremity Assessment Upper Extremity Assessment: Generalized weakness    Lower Extremity Assessment Lower Extremity Assessment: Generalized weakness(tingling in feet)       Communication   Communication: No difficulties  Cognition Arousal/Alertness: Awake/alert Behavior During Therapy: WFL for tasks assessed/performed Overall Cognitive Status: Within Functional Limits for tasks assessed                                        General Comments General comments (skin integrity, edema, etc.): daughter in room and gives history    Exercises     Assessment/Plan    PT Assessment Patient needs continued PT services  PT Problem List Decreased strength;Decreased mobility;Decreased balance;Decreased knowledge of use of DME  PT Treatment Interventions DME instruction;Therapeutic activities;Therapeutic exercise;Patient/family education;Functional mobility training;Balance training    PT Goals (Current goals can be found in the Care Plan section)  Acute Rehab PT Goals Patient Stated Goal: to return home PT Goal Formulation: With patient/family Time For Goal Achievement: 03/30/18 Potential to Achieve Goals: Good    Frequency Min 3X/week   Barriers to discharge        Co-evaluation               AM-PAC PT "6 Clicks" Daily Activity   Outcome Measure Difficulty turning over in bed (including adjusting bedclothes, sheets and blankets)?: Unable Difficulty moving from lying on back to sitting on the side of the bed? : Unable Difficulty sitting down on and standing up from a chair with arms (e.g., wheelchair, bedside commode, etc,.)?: Unable Help needed moving to and from a bed to chair (including a wheelchair)?: Total Help needed walking in hospital room?: Total Help needed climbing 3-5 steps with a railing? : Total 6 Click Score: 6    End of Session Equipment Utilized During Treatment: Other (comment)(sara plus) Activity Tolerance: Patient tolerated treatment well Patient left: in bed;with call bell/phone within reach;with bed alarm set;with family/visitor present   PT Visit Diagnosis: Other abnormalities of gait and mobility (R26.89)    Time: 1610-9604 PT Time Calculation (min) (ACUTE ONLY): 27 min   Charges:   PT Evaluation $PT Eval Moderate Complexity: 1 Mod PT Treatments $Therapeutic Activity: 8-22 mins        [place], [place] [phone] 03/23/2018   Elray Mcgregor 03/23/2018, 5:00 PM

## 2018-03-23 NOTE — Progress Notes (Signed)
Preliminary notes--Bilateral carotid duplex exam completed. Bilateral ICAs 1-39% stenosis. Bilateral vertebral arteries patent with antegrade flow.  Hongying Radin Raptis (RDMS RVT) 03/23/18 11:28 AM

## 2018-03-23 NOTE — Progress Notes (Signed)
Inpatient Diabetes Program Recommendations  AACE/ADA: New Consensus Statement on Inpatient Glycemic Control (2015)  Target Ranges:  Prepandial:   less than 140 mg/dL      Peak postprandial:   less than 180 mg/dL (1-2 hours)      Critically ill patients:  140 - 180 mg/dL   Lab Results  Component Value Date   GLUCAP 137 (H) 08/30/2016    Review of Glycemic Control Results for Tom Wang, Tom Wang (MRN [id number]) as of 03/23/2018 14:22  Ref. Range 03/23/2018 03:17  Glucose Latest Ref Range: 70 - 99 mg/dL 409217 (H)   Diabetes history: Type 2 DM Outpatient Diabetes medications: Glipizide 10 mg BID Current orders for Inpatient glycemic control: Glipizide 10 mg BID  Inpatient Diabetes Program Recommendations:    Noted A1C ordered.   Given BS in ED of 217 mg/dL, may want to order CBGS for AC  + HS under glycemic control order set.   Will continue to follow.   Thanks, Lujean RaveLauren Louiza Moor, MSN, RNC-OB Diabetes Coordinator [phone] (8a-5p)

## 2018-03-23 NOTE — ED Notes (Signed)
Pt returned from MRI °

## 2018-03-23 NOTE — ED Provider Notes (Signed)
[facility] EMERGENCY DEPARTMENT Provider Note   CSN: [id number] Arrival date & time: 03/23/18  0150     History   Chief Complaint Chief Complaint  Patient presents with  . Focal Neuro Symptoms    HPI Tom Wang is a 84 y.o. male.  The history is provided by the patient and a relative.  He has history of hypertension, diabetes, stroke with residual left-sided weakness and comes in after possibly having had a stroke or TIA at home.  His daughter noted that his left eye seemed to be drooping and his speech was more garbled than normal.  This lasted about 30 minutes.  He is back to baseline now.  He denies headache and denies chest pain.  He does take apixaban because of history of prior DVT.  He is not on any aspirin or antiplatelet medications.  Past Medical History:  Diagnosis Date  . Arthritis    ankles  . Diabetes mellitus    oral meds only  . GERD (gastroesophageal reflux disease)   . Hypertension   . Pneumonia    20 years ago .  Marland Kitchen Stroke [facility])     Patient Active Problem List   Diagnosis Date Noted  . Influenza A 08/29/2016  . Gastroesophageal reflux disease without esophagitis 08/29/2016  . Essential hypertension 08/29/2016  . Hyperlipidemia 08/29/2016  . Hyperlipidemia 08/29/2016  . Weakness   . Dehydration     Past Surgical History:  Procedure Laterality Date  . CATARACT EXTRACTION W/PHACO  04/26/2012   Procedure: CATARACT EXTRACTION PHACO AND INTRAOCULAR LENS PLACEMENT (IOC);  Surgeon: Chalmers Guest, MD;  Location: [facility] OR;  Service: Ophthalmology;  Laterality: Right;  . TONSILLECTOMY    . TUMOR EXCISION     60 years ago .. right forearm .         Home Medications    Prior to Admission medications   Medication Sig Start Date End Date Taking? Authorizing Provider  acetaminophen (TYLENOL) 500 MG tablet Take 500 mg by mouth every morning.    [provider]  Acetylcarnitine HCl (ACETYL L-CARNITINE PO) Take 400 mg by mouth  daily.    [provider]  Alpha-Lipoic Acid 200 MG TABS Take 200 mg by mouth daily.    [provider]  apixaban (ELIQUIS) 2.5 MG TABS tablet Take 2.5 mg by mouth 2 (two) times daily.    [provider]  Calcium Citrate 333 MG TABS Take 333 mg by mouth daily.    [provider]  cholecalciferol (VITAMIN D) 1000 UNITS tablet Take 500 Units by mouth at bedtime.     [provider]  ferrous sulfate 325 (65 FE) MG EC tablet Take 325 mg by mouth daily with breakfast.    [provider]  glipiZIDE (GLUCOTROL) 10 MG tablet Take 10 mg by mouth 2 (two) times daily before a meal.    [provider]  guaiFENesin (MUCINEX) 600 MG 12 hr tablet Take 1 tablet (600 mg total) by mouth 2 (two) times daily. 08/30/16   Ghimire, Werner Lean, MD  Lactobacillus Rhamnosus, GG, (CULTURELLE) CAPS Take 1 capsule by mouth daily.    [provider]  losartan (COZAAR) 25 MG tablet Take 12.5 mg by mouth every morning.     [provider]  Magnesium 250 MG TABS Take 250 mg by mouth at bedtime.    [provider]  Misc Natural Products (TURMERIC CURCUMIN) CAPS Take 1 capsule by mouth daily.     [provider]  Multiple Vitamin (MULTIVITAMIN WITH MINERALS) TABS Take 1 tablet by mouth daily.     [provider]  omeprazole (PRILOSEC) 20 MG capsule Take 20 mg by mouth daily.    [provider]  oseltamivir (TAMIFLU) 30 MG capsule Take 1 capsule (30 mg total) by mouth 2 (two) times daily. 08/30/16   Ghimire, Werner LeanShanker M, MD  senna-docusate (SENOKOT-S) 8.6-50 MG tablet Take 2 tablets by mouth daily.    [provider]  simvastatin (ZOCOR) 40 MG tablet Take 20 mg by mouth daily at 6 PM.    [provider]  vitamin B-12 (CYANOCOBALAMIN) 1000 MCG tablet Take 1,000 mcg by mouth daily.    [provider]  zinc gluconate 50 MG tablet Take 50 mg by mouth daily.    [provider]    Family  History No family history on file.  Social History Social History   Tobacco Use  . Smoking status: Never Smoker  . Smokeless tobacco: Never Used  Substance Use Topics  . Alcohol use: No  . Drug use: No     Allergies   Morphine and related; Other; Gabapentin; and Penicillins   Review of Systems Review of Systems  All other systems reviewed and are negative.    Physical Exam Updated Vital Signs BP (!) 185/65 (BP Location: Left Arm)   Pulse (!) 56   Temp 98.8 F (37.1 C) (Oral)   Resp 18   Ht 5\' 9"  (1.753 m)   Wt 116 kg   SpO2 100%   BMI 37.77 kg/m   Physical Exam  Nursing note and vitals reviewed.  84 year old male, resting comfortably and in no acute distress. Vital signs are significant for elevated systolic blood pressure and mild bradycardia. Oxygen saturation is 100%, which is normal. Head is normocephalic and atraumatic. PERRLA, EOMI. Oropharynx is clear. Neck is nontender and supple without adenopathy or JVD.  There are no carotid bruits. Back is nontender and there is no CVA tenderness. Lungs are clear without rales, wheezes, or rhonchi. Chest is nontender. Heart has regular rate and rhythm without murmur. Abdomen is soft, flat, nontender without masses or hepatosplenomegaly and peristalsis is normoactive. Extremities have no cyanosis or edema, full range of motion is present. Skin is warm and dry without rash. Neurologic: Mental status is normal, there is a mild left central facial droop, other cranial nerves are intact.  There is mild left arm weakness with strength 4/5 and moderate to severe left leg weakness with strength 3/5.  Right arm and right leg strength is 5/5.  There is mild left pronator drift.  Sensory exam is normal.  Daughter states that the observed left-sided weakness is his baseline.  ED Treatments / Results  Labs (all labs ordered are listed, but only abnormal results are displayed) Labs Reviewed  APTT - Abnormal; Notable for the  following components:      Result Value   aPTT 38 (*)    All other components within normal limits  CBC - Abnormal; Notable for the following components:   Hemoglobin 11.6 (*)    HCT 36.4 (*)    MCH 25.9 (*)    All other components within normal limits  DIFFERENTIAL - Abnormal; Notable for the following components:   Monocytes Absolute 1.1 (*)    All other components within normal limits  COMPREHENSIVE METABOLIC PANEL - Abnormal; Notable for the following components:   Glucose, Bld 217 (*)    All other components within  normal limits  URINALYSIS, ROUTINE W REFLEX MICROSCOPIC - Abnormal; Notable for the following components:   Color, Urine COLORLESS (*)    Specific Gravity, Urine 1.002 (*)    Hgb urine dipstick SMALL (*)    All other components within normal limits  ETHANOL  PROTIME-INR  RAPID URINE DRUG SCREEN, HOSP PERFORMED  I-STAT TROPONIN, ED   Radiology Dg Chest 2 View  Result Date: 03/23/2018 CLINICAL DATA:  TIA. EXAM: CHEST - 2 VIEW COMPARISON:  Radiograph 08/29/2016 FINDINGS: Unchanged heart size and mediastinal contours with borderline cardiomegaly. Aortic atherosclerosis. No pulmonary edema, focal consolidation, pleural effusion or pneumothorax. Remote right clavicle fracture. IMPRESSION: 1. No acute findings. 2.  Aortic Atherosclerosis (ICD10-I70.0). Electronically Signed   By: Rubye Oaks M.D.   On: 03/23/2018 03:28   Ct Head Wo Contrast  Result Date: 03/23/2018 CLINICAL DATA:  New onset LEFT eye droop. Chronic LEFT-sided weakness. History of stroke, hypertension, diabetes. EXAM: CT HEAD WITHOUT CONTRAST TECHNIQUE: Contiguous axial images were obtained from the base of the skull through the vertex without intravenous contrast. COMPARISON:  CT HEAD Dec 13, 2014 FINDINGS: BRAIN: No intraparenchymal hemorrhage, mass effect nor midline shift. The ventricles and sulci are normal for age. Patchy supratentorial white matter hypodensities within normal range for patient's  age, though non-specific are most compatible with chronic small vessel ischemic disease. No acute large vascular territory infarcts. No abnormal extra-axial fluid collections. Basal cisterns are patent. VASCULAR: Mild calcific atherosclerosis of the carotid siphons. SKULL: No skull fracture. Multiple dental caries. No significant scalp soft tissue swelling. SINUSES/ORBITS: Trace paranasal sinus mucosal thickening. Mastoid air cells are well aerated.The included ocular globes and orbital contents are non-suspicious. Status post RIGHT ocular lens implant. OTHER: LEFT preseptal swelling with soft tissue density. No subcutaneous gas or radiopaque foreign bodies. IMPRESSION: 1. LEFT preseptal soft tissue swelling, potential cellulitis or mass. 2. Negative non-contrast CT HEAD for age. Electronically Signed   By: Awilda Metro M.D.   On: 03/23/2018 03:23    Procedures Procedures   Medications Ordered in ED Medications - No data to display   Initial Impression / Assessment and Plan / ED Course  I have reviewed the triage vital signs and the nursing notes.  Pertinent labs & imaging results that were available during my care of the patient were reviewed by me and considered in my medical decision making (see chart for details).  Apparent TIA superimposed on prior stroke-all symptoms left-sided.  His stroke was in 2014.  He will need to be admitted for TIA work-up.  Initial CT and MRI scans are ordered as well as screening labs.  Old records are reviewed, and he has no relevant past visits.  CT scan shows no acute intracranial findings, but possible left preseptal swelling.  Clinically, he does not have orbital cellulitis.  Labs are significant only for mild anemia.  Case is discussed with Dr. Clyde Lundborg of [facility] hospitalist, who agrees to admit the patient.  Neurology will be consulted as well.  Final Clinical Impressions(s) / ED Diagnoses   Final diagnoses:  TIA (transient ischemic attack)  Normocytic  anemia    ED Discharge Orders    None       Dione Booze, MD 03/23/18 [phone]

## 2018-03-23 NOTE — ED Notes (Signed)
Meal tray ordered 

## 2018-03-23 NOTE — ED Notes (Addendum)
Patient transported to MRI 

## 2018-03-23 NOTE — Care Management (Signed)
This is a no charge note  Pending admission per Dr. Preston FleetingGlick  84 year old male with past medical history of hypertension, hyperlipidemia, diabetes mellitus, stroke with left-sided weakness, GERD, who presents with slurred speech, facial droop, symptoms lasted for about 30 minutes.  CT head is negative for acute intracranial Mannam mellitus, but showed possible preseptal soft tissue swelling, concerning cellulitis.  Patient does not have fever or leukocytosis.  Patient is placed on telemetry bed of observation.  Pending MRI for brain.  ED physician will consult neurology.    Lorretta HarpXilin Brodi Nery, MD  [facility] Pager [phone]  If 7PM-7AM, please contact night-coverage [email or web address] Password [email or web address] 03/23/2018, 5:43 AM

## 2018-03-23 NOTE — Discharge Instructions (Addendum)
Information on my medicine - ELIQUIS (apixaban)  This medication education was reviewed with me or my healthcare representative as part of my discharge preparation.   Why was Eliquis prescribed for you? Eliquis was prescribed for you to reduce the risk of forming blood clots that can cause a stroke if you have a medical condition called atrial fibrillation (a type of irregular heartbeat) OR to reduce the risk of a blood clots forming after orthopedic surgery.  What do You need to know about Eliquis ? Take your Eliquis TWICE DAILY - one tablet in the morning and one tablet in the evening with or without food.  It would be best to take the doses about the same time each day.  If you have difficulty swallowing the tablet whole please discuss with your pharmacist how to take the medication safely.  Take Eliquis exactly as prescribed by your doctor and DO NOT stop taking Eliquis without talking to the doctor who prescribed the medication.  Stopping may increase your risk of developing a new clot or stroke.  Refill your prescription before you run out.  After discharge, you should have regular check-up appointments with your healthcare provider that is prescribing your Eliquis.  In the future your dose may need to be changed if your kidney function or weight changes by a significant amount or as you get older.  What do you do if you miss a dose? If you miss a dose, take it as soon as you remember on the same day and resume taking twice daily.  Do not take more than one dose of ELIQUIS at the same time.  Important Safety Information A possible side effect of Eliquis is bleeding. You should call your healthcare provider right away if you experience any of the following: Bleeding from an injury or your nose that does not stop. Unusual colored urine (red or dark brown) or unusual colored stools (red or black). Unusual bruising for unknown reasons. A serious fall or if you hit your head (even  if there is no bleeding).  Some medicines may interact with Eliquis and might increase your risk of bleeding or clotting while on Eliquis. To help avoid this, consult your healthcare provider or pharmacist prior to using any new prescription or non-prescription medications, including herbals, vitamins, non-steroidal anti-inflammatory drugs (NSAIDs) and supplements.  This website has more information on Eliquis (apixaban): www.Eliquis.com.    Additional discharge instructions  Please get your medications reviewed and adjusted by your Primary MD.  Please request your Primary MD to go over all Hospital Tests and Procedure/Radiological results at the follow up, please get all Hospital records sent to your Prim MD by signing hospital release before you go home.  If you had Pneumonia of Lung problems at the Hospital: Please get a 2 view Chest X ray done in 6-8 weeks after hospital discharge or sooner if instructed by your Primary MD.  If you have Congestive Heart Failure: Please call your Cardiologist or Primary MD anytime you have any of the following symptoms:  1) 3 pound weight gain in 24 hours or 5 pounds in 1 week  2) shortness of breath, with or without a dry hacking cough  3) swelling in the hands, feet or stomach  4) if you have to sleep on extra pillows at night in order to breathe  Follow cardiac low salt diet and 1.5 lit/day fluid restriction.  If you have diabetes Accuchecks 4 times/day, Once in AM empty stomach and then before each   meal. Log in all results and show them to your primary doctor at your next visit. If any glucose reading is under 80 or above 300 call your primary MD immediately.  If you have Seizure/Convulsions/Epilepsy: Please do not drive, operate heavy machinery, participate in activities at heights or participate in high speed sports until you have seen by Primary MD or a Neurologist and advised to do so again.  If you had Gastrointestinal  Bleeding: Please ask your Primary MD to check a complete blood count within one week of discharge or at your next visit. Your endoscopic/colonoscopic biopsies that are pending at the time of discharge, will also need to followed by your Primary MD.  Get Medicines reviewed and adjusted. Please take all your medications with you for your next visit with your Primary MD  Please request your Primary MD to go over all hospital tests and procedure/radiological results at the follow up, please ask your Primary MD to get all Hospital records sent to his/her office.  If you experience worsening of your admission symptoms, develop shortness of breath, life threatening emergency, suicidal or homicidal thoughts you must seek medical attention immediately by calling 911 or calling your MD immediately  if symptoms less severe.  You must read complete instructions/literature along with all the possible adverse reactions/side effects for all the Medicines you take and that have been prescribed to you. Take any new Medicines after you have completely understood and accpet all the possible adverse reactions/side effects.   Do not drive or operate heavy machinery when taking Pain medications.   Do not take more than prescribed Pain, Sleep and Anxiety Medications  Special Instructions: If you have smoked or chewed Tobacco  in the last 2 yrs please stop smoking, stop any regular Alcohol  and or any Recreational drug use.  Wear Seat belts while driving.  Please note You were cared for by a hospitalist during your hospital stay. If you have any questions about your discharge medications or the care you received while you were in the hospital after you are discharged, you can call the unit and asked to speak with the hospitalist on call if the hospitalist that took care of you is not available. Once you are discharged, your primary care physician will handle any further medical issues. Please note that NO REFILLS for  any discharge medications will be authorized once you are discharged, as it is imperative that you return to your primary care physician (or establish a relationship with a primary care physician if you do not have one) for your aftercare needs so that they can reassess your need for medications and monitor your lab values.  You can reach the hospitalist office at phone [phone] or fax [phone]   If you do not have a primary care physician, you can call [phone] for a physician referral.  

## 2018-03-23 NOTE — Progress Notes (Signed)
PHARMACIST - PHYSICIAN ORDER COMMUNICATION  CONCERNING: P&T Medication Policy on Herbal Medications  DESCRIPTION:  This patient's order for:  Alpha lipoic acid  has been noted.  This product(s) is classified as an "herbal" or natural product. Due to a lack of definitive safety studies or FDA approval, nonstandard manufacturing practices, plus the potential risk of unknown drug-drug interactions while on inpatient medications, the Pharmacy and Therapeutics Committee does not permit the use of "herbal" or natural products of this type within [facility].   ACTION TAKEN: The pharmacy department is unable to verify this order at this time and your patient has been informed of this safety policy. Please reevaluate patient's clinical condition at discharge and address if the herbal or natural product(s) should be resumed at that time.   Shirely Toren A. Jeanella CrazePierce, PharmD, BCPS Clinical Pharmacist [place] Pager: [phone] Please utilize Amion for appropriate phone number to reach the unit pharmacist [facility])

## 2018-03-23 NOTE — Progress Notes (Signed)
  Echocardiogram 2D Echocardiogram has been performed.  Tom SavoyCasey N Keyontae Wang 03/23/2018, 11:37 AM

## 2018-03-23 NOTE — ED Notes (Signed)
Attempted report to [facility], will call back

## 2018-03-23 NOTE — Consult Note (Addendum)
Neurology Consultation  Reason for Consult: TIA/stroke  Referring Physician: Dr. Waymon Amato  CC: Increased slurred speech  History is obtained from: Daughter  HPI: Tom Wang is a 84 y.o. 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  At baseline patient has a left facial droop, weakness of his left arm, bilateral weakness to the point that he is bedbound and needs transfer.  Patient was noted to be last known normal at 00 15 this a.m.  At about 1230 daughter noted that he was dysarthric more than normal.  She was worried that he may be having a CVA.  Thus patient was brought to the hospital.  Patient has not missed any anticoagulants.  She does state that she feels his dysarthria is slightly improving at this time but still present.   LKW: 00 15 03/23/2018 tpa given?: no, on Eliquis and out of the window Premorbid modified Rankin scale (mRS): 5 NIH stroke score of 11   ROS: A 14 point ROS was performed and is negative except as noted in the HPI.   Past Medical History:  Diagnosis Date  . Arthritis    ankles  . Diabetes mellitus    oral meds only  . GERD (gastroesophageal reflux disease)   . Hypertension   . Pneumonia    20 years ago .  [facility] Stroke [facility])      Family History  Problem Relation Age of Onset  . Other Grandchild      Social History:   reports that he has never smoked. He has never used smokeless tobacco. He reports that he does not drink alcohol or use drugs.  Medications No current facility-administered medications for this encounter.   Current Outpatient Medications:  .  acetaminophen (TYLENOL) 500 MG tablet, Take 500 mg by mouth daily. , Disp: , Rfl:  .  Acetylcarnitine HCl (ACETYL L-CARNITINE PO), Take 400 mg by mouth daily., Disp: , Rfl:  .  Alpha-Lipoic Acid 200 MG TABS, Take 200 mg by mouth daily., Disp: , Rfl:  .  apixaban (ELIQUIS) 2.5 MG TABS tablet, Take 2.5 mg by  mouth 2 (two) times daily., Disp: , Rfl:  .  Calcium Citrate 333 MG TABS, Take 333 mg by mouth daily., Disp: , Rfl:  .  cholecalciferol (VITAMIN D) 1000 UNITS tablet, Take 500 Units by mouth at bedtime. , Disp: , Rfl:  .  ferrous sulfate 325 (65 FE) MG EC tablet, Take 325 mg by mouth daily with breakfast., Disp: , Rfl:  .  glipiZIDE (GLUCOTROL) 10 MG tablet, Take 10 mg by mouth 2 (two) times daily before a meal., Disp: , Rfl:  .  Lactobacillus Rhamnosus, GG, (CULTURELLE) CAPS, Take 1 capsule by mouth daily., Disp: , Rfl:  .  losartan (COZAAR) 25 MG tablet, Take 12.5 mg by mouth daily. , Disp: , Rfl:  .  Magnesium 250 MG TABS, Take 250 mg by mouth at bedtime., Disp: , Rfl:  .  Misc Natural Products (TURMERIC CURCUMIN) CAPS, Take 1 capsule by mouth daily. , Disp: , Rfl:  .  Multiple Vitamin (MULTIVITAMIN WITH MINERALS) TABS, Take 1 tablet by mouth daily. , Disp: , Rfl:  .  omeprazole (PRILOSEC) 20 MG capsule, Take 20 mg by mouth daily., Disp: , Rfl:  .  senna-docusate (SENOKOT-S) 8.6-50 MG tablet, Take 2 tablets by mouth daily., Disp: , Rfl:  .  simvastatin (ZOCOR) 40 MG tablet, Take 20 mg by mouth  daily at 6 PM., Disp: , Rfl:  .  vitamin B-12 (CYANOCOBALAMIN) 1000 MCG tablet, Take 1,000 mcg by mouth daily., Disp: , Rfl:    Exam: Current vital signs: BP (!) 172/54   Pulse (!) 51   Temp 98.8 F (37.1 C) (Oral)   Resp (!) 22   Ht 5\' 9"  (1.753 m)   Wt 116 kg   SpO2 98%   BMI 37.77 kg/m  Vital signs in last 24 hours: Temp:  [98.8 F (37.1 C)] 98.8 F (37.1 C) (08/22 0248) Pulse Rate:  [50-109] 51 (08/22 0700) Resp:  [16-22] 22 (08/22 0700) BP: (142-185)/(54-65) 172/54 (08/22 0700) SpO2:  [81 %-100 %] 98 % (08/22 0700) Weight:  [409[116 kg] 116 kg (08/22 0249)  GENERAL: Awake, alert in NAD HEENT: - Normocephalic and atraumatic,  Ext: warm, well perfused, intact peripheral pulses, positive edema  NEURO:  Mental Status: alert to hospital, not month abut got year correct, speech is  moderately dysarthric.  Naming, repetition, fluency, and comprehension intact. Cranial Nerves: PERRL 2 mm/brisk. EOMI, visual fields full, facial droop, left eye ptosis however this is somewhat residual from previous stroke, facial sensation intact, hearing intact, tongue/uvula/soft palate midline Motor: Right arm 5/5, left arm 4/5 bilateral legs shows difficulty however his right leg he is able to lift antigravity by an inch and dorsiflex and plantarflex his right ankle with 5/5, left leg has no movement Tone: is normal and bulk is normal Sensation- Intact to light touch bilaterally--per patient Coordination: FTN intact bilaterally-left arm has endpoint tremor likely secondary to weakness Gait- deferred  Labs I have reviewed labs in epic and the results pertinent to this consultation are:   CBC    Component Value Date/Time   WBC 7.9 03/23/2018 0317   RBC 4.48 03/23/2018 0317   HGB 11.6 (L) 03/23/2018 0317   HCT 36.4 (L) 03/23/2018 0317   PLT 185 03/23/2018 0317   MCV 81.3 03/23/2018 0317   MCH 25.9 (L) 03/23/2018 0317   MCHC 31.9 03/23/2018 0317   RDW 15.2 03/23/2018 0317   LYMPHSABS 1.9 03/23/2018 0317   MONOABS 1.1 (H) 03/23/2018 0317   EOSABS 0.2 03/23/2018 0317   BASOSABS 0.1 03/23/2018 0317    CMP     Component Value Date/Time   NA 139 03/23/2018 0317   K 3.8 03/23/2018 0317   CL 106 03/23/2018 0317   CO2 26 03/23/2018 0317   GLUCOSE 217 (H) 03/23/2018 0317   BUN 14 03/23/2018 0317   CREATININE 0.94 03/23/2018 0317   CALCIUM 9.7 03/23/2018 0317   PROT 6.6 03/23/2018 0317   ALBUMIN 3.5 03/23/2018 0317   AST 18 03/23/2018 0317   ALT 12 03/23/2018 0317   ALKPHOS 57 03/23/2018 0317   BILITOT 0.7 03/23/2018 0317   GFRNONAA >60 03/23/2018 0317   GFRAA >60 03/23/2018 0317    Lipid Panel  No results found for: CHOL, TRIG, HDL, CHOLHDL, VLDL, LDLCALC, LDLDIRECT   Imaging I have reviewed the images obtained:  CT-scan of the brain--no acute intracranial  abnormality  MRI examination of the brain--no intracranial acute process, moderate to severe chronic small vessel ischemia.  Old right basal ganglia lacunar infarct  MRA examination of the brain- intermittent loss of basilar artery signal concerning for stenosis.  May be overestimated by motion artifact.  Assessment: 84 year old male presenting with increased dysarthria still present but improving.  At this point cannot rule out MRI negative stroke versus TIA as it has not been 24 hours.  Recommendations: #Transthoracic  Echo,  # CTA head and neck #Continue Eliquis #Start or continue Atorvastatin 80 mg/other high intensity statin # BP goal: permissive HTN upto 220/120 mmHg # HBAIC and Lipid profile # Telemetry monitoring # Frequent neuro checks # NPO until passes stroke swallow screen # please page stroke NP  Or  PA  Or MD from 8am -4 pm  as this patient from this time will be  followed by the stroke.   You can look them up on [email or web address]  Password [email or web address]  I have seen and examined the patient. I have discussed the assessment and recommendations with the Neurology team.  Electronically signed: Dr. Caryl Pina

## 2018-03-23 NOTE — Progress Notes (Signed)
Pt admitted to the unit from ED. Pt arrived to the unit via stretcher with family and belongings to the side. Pt A&O x4; pt and family oriented to the unit and room; fall/safety precaution and prevention education completed. Telemetry applied and verified with CCMD and second RN called to second verify. Pt skin intact with no pressure ulcers noted except LLE has diabetic sores to toes and BLE edema with LLE slightly cyanotic. Pt c/o pain when touched or moved. Sacrum has healed old pressure ulcer scar. Condom cath intact. Skin assessment completed with second RN Darius. Pt in bed with call light within reach and family at bedside. Leotis Pain. Amo Lashica Hannay RN   03/23/18 1300  Vitals  Temp 98 F (36.7 C)  Temp Source Oral  BP (!) 171/71  MAP (mmHg) 99  BP Location Left Arm  BP Method Automatic  Patient Position (if appropriate) Lying  Pulse Rate 71  Pulse Rate Source Dinamap  Cardiac Rhythm Heart block  Heart Block Type 1st degree AVB  Resp 20  Oxygen Therapy  SpO2 100 %  O2 Device Room Air  Pain Assessment  Pain Scale 0-10  Pain Score 0  Patients Stated Pain Goal 0  PCA/Epidural/Spinal Assessment  Respiratory Pattern Regular;Unlabored

## 2018-03-24 ENCOUNTER — Inpatient Hospital Stay (HOSPITAL_COMMUNITY): Payer: Medicare HMO

## 2018-03-24 DIAGNOSIS — G8194 Hemiplegia, unspecified affecting left nondominant side: Secondary | ICD-10-CM | POA: Diagnosis not present

## 2018-03-24 DIAGNOSIS — D509 Iron deficiency anemia, unspecified: Secondary | ICD-10-CM | POA: Diagnosis present

## 2018-03-24 DIAGNOSIS — R400 Somnolence: Secondary | ICD-10-CM

## 2018-03-24 DIAGNOSIS — Z8673 Personal history of transient ischemic attack (TIA), and cerebral infarction without residual deficits: Secondary | ICD-10-CM | POA: Diagnosis not present

## 2018-03-24 DIAGNOSIS — G9340 Encephalopathy, unspecified: Secondary | ICD-10-CM | POA: Diagnosis not present

## 2018-03-24 DIAGNOSIS — E669 Obesity, unspecified: Secondary | ICD-10-CM | POA: Diagnosis present

## 2018-03-24 DIAGNOSIS — B309 Viral conjunctivitis, unspecified: Secondary | ICD-10-CM | POA: Diagnosis present

## 2018-03-24 DIAGNOSIS — Z79899 Other long term (current) drug therapy: Secondary | ICD-10-CM | POA: Diagnosis not present

## 2018-03-24 DIAGNOSIS — E785 Hyperlipidemia, unspecified: Secondary | ICD-10-CM | POA: Diagnosis present

## 2018-03-24 DIAGNOSIS — K219 Gastro-esophageal reflux disease without esophagitis: Secondary | ICD-10-CM | POA: Diagnosis present

## 2018-03-24 DIAGNOSIS — E1165 Type 2 diabetes mellitus with hyperglycemia: Secondary | ICD-10-CM | POA: Diagnosis not present

## 2018-03-24 DIAGNOSIS — R29810 Facial weakness: Secondary | ICD-10-CM | POA: Diagnosis present

## 2018-03-24 DIAGNOSIS — I10 Essential (primary) hypertension: Secondary | ICD-10-CM | POA: Diagnosis present

## 2018-03-24 DIAGNOSIS — Z7901 Long term (current) use of anticoagulants: Secondary | ICD-10-CM | POA: Diagnosis not present

## 2018-03-24 DIAGNOSIS — Z6837 Body mass index (BMI) 37.0-37.9, adult: Secondary | ICD-10-CM | POA: Diagnosis not present

## 2018-03-24 DIAGNOSIS — Z993 Dependence on wheelchair: Secondary | ICD-10-CM | POA: Diagnosis not present

## 2018-03-24 DIAGNOSIS — I69354 Hemiplegia and hemiparesis following cerebral infarction affecting left non-dominant side: Secondary | ICD-10-CM | POA: Diagnosis not present

## 2018-03-24 DIAGNOSIS — R29711 NIHSS score 11: Secondary | ICD-10-CM | POA: Diagnosis present

## 2018-03-24 DIAGNOSIS — Z7401 Bed confinement status: Secondary | ICD-10-CM | POA: Diagnosis not present

## 2018-03-24 DIAGNOSIS — Z7984 Long term (current) use of oral hypoglycemic drugs: Secondary | ICD-10-CM | POA: Diagnosis not present

## 2018-03-24 DIAGNOSIS — Z86718 Personal history of other venous thrombosis and embolism: Secondary | ICD-10-CM | POA: Diagnosis not present

## 2018-03-24 DIAGNOSIS — G459 Transient cerebral ischemic attack, unspecified: Secondary | ICD-10-CM | POA: Diagnosis present

## 2018-03-24 DIAGNOSIS — H409 Unspecified glaucoma: Secondary | ICD-10-CM | POA: Diagnosis present

## 2018-03-24 DIAGNOSIS — E1149 Type 2 diabetes mellitus with other diabetic neurological complication: Secondary | ICD-10-CM | POA: Diagnosis not present

## 2018-03-24 DIAGNOSIS — E1151 Type 2 diabetes mellitus with diabetic peripheral angiopathy without gangrene: Secondary | ICD-10-CM | POA: Diagnosis present

## 2018-03-24 LAB — GLUCOSE, CAPILLARY
GLUCOSE-CAPILLARY: 265 mg/dL — AB (ref 70–99)
Glucose-Capillary: 176 mg/dL — ABNORMAL HIGH (ref 70–99)
Glucose-Capillary: 205 mg/dL — ABNORMAL HIGH (ref 70–99)
Glucose-Capillary: 232 mg/dL — ABNORMAL HIGH (ref 70–99)

## 2018-03-24 LAB — LIPID PANEL
CHOLESTEROL: 246 mg/dL — AB (ref 0–200)
HDL: 53 mg/dL (ref >40)
LDL CALC: 171 mg/dL — AB (ref 0–99)
TRIGLYCERIDES: 108 mg/dL (ref <150)
Total CHOL/HDL Ratio: 4.6 RATIO
VLDL: 22 mg/dL (ref 0–40)

## 2018-03-24 LAB — HEMOGLOBIN A1C
HEMOGLOBIN A1C: 9 % — AB (ref 4.8–5.6)
MEAN PLASMA GLUCOSE: 211.6 mg/dL

## 2018-03-24 MED ORDER — BISACODYL 10 MG RE SUPP
10.0000 mg | Freq: Once | RECTAL | Status: AC
  Administered 2018-03-24: 10 mg via RECTAL
  Filled 2018-03-24: qty 1

## 2018-03-24 MED ORDER — HYPROMELLOSE (GONIOSCOPIC) 2.5 % OP SOLN
1.0000 [drp] | OPHTHALMIC | Status: DC | PRN
  Administered 2018-03-24 (×2): 1 [drp] via OPHTHALMIC
  Filled 2018-03-24: qty 15

## 2018-03-24 NOTE — Progress Notes (Addendum)
STROKE TEAM PROGRESS NOTE   INTERVAL HISTORY His daughter Sherry Ruffing is at the bedside.  Pt lives with his granddtr and she and her dtr care for him. She reports he has a good memory - but is basically w/c bound since last stroke in 2014.  this am he is very sleepy. dtr reports he slep well last night but not the night prior. dtr concerned about missing BM.   Vitals:   03/24/18 0007 03/24/18 0444 03/24/18 0719 03/24/18 0908  BP: (!) 180/74 (!) 164/70 (!) 139/58 139/65  Pulse: (!) 52 60 62 70  Resp: 18 18 18 20   Temp: 98.2 F (36.8 C)  98.4 F (36.9 C) 98.1 F (36.7 C)  TempSrc: Oral  Axillary Oral  SpO2: 96% 94% 98% 99%  Weight:      Height:        CBC:  Recent Labs  Lab 03/23/18 0317  WBC 7.9  NEUTROABS 4.6  HGB 11.6*  HCT 36.4*  MCV 81.3  PLT 185    Basic Metabolic Panel:  Recent Labs  Lab 03/23/18 0317  NA 139  K 3.8  CL 106  CO2 26  GLUCOSE 217*  BUN 14  CREATININE 0.94  CALCIUM 9.7   Lipid Panel:     Component Value Date/Time   CHOL 246 (H) 03/24/2018 0333   TRIG 108 03/24/2018 0333   HDL 53 03/24/2018 0333   CHOLHDL 4.6 03/24/2018 0333   VLDL 22 03/24/2018 0333   LDLCALC 171 (H) 03/24/2018 0333   HgbA1c:  Lab Results  Component Value Date   HGBA1C 9.0 (H) 03/24/2018   Urine Drug Screen:     Component Value Date/Time   LABOPIA NONE DETECTED 03/23/2018 0245   COCAINSCRNUR NONE DETECTED 03/23/2018 0245   LABBENZ NONE DETECTED 03/23/2018 0245   AMPHETMU NONE DETECTED 03/23/2018 0245   THCU NONE DETECTED 03/23/2018 0245   LABBARB NONE DETECTED 03/23/2018 0245    Alcohol Level     Component Value Date/Time   ETH <10 03/23/2018 0317    IMAGING Ct Angio Head W Or Wo Contrast  Result Date: 03/23/2018 CLINICAL DATA:  Slurred speech. Hypertension diabetes. Left facial droop. Left-sided weakness. EXAM: CT ANGIOGRAPHY HEAD AND NECK TECHNIQUE: Multidetector CT imaging of the head and neck was performed using the standard protocol during bolus  administration of intravenous contrast. Multiplanar CT image reconstructions and MIPs were obtained to evaluate the vascular anatomy. Carotid stenosis measurements (when applicable) are obtained utilizing NASCET criteria, using the distal internal carotid diameter as the denominator. CONTRAST:  50mL ISOVUE-370 IOPAMIDOL (ISOVUE-370) INJECTION 76% COMPARISON:  MRI head 03/23/2018, CT head 03/23/2018 FINDINGS: CTA NECK FINDINGS Aortic arch: Mild atherosclerotic disease in the aortic arch without aneurysm or dissection. Four vessel aortic arch. Left vertebral artery origin from the arch. Right carotid system: Right carotid system widely patent without stenosis Left carotid system: Mild atherosclerotic disease left carotid bifurcation without significant stenosis. Vertebral arteries: Moderate stenosis distal right vertebral artery which does supply the basilar. Basilar is very small and severely diseased. Left vertebral artery origin from the arch is hypoplastic. Occlusion of the distal left vertebral artery after PICA Skeleton: Negative Other neck: Negative Upper chest: Linear soft tissue density with 5 mm calcification right upper lobe most compatible with scarring. Review of the MIP images confirms the above findings CTA HEAD FINDINGS Anterior circulation: Atherosclerotic disease in the cavernous carotid bilaterally without significant stenosis. Anterior and middle cerebral arteries patent bilaterally. Mild disease in the left middle cerebral  artery. Mild to moderate stenosis distal right M1 segment. Posterior circulation: Fetal origin of the posterior cerebral artery bilaterally with hypoplastic posterior circulation. Distal left vertebral artery does not contribute to basilar which could be congenital or due to atherosclerotic disease. Moderate stenosis distal right vertebral artery. Basilar is very small and diffusely diseased with segmental occlusion in the midportion. PICA patent bilaterally. Superior cerebellar  arteries patent bilaterally. Moderately severe tandem stenoses in the left posterior cerebral artery. Mild stenosis right posterior cerebral artery. Venous sinuses: Patent Anatomic variants: None Delayed phase: Atrophy and chronic microvascular ischemic changes in the white matter. Normal enhancement. Review of the MIP images confirms the above findings IMPRESSION: Bilateral carotid and vertebral arteries are widely patent in the neck without significant stenosis. Mild atherosclerotic disease cavernous carotid artery and middle cerebral arteries bilaterally Severe disease in the posterior circulation. Fetal origin of the posterior cerebral arteries bilaterally. 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 This is of unknown duration. No acute infarct in the brainstem on MRI earlier today. Moderate stenosis left posterior cerebral artery and mild stenosis right posterior cerebral artery. These results were called by telephone at the time of interpretation on 03/23/2018 at 10:59 am to Dr. Otelia Limes, who verbally acknowledged these results. Electronically Signed   By: Marlan Palau M.D.   On: 03/23/2018 10:59   Dg Chest 2 View  Result Date: 03/23/2018 CLINICAL DATA:  TIA. EXAM: CHEST - 2 VIEW COMPARISON:  Radiograph 08/29/2016 FINDINGS: Unchanged heart size and mediastinal contours with borderline cardiomegaly. Aortic atherosclerosis. No pulmonary edema, focal consolidation, pleural effusion or pneumothorax. Remote right clavicle fracture. IMPRESSION: 1. No acute findings. 2.  Aortic Atherosclerosis (ICD10-I70.0). Electronically Signed   By: Rubye Oaks M.D.   On: 03/23/2018 03:28   Ct Head Wo Contrast  Result Date: 03/23/2018 CLINICAL DATA:  New onset LEFT eye droop. Chronic LEFT-sided weakness. History of stroke, hypertension, diabetes. EXAM: CT HEAD WITHOUT CONTRAST TECHNIQUE: Contiguous axial images were obtained from the base of the skull through the vertex  without intravenous contrast. COMPARISON:  CT HEAD Dec 13, 2014 FINDINGS: BRAIN: No intraparenchymal hemorrhage, mass effect nor midline shift. The ventricles and sulci are normal for age. Patchy supratentorial white matter hypodensities within normal range for patient's age, though non-specific are most compatible with chronic small vessel ischemic disease. No acute large vascular territory infarcts. No abnormal extra-axial fluid collections. Basal cisterns are patent. VASCULAR: Mild calcific atherosclerosis of the carotid siphons. SKULL: No skull fracture. Multiple dental caries. No significant scalp soft tissue swelling. SINUSES/ORBITS: Trace paranasal sinus mucosal thickening. Mastoid air cells are well aerated.The included ocular globes and orbital contents are non-suspicious. Status post RIGHT ocular lens implant. OTHER: LEFT preseptal swelling with soft tissue density. No subcutaneous gas or radiopaque foreign bodies. IMPRESSION: 1. LEFT preseptal soft tissue swelling, potential cellulitis or mass. 2. Negative non-contrast CT HEAD for age. Electronically Signed   By: Awilda Metro M.D.   On: 03/23/2018 03:23   Ct Angio Neck W Or Wo Contrast  Result Date: 03/23/2018 CLINICAL DATA:  Slurred speech. Hypertension diabetes. Left facial droop. Left-sided weakness. EXAM: CT ANGIOGRAPHY HEAD AND NECK TECHNIQUE: Multidetector CT imaging of the head and neck was performed using the standard protocol during bolus administration of intravenous contrast. Multiplanar CT image reconstructions and MIPs were obtained to evaluate the vascular anatomy. Carotid stenosis measurements (when applicable) are obtained utilizing NASCET criteria, using the distal internal carotid diameter as the denominator. CONTRAST:  50mL ISOVUE-370 IOPAMIDOL (  ISOVUE-370) INJECTION 76% COMPARISON:  MRI head 03/23/2018, CT head 03/23/2018 FINDINGS: CTA NECK FINDINGS Aortic arch: Mild atherosclerotic disease in the aortic arch without aneurysm  or dissection. Four vessel aortic arch. Left vertebral artery origin from the arch. Right carotid system: Right carotid system widely patent without stenosis Left carotid system: Mild atherosclerotic disease left carotid bifurcation without significant stenosis. Vertebral arteries: Moderate stenosis distal right vertebral artery which does supply the basilar. Basilar is very small and severely diseased. Left vertebral artery origin from the arch is hypoplastic. Occlusion of the distal left vertebral artery after PICA Skeleton: Negative Other neck: Negative Upper chest: Linear soft tissue density with 5 mm calcification right upper lobe most compatible with scarring. Review of the MIP images confirms the above findings CTA HEAD FINDINGS Anterior circulation: Atherosclerotic disease in the cavernous carotid bilaterally without significant stenosis. Anterior and middle cerebral arteries patent bilaterally. Mild disease in the left middle cerebral artery. Mild to moderate stenosis distal right M1 segment. Posterior circulation: Fetal origin of the posterior cerebral artery bilaterally with hypoplastic posterior circulation. Distal left vertebral artery does not contribute to basilar which could be congenital or due to atherosclerotic disease. Moderate stenosis distal right vertebral artery. Basilar is very small and diffusely diseased with segmental occlusion in the midportion. PICA patent bilaterally. Superior cerebellar arteries patent bilaterally. Moderately severe tandem stenoses in the left posterior cerebral artery. Mild stenosis right posterior cerebral artery. Venous sinuses: Patent Anatomic variants: None Delayed phase: Atrophy and chronic microvascular ischemic changes in the white matter. Normal enhancement. Review of the MIP images confirms the above findings IMPRESSION: Bilateral carotid and vertebral arteries are widely patent in the neck without significant stenosis. Mild atherosclerotic disease cavernous  carotid artery and middle cerebral arteries bilaterally Severe disease in the posterior circulation. Fetal origin of the posterior cerebral arteries bilaterally. 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 This is of unknown duration. No acute infarct in the brainstem on MRI earlier today. Moderate stenosis left posterior cerebral artery and mild stenosis right posterior cerebral artery. These results were called by telephone at the time of interpretation on 03/23/2018 at 10:59 am to Dr. Otelia Limes, who verbally acknowledged these results. Electronically Signed   By: Marlan Palau M.D.   On: 03/23/2018 10:59   Mr Maxine Glenn Head Wo Contrast  Result Date: 03/23/2018 CLINICAL DATA:  LEFT eye droop, garbled speech for 30 minutes. Now at baseline. History of stroke, hypertension, diabetes, anticoagulation. EXAM: MRI HEAD WITHOUT CONTRAST MRA HEAD WITHOUT CONTRAST TECHNIQUE: Multiplanar, multiecho pulse sequences of the brain and surrounding structures were obtained without intravenous contrast. Angiographic images of the head were obtained using MRA technique without contrast. COMPARISON:  CT HEAD March 23, 2018. MRI head report dated June 15, 2013 though images are not available for direct comparison. FINDINGS: MRI HEAD FINDINGS-moderately motion degraded examination. INTRACRANIAL CONTENTS: No reduced diffusion to suggest acute ischemia. No susceptibility artifact to suggest hemorrhage. The ventricles and sulci are normal for patient's age. Patchy to confluent pontine and supratentorial white matter FLAIR T2 hyperintensities tracking into RIGHT internal capsule. Old RIGHT basal ganglia lacunar infarct. No suspicious parenchymal signal, masses, mass effect. No abnormal extra-axial fluid collections. No extra-axial masses. VASCULAR: Normal major intracranial vascular flow voids present at skull base. SKULL AND UPPER CERVICAL SPINE: No abnormal sellar expansion. No suspicious  calvarial bone marrow signal. Craniocervical junction maintained. SINUSES/ORBITS: Trace paranasal sinus mucosal thickening. LEFT maxillary mucosal retention cyst. Trace LEFT mastoid effusion. The included ocular globes  and orbital contents are non-suspicious. Status post RIGHT ocular lens implant. OTHER: None. MRA HEAD FINDINGS ANTERIOR CIRCULATION: Normal flow related enhancement of the included cervical, petrous, cavernous and supraclinoid internal carotid arteries. Mild luminal irregularity RIGHT supraclinoid internal carotid artery. Patent anterior communicating artery. Patent anterior and middle cerebral arteries. POSTERIOR CIRCULATION: Due to motion, vertebral arteries not well characterized. Tandem loss of basilar artery flow related enhancement. Robust bilateral posterior communicating arteries present. Patent posterior cerebral arteries, moderate stenosis seen with atherosclerosis or motion artifact. ANATOMIC VARIANTS: Aplastic RIGHT A1 segment. Source images and MIP images were reviewed. IMPRESSION: MRI HEAD: 1. No acute intracranial process on this moderately motion degraded examination. 2. Moderate to severe chronic small vessel ischemic changes. Old RIGHT basal ganglia lacunar infarct. MRA HEAD: 1. Moderately motion degraded examination. No emergent large vessel occlusion. 2. Intermittent loss of basilar artery signal concerning for stenosis though, may be overestimated by motion artifact. Findings would be better demonstrated on CTA HEAD as clinically indicated. Electronically Signed   By: Awilda Metro M.D.   On: 03/23/2018 06:11   Mr Brain Wo Contrast  Result Date: 03/23/2018 CLINICAL DATA:  LEFT eye droop, garbled speech for 30 minutes. Now at baseline. History of stroke, hypertension, diabetes, anticoagulation. EXAM: MRI HEAD WITHOUT CONTRAST MRA HEAD WITHOUT CONTRAST TECHNIQUE: Multiplanar, multiecho pulse sequences of the brain and surrounding structures were obtained without intravenous  contrast. Angiographic images of the head were obtained using MRA technique without contrast. COMPARISON:  CT HEAD March 23, 2018. MRI head report dated June 15, 2013 though images are not available for direct comparison. FINDINGS: MRI HEAD FINDINGS-moderately motion degraded examination. INTRACRANIAL CONTENTS: No reduced diffusion to suggest acute ischemia. No susceptibility artifact to suggest hemorrhage. The ventricles and sulci are normal for patient's age. Patchy to confluent pontine and supratentorial white matter FLAIR T2 hyperintensities tracking into RIGHT internal capsule. Old RIGHT basal ganglia lacunar infarct. No suspicious parenchymal signal, masses, mass effect. No abnormal extra-axial fluid collections. No extra-axial masses. VASCULAR: Normal major intracranial vascular flow voids present at skull base. SKULL AND UPPER CERVICAL SPINE: No abnormal sellar expansion. No suspicious calvarial bone marrow signal. Craniocervical junction maintained. SINUSES/ORBITS: Trace paranasal sinus mucosal thickening. LEFT maxillary mucosal retention cyst. Trace LEFT mastoid effusion. The included ocular globes and orbital contents are non-suspicious. Status post RIGHT ocular lens implant. OTHER: None. MRA HEAD FINDINGS ANTERIOR CIRCULATION: Normal flow related enhancement of the included cervical, petrous, cavernous and supraclinoid internal carotid arteries. Mild luminal irregularity RIGHT supraclinoid internal carotid artery. Patent anterior communicating artery. Patent anterior and middle cerebral arteries. POSTERIOR CIRCULATION: Due to motion, vertebral arteries not well characterized. Tandem loss of basilar artery flow related enhancement. Robust bilateral posterior communicating arteries present. Patent posterior cerebral arteries, moderate stenosis seen with atherosclerosis or motion artifact. ANATOMIC VARIANTS: Aplastic RIGHT A1 segment. Source images and MIP images were reviewed. IMPRESSION: MRI HEAD: 1.  No acute intracranial process on this moderately motion degraded examination. 2. Moderate to severe chronic small vessel ischemic changes. Old RIGHT basal ganglia lacunar infarct. MRA HEAD: 1. Moderately motion degraded examination. No emergent large vessel occlusion. 2. Intermittent loss of basilar artery signal concerning for stenosis though, may be overestimated by motion artifact. Findings would be better demonstrated on CTA HEAD as clinically indicated. Electronically Signed   By: Awilda Metro M.D.   On: 03/23/2018 06:11   Carotid Doppler   There is 1-39% bilateral ICA stenosis. Vertebral artery flow is antegrade.   2D Echocardiogram  - Left ventricle: The cavity  size was normal. Wall thickness was increased in a pattern of moderate LVH. There was mild focal basal hypertrophy of the septum. Systolic function was normal. The estimated ejection fraction was in the range of 55% to 60%. Doppler parameters are consistent with abnormal left ventricular relaxation (grade 1 diastolic dysfunction). - Mitral valve: Mildly calcified annulus. - Left atrium: The atrium was mildly dilated. Impressions:  - No cardiac source of embolism was identified, but cannot be ruled out on the basis of this examination.   PHYSICAL EXAM Per Dr. Pearlean BrownieSethi  ASSESSMENT/PLAN Mr. Tom GessLawrence Wang is a 84 y.o. male with history of DVT on Eliquis, stroke affecting his left side in 2014 with resultant left facial droop, left arm weakness, bilateral weakness requiring wheelchair bound status, HTN, diabetes presenting with increased slurred speech.   TIA on Eliquis in setting of poor risk factor control  CT head left preseptal soft tissue swelling potential cellulitis or mass.  Otherwise negative  MRI no acute stroke.  Moderate to severe small vessel disease.  Old right basal ganglia lacune.  MRA no LVO.  Loss of basilar artery signal concerning for stenosis though likely motion artifact  CTA head & neck mild  atherosclerosis.  Severe posterior circulation disease.  No acute infarct seen on MRI.Marland Kitchen.  Carotid Doppler no ICA stenosis.  Vertebral artery antegrade  2D Echo EF 55 to 6%.  No SOE  LDL 171  HgbA1c 9.0  Eliquis for VTE prophylaxis  Eliquis (apixaban) daily prior to admission, now on Eliquis (apixaban) daily.  Continue Eliquis at discharge  Therapy recommendations: Home health PT.  Supervision.  Home health aide. (24-hour care provided by daughter and granddaughter)  Disposition:  Return home planned  DVT left lower extremity  On Eliquis 2.5 mg twice daily   Continue Eliquis at discharge  Hypertension  Stable . BP goal normotensive  Hyperlipidemia  Home meds: Zocor 20, resumed in hospital  LDL 171, goal < 70  Continue statin at discharge  Diabetes type II  HgbA1c 9.0, goal < 7.0  Uncontrolled  Other Stroke Risk Factors  Advanced age  Obesity, Body mass index is 37.77 kg/m., recommend weight loss, diet and exercise as appropriate   Hx stroke/TIA  2014 -right basal ganglia infarct with resultant left hemiparesis.   Other Active Problems  Left preseptal soft tissue swelling.  Iron deficiency anemia  Hospital day # 0  Annie MainSharon Biby, MSN, APRN, ANVP-BC, AGPCNP-BC Advanced Practice Stroke Nurse [facility] See Amion for Schedule & Pager information 03/24/2018 3:38 PM   I have personally examined this patient, reviewed notes, independently viewed imaging studies, participated in medical decision making and plan of care.ROS completed by me personally and pertinent positives fully documented  I have made any additions or clarifications directly to the above note. Agree with note above.    Delia HeadyPramod Amandajo Gonder, MD Medical Director [facility] Pager: [phone] 03/27/2018 11:02 PM   To contact Stroke Continuity provider, please refer to [email or web address]. After hours, contact General Neurology

## 2018-03-24 NOTE — Progress Notes (Signed)
OT Cancellation Note  Patient Details Name: Tom Wang MRN: [id number] DOB: 1924-09-09   Cancelled Treatment:    Reason Eval/Treat Not Completed: Fatigue/lethargy limiting ability to participate. Pt will awaken to name being call or sternal rub, however unable to keep eyes open for participation in evaluation. Daughter is bedside and said that multiple team members have been in and she can't even wake him with tickling, wet wash cloths, or food (his favorite). OT will attempt back later as schedule allows. RN and MD aware of lethargy.  Evern BioLaura J Nivea Wojdyla 03/24/2018, 11:29 AM  Sherryl MangesLaura Latayvia Mandujano OTR/L [phone]

## 2018-03-24 NOTE — Progress Notes (Signed)
STROKE TEAM PROGRESS NOTE  HPI:( Dr Otelia Limes )  Tom Wang is a 84 y.o. 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  At baseline patient has a left facial droop, weakness of his left arm, bilateral weakness to the point that he is bedbound and needs transfer.  Patient was noted to be last known normal at 00 15 this a.m.  At about 1230 daughter noted that he was dysarthric more than normal.  She was worried that he may be having a CVA.  Thus patient was brought to the hospital.  Patient has not missed any anticoagulants.  She does state that she feels his dysarthria is slightly improving at this time but still present. LKW: 00 15 03/23/2018 tpa given?: no, on Eliquis and out of the window Premorbid modified Rankin scale (mRS): 5 NIH stroke score of 11  INTERVAL HISTORY His daughter Tom Wang is at the bedside.  Pt lives with his granddtr and she and her dtr care for him. She reports he has a good memory - but is basically w/c bound since last stroke in 2014.  this am he is very sleepy. dtr reports he slep well last night but not the night prior. dtr concerned about missing BM.   Vitals:   03/24/18 0719 03/24/18 0908 03/24/18 1148 03/24/18 1521  BP: (!) 139/58 139/65 (!) 172/81 (!) 167/67  Pulse: 62 70 74 (!) 56  Resp: 18 20 18 19   Temp: 98.4 F (36.9 C) 98.1 F (36.7 C) 97.9 F (36.6 C) 97.7 F (36.5 C)  TempSrc: Axillary Oral Oral Oral  SpO2: 98% 99% 98% 95%  Weight:      Height:        CBC:  Recent Labs  Lab 03/23/18 0317  WBC 7.9  NEUTROABS 4.6  HGB 11.6*  HCT 36.4*  MCV 81.3  PLT 185    Basic Metabolic Panel:  Recent Labs  Lab 03/23/18 0317  NA 139  K 3.8  CL 106  CO2 26  GLUCOSE 217*  BUN 14  CREATININE 0.94  CALCIUM 9.7   Lipid Panel:     Component Value Date/Time   CHOL 246 (H) 03/24/2018 0333   TRIG 108 03/24/2018 0333   HDL 53 03/24/2018 0333   CHOLHDL 4.6  03/24/2018 0333   VLDL 22 03/24/2018 0333   LDLCALC 171 (H) 03/24/2018 0333   HgbA1c:  Lab Results  Component Value Date   HGBA1C 9.0 (H) 03/24/2018   Urine Drug Screen:     Component Value Date/Time   LABOPIA NONE DETECTED 03/23/2018 0245   COCAINSCRNUR NONE DETECTED 03/23/2018 0245   LABBENZ NONE DETECTED 03/23/2018 0245   AMPHETMU NONE DETECTED 03/23/2018 0245   THCU NONE DETECTED 03/23/2018 0245   LABBARB NONE DETECTED 03/23/2018 0245    Alcohol Level     Component Value Date/Time   ETH <10 03/23/2018 0317    IMAGING Ct Angio Head W Or Wo Contrast  Result Date: 03/23/2018 CLINICAL DATA:  Slurred speech. Hypertension diabetes. Left facial droop. Left-sided weakness. EXAM: CT ANGIOGRAPHY HEAD AND NECK TECHNIQUE: Multidetector CT imaging of the head and neck was performed using the standard protocol during bolus administration of intravenous contrast. Multiplanar CT image reconstructions and MIPs were obtained to evaluate the vascular anatomy. Carotid stenosis measurements (when applicable) are obtained utilizing NASCET criteria, using the distal internal carotid diameter as the denominator. CONTRAST:  50mL ISOVUE-370 IOPAMIDOL (ISOVUE-370) INJECTION  76% COMPARISON:  MRI head 03/23/2018, CT head 03/23/2018 FINDINGS: CTA NECK FINDINGS Aortic arch: Mild atherosclerotic disease in the aortic arch without aneurysm or dissection. Four vessel aortic arch. Left vertebral artery origin from the arch. Right carotid system: Right carotid system widely patent without stenosis Left carotid system: Mild atherosclerotic disease left carotid bifurcation without significant stenosis. Vertebral arteries: Moderate stenosis distal right vertebral artery which does supply the basilar. Basilar is very small and severely diseased. Left vertebral artery origin from the arch is hypoplastic. Occlusion of the distal left vertebral artery after PICA Skeleton: Negative Other neck: Negative Upper chest: Linear soft  tissue density with 5 mm calcification right upper lobe most compatible with scarring. Review of the MIP images confirms the above findings CTA HEAD FINDINGS Anterior circulation: Atherosclerotic disease in the cavernous carotid bilaterally without significant stenosis. Anterior and middle cerebral arteries patent bilaterally. Mild disease in the left middle cerebral artery. Mild to moderate stenosis distal right M1 segment. Posterior circulation: Fetal origin of the posterior cerebral artery bilaterally with hypoplastic posterior circulation. Distal left vertebral artery does not contribute to basilar which could be congenital or due to atherosclerotic disease. Moderate stenosis distal right vertebral artery. Basilar is very small and diffusely diseased with segmental occlusion in the midportion. PICA patent bilaterally. Superior cerebellar arteries patent bilaterally. Moderately severe tandem stenoses in the left posterior cerebral artery. Mild stenosis right posterior cerebral artery. Venous sinuses: Patent Anatomic variants: None Delayed phase: Atrophy and chronic microvascular ischemic changes in the white matter. Normal enhancement. Review of the MIP images confirms the above findings IMPRESSION: Bilateral carotid and vertebral arteries are widely patent in the neck without significant stenosis. Mild atherosclerotic disease cavernous carotid artery and middle cerebral arteries bilaterally Severe disease in the posterior circulation. Fetal origin of the posterior cerebral arteries bilaterally. 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 This is of unknown duration. No acute infarct in the brainstem on MRI earlier today. Moderate stenosis left posterior cerebral artery and mild stenosis right posterior cerebral artery. These results were called by telephone at the time of interpretation on 03/23/2018 at 10:59 am to Dr. Otelia Limes, who verbally acknowledged these  results. Electronically Signed   By: Marlan Palau M.D.   On: 03/23/2018 10:59   Dg Chest 2 View  Result Date: 03/23/2018 CLINICAL DATA:  TIA. EXAM: CHEST - 2 VIEW COMPARISON:  Radiograph 08/29/2016 FINDINGS: Unchanged heart size and mediastinal contours with borderline cardiomegaly. Aortic atherosclerosis. No pulmonary edema, focal consolidation, pleural effusion or pneumothorax. Remote right clavicle fracture. IMPRESSION: 1. No acute findings. 2.  Aortic Atherosclerosis (ICD10-I70.0). Electronically Signed   By: Rubye Oaks M.D.   On: 03/23/2018 03:28   Ct Head Wo Contrast  Result Date: 03/23/2018 CLINICAL DATA:  New onset LEFT eye droop. Chronic LEFT-sided weakness. History of stroke, hypertension, diabetes. EXAM: CT HEAD WITHOUT CONTRAST TECHNIQUE: Contiguous axial images were obtained from the base of the skull through the vertex without intravenous contrast. COMPARISON:  CT HEAD Dec 13, 2014 FINDINGS: BRAIN: No intraparenchymal hemorrhage, mass effect nor midline shift. The ventricles and sulci are normal for age. Patchy supratentorial white matter hypodensities within normal range for patient's age, though non-specific are most compatible with chronic small vessel ischemic disease. No acute large vascular territory infarcts. No abnormal extra-axial fluid collections. Basal cisterns are patent. VASCULAR: Mild calcific atherosclerosis of the carotid siphons. SKULL: No skull fracture. Multiple dental caries. No significant scalp soft tissue swelling. SINUSES/ORBITS: Trace paranasal sinus mucosal  thickening. Mastoid air cells are well aerated.The included ocular globes and orbital contents are non-suspicious. Status post RIGHT ocular lens implant. OTHER: LEFT preseptal swelling with soft tissue density. No subcutaneous gas or radiopaque foreign bodies. IMPRESSION: 1. LEFT preseptal soft tissue swelling, potential cellulitis or mass. 2. Negative non-contrast CT HEAD for age. Electronically Signed    By: Awilda Metro M.D.   On: 03/23/2018 03:23   Ct Angio Neck W Or Wo Contrast  Result Date: 03/23/2018 CLINICAL DATA:  Slurred speech. Hypertension diabetes. Left facial droop. Left-sided weakness. EXAM: CT ANGIOGRAPHY HEAD AND NECK TECHNIQUE: Multidetector CT imaging of the head and neck was performed using the standard protocol during bolus administration of intravenous contrast. Multiplanar CT image reconstructions and MIPs were obtained to evaluate the vascular anatomy. Carotid stenosis measurements (when applicable) are obtained utilizing NASCET criteria, using the distal internal carotid diameter as the denominator. CONTRAST:  50mL ISOVUE-370 IOPAMIDOL (ISOVUE-370) INJECTION 76% COMPARISON:  MRI head 03/23/2018, CT head 03/23/2018 FINDINGS: CTA NECK FINDINGS Aortic arch: Mild atherosclerotic disease in the aortic arch without aneurysm or dissection. Four vessel aortic arch. Left vertebral artery origin from the arch. Right carotid system: Right carotid system widely patent without stenosis Left carotid system: Mild atherosclerotic disease left carotid bifurcation without significant stenosis. Vertebral arteries: Moderate stenosis distal right vertebral artery which does supply the basilar. Basilar is very small and severely diseased. Left vertebral artery origin from the arch is hypoplastic. Occlusion of the distal left vertebral artery after PICA Skeleton: Negative Other neck: Negative Upper chest: Linear soft tissue density with 5 mm calcification right upper lobe most compatible with scarring. Review of the MIP images confirms the above findings CTA HEAD FINDINGS Anterior circulation: Atherosclerotic disease in the cavernous carotid bilaterally without significant stenosis. Anterior and middle cerebral arteries patent bilaterally. Mild disease in the left middle cerebral artery. Mild to moderate stenosis distal right M1 segment. Posterior circulation: Fetal origin of the posterior cerebral artery  bilaterally with hypoplastic posterior circulation. Distal left vertebral artery does not contribute to basilar which could be congenital or due to atherosclerotic disease. Moderate stenosis distal right vertebral artery. Basilar is very small and diffusely diseased with segmental occlusion in the midportion. PICA patent bilaterally. Superior cerebellar arteries patent bilaterally. Moderately severe tandem stenoses in the left posterior cerebral artery. Mild stenosis right posterior cerebral artery. Venous sinuses: Patent Anatomic variants: None Delayed phase: Atrophy and chronic microvascular ischemic changes in the white matter. Normal enhancement. Review of the MIP images confirms the above findings IMPRESSION: Bilateral carotid and vertebral arteries are widely patent in the neck without significant stenosis. Mild atherosclerotic disease cavernous carotid artery and middle cerebral arteries bilaterally Severe disease in the posterior circulation. Fetal origin of the posterior cerebral arteries bilaterally. 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 This is of unknown duration. No acute infarct in the brainstem on MRI earlier today. Moderate stenosis left posterior cerebral artery and mild stenosis right posterior cerebral artery. These results were called by telephone at the time of interpretation on 03/23/2018 at 10:59 am to Dr. Otelia Limes, who verbally acknowledged these results. Electronically Signed   By: Marlan Palau M.D.   On: 03/23/2018 10:59   Mr Maxine Glenn Head Wo Contrast  Result Date: 03/23/2018 CLINICAL DATA:  LEFT eye droop, garbled speech for 30 minutes. Now at baseline. History of stroke, hypertension, diabetes, anticoagulation. EXAM: MRI HEAD WITHOUT CONTRAST MRA HEAD WITHOUT CONTRAST TECHNIQUE: Multiplanar, multiecho pulse sequences of the brain and surrounding  structures were obtained without intravenous contrast. Angiographic images of the head were  obtained using MRA technique without contrast. COMPARISON:  CT HEAD March 23, 2018. MRI head report dated June 15, 2013 though images are not available for direct comparison. FINDINGS: MRI HEAD FINDINGS-moderately motion degraded examination. INTRACRANIAL CONTENTS: No reduced diffusion to suggest acute ischemia. No susceptibility artifact to suggest hemorrhage. The ventricles and sulci are normal for patient's age. Patchy to confluent pontine and supratentorial white matter FLAIR T2 hyperintensities tracking into RIGHT internal capsule. Old RIGHT basal ganglia lacunar infarct. No suspicious parenchymal signal, masses, mass effect. No abnormal extra-axial fluid collections. No extra-axial masses. VASCULAR: Normal major intracranial vascular flow voids present at skull base. SKULL AND UPPER CERVICAL SPINE: No abnormal sellar expansion. No suspicious calvarial bone marrow signal. Craniocervical junction maintained. SINUSES/ORBITS: Trace paranasal sinus mucosal thickening. LEFT maxillary mucosal retention cyst. Trace LEFT mastoid effusion. The included ocular globes and orbital contents are non-suspicious. Status post RIGHT ocular lens implant. OTHER: None. MRA HEAD FINDINGS ANTERIOR CIRCULATION: Normal flow related enhancement of the included cervical, petrous, cavernous and supraclinoid internal carotid arteries. Mild luminal irregularity RIGHT supraclinoid internal carotid artery. Patent anterior communicating artery. Patent anterior and middle cerebral arteries. POSTERIOR CIRCULATION: Due to motion, vertebral arteries not well characterized. Tandem loss of basilar artery flow related enhancement. Robust bilateral posterior communicating arteries present. Patent posterior cerebral arteries, moderate stenosis seen with atherosclerosis or motion artifact. ANATOMIC VARIANTS: Aplastic RIGHT A1 segment. Source images and MIP images were reviewed. IMPRESSION: MRI HEAD: 1. No acute intracranial process on this  moderately motion degraded examination. 2. Moderate to severe chronic small vessel ischemic changes. Old RIGHT basal ganglia lacunar infarct. MRA HEAD: 1. Moderately motion degraded examination. No emergent large vessel occlusion. 2. Intermittent loss of basilar artery signal concerning for stenosis though, may be overestimated by motion artifact. Findings would be better demonstrated on CTA HEAD as clinically indicated. Electronically Signed   By: Awilda Metroourtnay  Bloomer M.D.   On: 03/23/2018 06:11   Mr Brain Wo Contrast  Result Date: 03/23/2018 CLINICAL DATA:  LEFT eye droop, garbled speech for 30 minutes. Now at baseline. History of stroke, hypertension, diabetes, anticoagulation. EXAM: MRI HEAD WITHOUT CONTRAST MRA HEAD WITHOUT CONTRAST TECHNIQUE: Multiplanar, multiecho pulse sequences of the brain and surrounding structures were obtained without intravenous contrast. Angiographic images of the head were obtained using MRA technique without contrast. COMPARISON:  CT HEAD March 23, 2018. MRI head report dated June 15, 2013 though images are not available for direct comparison. FINDINGS: MRI HEAD FINDINGS-moderately motion degraded examination. INTRACRANIAL CONTENTS: No reduced diffusion to suggest acute ischemia. No susceptibility artifact to suggest hemorrhage. The ventricles and sulci are normal for patient's age. Patchy to confluent pontine and supratentorial white matter FLAIR T2 hyperintensities tracking into RIGHT internal capsule. Old RIGHT basal ganglia lacunar infarct. No suspicious parenchymal signal, masses, mass effect. No abnormal extra-axial fluid collections. No extra-axial masses. VASCULAR: Normal major intracranial vascular flow voids present at skull base. SKULL AND UPPER CERVICAL SPINE: No abnormal sellar expansion. No suspicious calvarial bone marrow signal. Craniocervical junction maintained. SINUSES/ORBITS: Trace paranasal sinus mucosal thickening. LEFT maxillary mucosal retention cyst.  Trace LEFT mastoid effusion. The included ocular globes and orbital contents are non-suspicious. Status post RIGHT ocular lens implant. OTHER: None. MRA HEAD FINDINGS ANTERIOR CIRCULATION: Normal flow related enhancement of the included cervical, petrous, cavernous and supraclinoid internal carotid arteries. Mild luminal irregularity RIGHT supraclinoid internal carotid artery. Patent anterior communicating artery. Patent anterior and middle cerebral arteries. POSTERIOR CIRCULATION:  Due to motion, vertebral arteries not well characterized. Tandem loss of basilar artery flow related enhancement. Robust bilateral posterior communicating arteries present. Patent posterior cerebral arteries, moderate stenosis seen with atherosclerosis or motion artifact. ANATOMIC VARIANTS: Aplastic RIGHT A1 segment. Source images and MIP images were reviewed. IMPRESSION: MRI HEAD: 1. No acute intracranial process on this moderately motion degraded examination. 2. Moderate to severe chronic small vessel ischemic changes. Old RIGHT basal ganglia lacunar infarct. MRA HEAD: 1. Moderately motion degraded examination. No emergent large vessel occlusion. 2. Intermittent loss of basilar artery signal concerning for stenosis though, may be overestimated by motion artifact. Findings would be better demonstrated on CTA HEAD as clinically indicated. Electronically Signed   By: Awilda Metro M.D.   On: 03/23/2018 06:11   Carotid Doppler   There is 1-39% bilateral ICA stenosis. Vertebral artery flow is antegrade.   2D Echocardiogram  - Left ventricle: The cavity size was normal. Wall thickness was increased in a pattern of moderate LVH. There was mild focal basal hypertrophy of the septum. Systolic function was normal. The estimated ejection fraction was in the range of 55% to 60%. Doppler parameters are consistent with abnormal left ventricular relaxation (grade 1 diastolic dysfunction). - Mitral valve: Mildly calcified annulus. - Left  atrium: The atrium was mildly dilated. Impressions:  - No cardiac source of embolism was identified, but cannot be ruled out on the basis of this examination.   PHYSICAL EXAM Frail elderly   African-American male not in distress. . Afebrile. Head is nontraumatic. Neck is supple without bruit.    Cardiac exam no murmur or gallop. Lungs are clear to auscultation. Distal pulses are well felt. Neurological Exam :  Drowsy but can be aroused. Sleepy. Follows simple commands. Mild dysarthria but can be understood. Extraocular movements appear full range. Blinks to threat bilaterally. Fundi not visualized. Mild right lower facial weakness. Tongue midline. Motor system exam symmetric upper extremity strength with mild weakness of right grip only. Poor effort in both lower extremities with 2/5 strength proximally bilaterally. Better strength distally.deep tendon reflexes are symmetric. Plantars are downgoing. Sensation appears intact. Gait not tested. ASSESSMENT/PLAN Mr. Kamare Caspers is a 84 y.o. male with history of DVT on Eliquis, stroke affecting his left side in 2014 with resultant left facial droop, left arm weakness, bilateral weakness requiring wheelchair bound status, HTN, diabetes presenting with increased slurred speech.   TIA on Eliquis in setting of poor risk factor control  CT head left preseptal soft tissue swelling potential cellulitis or mass.  Otherwise negative  MRI no acute stroke.  Moderate to severe small vessel disease.  Old right basal ganglia lacune.  MRA no LVO.  Loss of basilar artery signal concerning for stenosis though likely motion artifact  CTA head & neck mild atherosclerosis.  Severe posterior circulation disease.  No acute infarct seen on MRI.Marland Kitchen  Carotid Doppler no ICA stenosis.  Vertebral artery antegrade  2D Echo EF 55 to 6%.  No SOE  LDL 171  HgbA1c 9.0  Eliquis for VTE prophylaxis  Eliquis (apixaban) daily prior to admission, now on Eliquis (apixaban)  daily.  Continue Eliquis at discharge  Therapy recommendations: Home health PT.  Supervision.  Home health aide. (24-hour care provided by daughter and granddaughter)  Disposition:  Return home planned  DVT left lower extremity  On Eliquis 2.5 mg twice daily   Continue Eliquis at discharge  Hypertension  Stable . BP goal normotensive  Hyperlipidemia  Home meds: Zocor 20, resumed in hospital  LDL 171, goal < 70  Continue statin at discharge  Diabetes type II  HgbA1c 9.0, goal < 7.0  Uncontrolled  Other Stroke Risk Factors  Advanced age  Obesity, Body mass index is 37.77 kg/m., recommend weight loss, diet and exercise as appropriate   Hx stroke/TIA  2014 -right basal ganglia infarct with resultant left hemiparesis.   Other Active Problems  Left preseptal soft tissue swelling.  Iron deficiency anemia  Hospital day # 0  Tom Knuckles, APRN, ANVP-BC, AGPCNP-BC Advanced Practice Stroke Nurse [facility] See Amion for Schedule & Pager information 03/24/2018 4:35 PM  I have personally examined this patient, reviewed notes, independently viewed imaging studies, participated in medical decision making and plan of care.ROS completed by me personally and pertinent positives fully documented  I have made any additions or clarifications directly to the above note. Agree with note above.  He presented with slurred speech and right facial weakness possibly from small left brain infarct versus TIA and MRI showed no acute infarct. He appears drowsy and sleepy at this morning. Recommend IV hydration with fluids and repeat CT scan of the head. Continue eliquis for stroke prevention given history of recent DVT. Long discussion of the bedside with the patient's daughter and answered questions. Discussed with Dr. Gwyneth Revels. Greater than 50% time during the study family does at this point in counseling and coordination of care about s TIA and stroke and answered  questions  Delia Heady, MD Medical Director [facility] Pager: [phone] 03/24/2018 4:39 PM  To contact Stroke Continuity provider, please refer to [email or web address]. After hours, contact General Neurology

## 2018-03-24 NOTE — Progress Notes (Signed)
Pt drowsy and difficult to stay awake but follow some command. Pt was nonverbal when asked questions and pt daughter informed RN that's not normal for pt when asked. Pt VSS: Dr. Waymon AmatoHongalgi notified and informed RN to notify Neurologist and will be in to see pt. Dr. Pearlean BrownieSethi paged and notified when he returned call back and he said he will come see pt. Dr. Waymon AmatoHongalgi in at bedside. Will continue to closely monitor pt. Dionne Bucy. Amo Sydne Krahl RN    03/24/18 0908  Vitals  Temp 98.1 F (36.7 C)  Temp Source Oral  BP 139/65  MAP (mmHg) 85  BP Location Right Arm  BP Method Automatic  Patient Position (if appropriate) Lying  Pulse Rate 70  Pulse Rate Source Dinamap  Resp 20  Oxygen Therapy  SpO2 99 %  O2 Device Room Air

## 2018-03-24 NOTE — Progress Notes (Signed)
Pt alert and verbally responsive; follow commands and pt daughter said pt seems to be back to his baseline. Will continue to closely monitor. Dionne BucyP. Amo Filippo Puls RN

## 2018-03-24 NOTE — Progress Notes (Addendum)
PROGRESS NOTE   Tom Wang  ZOX:[id number]    DOB: 12-22-1924    DOA: 03/23/2018  PCP: [name]   I have briefly reviewed patients previous medical records in [facility].  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  Admitted for possible TIA.  Neurology was consulted.  Completed TIA work-up.  Hospital course complicated by AMS/sedation on morning of 8/23 of unclear etiology.  Follow-up CT head requested by Neurology.   Assessment & Plan:   Principal Problem:   TIA (transient ischemic attack) Active Problems:   Essential hypertension   Hyperlipidemia   DM type 2 (diabetes mellitus, type 2) (HCC)   H/O: CVA (cerebrovascular accident)   Left hemiparesis (HCC)   Chronic anticoagulation- for h/o LLE DVT, on Eliquis.   Suspected TIA: Resultant dysarthria but possibly no true left eye ptosis.  Neurology consulted and assisting with evaluation.  CT head and MRI had negative for acute stroke. MRA Head report as below and showed findings concerning for basilar artery stenosis.  Thereby CTA head obtained which showed severe disease in the posterior circulation.  Fetal origin of posterior cerebral arteries bilaterally.  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   LDL 171, A1c 9.  TTE showed LVEF 55 to 60% and no cardiac source of embolism noted.  Dopplers: 1-39% bilateral ICA stenosis.  Therapies evaluated and recommend home health PT with 24-hour supervision as  prior.  Since on Eliquis anticoagulation, antiplatelets not added and will defer to neurology.  Patient is nonambulatory at baseline and hence not sure benefit of therapies but will request PT, OT and ST evaluation.  Mental status change: Noted on 8/23 morning.  Etiology not clear.  Not hypoxic or hypoglycemic.  Patient has had extensive stroke work-up in the last 24 hours and no stroke noted.  Probably not neurological.  Could be catching up on sleep, has been sleep deprived for the last 2 days due to hospitalization, ED weight etc.  Resolved by late morning and mental status back to baseline.  Neurology apparently have requested repeat CT head and have started him on IV fluids.  Abnormal MRA Brain: Intermittent loss of basilar artery signal concerning for Stenosis.  Neurology followed this up with CTA of the head with findings as above.  Prior right brain stroke with residual left hemiparesis: At baseline.  Continue evaluation as above and continue Eliquis.  Essential hypertension: Allow permissive hypertension (up to 220/120) for 5 days due to suspected TIA.  Hold ARB for today.  Hyperlipidemia: LDL 171, consider changing simvastatin 20 mg daily to atorvastatin 80 mg daily.  Type II DM: Patient apparently allergic to Lantus.  Continue prior home oral hypoglycemics with close monitoring.    A1c 9.  Hesitant to start SSI due to unclear allergy to Lantus.  Currently on glipizide 10 mg twice daily, obviously poorly controlled.  May need to consider adding a second agent as outpatient for better control.  Goal <7.  Left preseptal soft tissue swelling: Unclear etiology.?  Allergic  reaction?  Mild viral conjunctivitis- not pinkeye currently.  This is likely contributing to drooping eyelid rather than neurological etiology.  Supportive treatment.  May consider outpatient ophthalmology consultation.  History of left lower extremity DVT: Continue chronic Eliquis.  Iron deficiency anemia: Continue  iron supplements and follow CBCs as outpatient.   DVT prophylaxis: Eliquis Code Status: Full Family Communication: Discussed in detail with patient's daughter at bedside and with granddaughter via speaker phone at bedside. Disposition: DC home pending clinical improvement, completion of work-up and sign off by neurology,?  8/24. Patient had decline in neurological status with drowsiness and difficulty arouse for several hours this morning, unclear etiology, being evaluated further and requires close monitoring over the next 24 hours to make sure he does not have recurrence of the same, also started on IV fluids, hence will need to stay additional night in the hospital.  He is at high risk for decline and recurrent hospitalization if he is discharged earlier than stated.  Hence will change status from observation to inpatient.   Consultants:  Neurology  Procedures:  As above  Antimicrobials:  None   Subjective: I was paged by patient's RN this morning stating that he was difficult to arouse.  I promptly arrived at bedside, interviewed patient in the presence of his daughter.  Daughter indicated that patient went to bed approximately midnight last night and had a good night sleep.  When she tried to wake him up this morning at around 8, he was difficult to arouse, more slurred, face leaning more towards the right side, somewhat disoriented.  Had not received any sedative medications overnight.  Patient somnolent but easily arousable, oriented to self, place and to his family.  ROS: As above, otherwise negative.  Objective:  Vitals:   03/24/18 0444 03/24/18 0719 03/24/18 0908 03/24/18 1148  BP: (!) 164/70 (!) 139/58 139/65 (!) 172/81  Pulse: 60 62 70 74  Resp: 18 18 20 18   Temp:  98.4 F (36.9 C) 98.1 F (36.7 C) 97.9 F (36.6 C)  TempSrc:  Axillary Oral Oral  SpO2: 94% 98% 99% 98%  Weight:      Height:        Examination:  General exam: Pleasant elderly male, moderately  built and obese lying comfortably propped up in bed without distress. Respiratory system: Clear to auscultation. Respiratory effort normal. Cardiovascular system: S1 & S2 heard, RRR. No JVD, murmurs, rubs, gallops or clicks. No pedal edema. Gastrointestinal system: Abdomen is nondistended, soft and nontender. No organomegaly or masses felt. Normal bowel sounds heard. Central nervous system: Mental status as indicated above.  Dysarthria, worse than yesterday morning.  Facial asymmetry with lean towards the right and diminished left nasolabial fold. Extremities: 4+ by 5 power in right upper extremity, 4 x 5 power in left upper extremity, 1 x 5 power in left lower extremity and 2 x 5 power in right lower extremity. Skin: No rashes, lesions or ulcers Psychiatry: Judgement and insight impaired. Mood & affect cannot assess.     Data Reviewed: I have personally reviewed following labs and imaging studies  CBC: Recent Labs  Lab 03/23/18 0317  WBC 7.9  NEUTROABS 4.6  HGB 11.6*  HCT 36.4*  MCV 81.3  PLT 185   Basic Metabolic Panel: Recent Labs  Lab 03/23/18 0317  NA 139  K 3.8  CL 106  CO2 26  GLUCOSE 217*  BUN 14  CREATININE 0.94  CALCIUM 9.7   Liver Function Tests: Recent Labs  Lab 03/23/18  0317  AST 18  ALT 12  ALKPHOS 57  BILITOT 0.7  PROT 6.6  ALBUMIN 3.5   Coagulation Profile: Recent Labs  Lab 03/23/18 0317  INR 1.11   HbA1C: Recent Labs    03/24/18 0333  HGBA1C 9.0*   CBG: Recent Labs  Lab 03/24/18 0809 03/24/18 1158  GLUCAP 176* 205*    No results found for this or any previous visit (from the past 240 hour(s)).       Radiology Studies: Ct Angio Head W Or Wo Contrast  Result Date: 03/23/2018 CLINICAL DATA:  Slurred speech. Hypertension diabetes. Left facial droop. Left-sided weakness. EXAM: CT ANGIOGRAPHY HEAD AND NECK TECHNIQUE: Multidetector CT imaging of the head and neck was performed using the standard protocol during bolus  administration of intravenous contrast. Multiplanar CT image reconstructions and MIPs were obtained to evaluate the vascular anatomy. Carotid stenosis measurements (when applicable) are obtained utilizing NASCET criteria, using the distal internal carotid diameter as the denominator. CONTRAST:  50mL ISOVUE-370 IOPAMIDOL (ISOVUE-370) INJECTION 76% COMPARISON:  MRI head 03/23/2018, CT head 03/23/2018 FINDINGS: CTA NECK FINDINGS Aortic arch: Mild atherosclerotic disease in the aortic arch without aneurysm or dissection. Four vessel aortic arch. Left vertebral artery origin from the arch. Right carotid system: Right carotid system widely patent without stenosis Left carotid system: Mild atherosclerotic disease left carotid bifurcation without significant stenosis. Vertebral arteries: Moderate stenosis distal right vertebral artery which does supply the basilar. Basilar is very small and severely diseased. Left vertebral artery origin from the arch is hypoplastic. Occlusion of the distal left vertebral artery after PICA Skeleton: Negative Other neck: Negative Upper chest: Linear soft tissue density with 5 mm calcification right upper lobe most compatible with scarring. Review of the MIP images confirms the above findings CTA HEAD FINDINGS Anterior circulation: Atherosclerotic disease in the cavernous carotid bilaterally without significant stenosis. Anterior and middle cerebral arteries patent bilaterally. Mild disease in the left middle cerebral artery. Mild to moderate stenosis distal right M1 segment. Posterior circulation: Fetal origin of the posterior cerebral artery bilaterally with hypoplastic posterior circulation. Distal left vertebral artery does not contribute to basilar which could be congenital or due to atherosclerotic disease. Moderate stenosis distal right vertebral artery. Basilar is very small and diffusely diseased with segmental occlusion in the midportion. PICA patent bilaterally. Superior cerebellar  arteries patent bilaterally. Moderately severe tandem stenoses in the left posterior cerebral artery. Mild stenosis right posterior cerebral artery. Venous sinuses: Patent Anatomic variants: None Delayed phase: Atrophy and chronic microvascular ischemic changes in the white matter. Normal enhancement. Review of the MIP images confirms the above findings IMPRESSION: Bilateral carotid and vertebral arteries are widely patent in the neck without significant stenosis. Mild atherosclerotic disease cavernous carotid artery and middle cerebral arteries bilaterally Severe disease in the posterior circulation. Fetal origin of the posterior cerebral arteries bilaterally. 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 This is of unknown duration. No acute infarct in the brainstem on MRI earlier today. Moderate stenosis left posterior cerebral artery and mild stenosis right posterior cerebral artery. These results were called by telephone at the time of interpretation on 03/23/2018 at 10:59 am to Dr. Otelia Limes, who verbally acknowledged these results. Electronically Signed   By: Marlan Palau M.D.   On: 03/23/2018 10:59   Dg Chest 2 View  Result Date: 03/23/2018 CLINICAL DATA:  TIA. EXAM: CHEST - 2 VIEW COMPARISON:  Radiograph 08/29/2016 FINDINGS: Unchanged heart size and mediastinal contours with borderline cardiomegaly. Aortic atherosclerosis.  No pulmonary edema, focal consolidation, pleural effusion or pneumothorax. Remote right clavicle fracture. IMPRESSION: 1. No acute findings. 2.  Aortic Atherosclerosis (ICD10-I70.0). Electronically Signed   By: Rubye OaksMelanie  Ehinger M.D.   On: 03/23/2018 03:28   Ct Head Wo Contrast  Result Date: 03/23/2018 CLINICAL DATA:  New onset LEFT eye droop. Chronic LEFT-sided weakness. History of stroke, hypertension, diabetes. EXAM: CT HEAD WITHOUT CONTRAST TECHNIQUE: Contiguous axial images were obtained from the base of the skull through the vertex  without intravenous contrast. COMPARISON:  CT HEAD Dec 13, 2014 FINDINGS: BRAIN: No intraparenchymal hemorrhage, mass effect nor midline shift. The ventricles and sulci are normal for age. Patchy supratentorial white matter hypodensities within normal range for patient's age, though non-specific are most compatible with chronic small vessel ischemic disease. No acute large vascular territory infarcts. No abnormal extra-axial fluid collections. Basal cisterns are patent. VASCULAR: Mild calcific atherosclerosis of the carotid siphons. SKULL: No skull fracture. Multiple dental caries. No significant scalp soft tissue swelling. SINUSES/ORBITS: Trace paranasal sinus mucosal thickening. Mastoid air cells are well aerated.The included ocular globes and orbital contents are non-suspicious. Status post RIGHT ocular lens implant. OTHER: LEFT preseptal swelling with soft tissue density. No subcutaneous gas or radiopaque foreign bodies. IMPRESSION: 1. LEFT preseptal soft tissue swelling, potential cellulitis or mass. 2. Negative non-contrast CT HEAD for age. Electronically Signed   By: Awilda Metroourtnay  Bloomer M.D.   On: 03/23/2018 03:23   Ct Angio Neck W Or Wo Contrast  Result Date: 03/23/2018 CLINICAL DATA:  Slurred speech. Hypertension diabetes. Left facial droop. Left-sided weakness. EXAM: CT ANGIOGRAPHY HEAD AND NECK TECHNIQUE: Multidetector CT imaging of the head and neck was performed using the standard protocol during bolus administration of intravenous contrast. Multiplanar CT image reconstructions and MIPs were obtained to evaluate the vascular anatomy. Carotid stenosis measurements (when applicable) are obtained utilizing NASCET criteria, using the distal internal carotid diameter as the denominator. CONTRAST:  50mL ISOVUE-370 IOPAMIDOL (ISOVUE-370) INJECTION 76% COMPARISON:  MRI head 03/23/2018, CT head 03/23/2018 FINDINGS: CTA NECK FINDINGS Aortic arch: Mild atherosclerotic disease in the aortic arch without aneurysm  or dissection. Four vessel aortic arch. Left vertebral artery origin from the arch. Right carotid system: Right carotid system widely patent without stenosis Left carotid system: Mild atherosclerotic disease left carotid bifurcation without significant stenosis. Vertebral arteries: Moderate stenosis distal right vertebral artery which does supply the basilar. Basilar is very small and severely diseased. Left vertebral artery origin from the arch is hypoplastic. Occlusion of the distal left vertebral artery after PICA Skeleton: Negative Other neck: Negative Upper chest: Linear soft tissue density with 5 mm calcification right upper lobe most compatible with scarring. Review of the MIP images confirms the above findings CTA HEAD FINDINGS Anterior circulation: Atherosclerotic disease in the cavernous carotid bilaterally without significant stenosis. Anterior and middle cerebral arteries patent bilaterally. Mild disease in the left middle cerebral artery. Mild to moderate stenosis distal right M1 segment. Posterior circulation: Fetal origin of the posterior cerebral artery bilaterally with hypoplastic posterior circulation. Distal left vertebral artery does not contribute to basilar which could be congenital or due to atherosclerotic disease. Moderate stenosis distal right vertebral artery. Basilar is very small and diffusely diseased with segmental occlusion in the midportion. PICA patent bilaterally. Superior cerebellar arteries patent bilaterally. Moderately severe tandem stenoses in the left posterior cerebral artery. Mild stenosis right posterior cerebral artery. Venous sinuses: Patent Anatomic variants: None Delayed phase: Atrophy and chronic microvascular ischemic changes in the white matter. Normal enhancement. Review of the MIP  images confirms the above findings IMPRESSION: Bilateral carotid and vertebral arteries are widely patent in the neck without significant stenosis. Mild atherosclerotic disease cavernous  carotid artery and middle cerebral arteries bilaterally Severe disease in the posterior circulation. Fetal origin of the posterior cerebral arteries bilaterally. 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 This is of unknown duration. No acute infarct in the brainstem on MRI earlier today. Moderate stenosis left posterior cerebral artery and mild stenosis right posterior cerebral artery. These results were called by telephone at the time of interpretation on 03/23/2018 at 10:59 am to Dr. Otelia Limes, who verbally acknowledged these results. Electronically Signed   By: Marlan Palau M.D.   On: 03/23/2018 10:59   Mr Maxine Glenn Head Wo Contrast  Result Date: 03/23/2018 CLINICAL DATA:  LEFT eye droop, garbled speech for 30 minutes. Now at baseline. History of stroke, hypertension, diabetes, anticoagulation. EXAM: MRI HEAD WITHOUT CONTRAST MRA HEAD WITHOUT CONTRAST TECHNIQUE: Multiplanar, multiecho pulse sequences of the brain and surrounding structures were obtained without intravenous contrast. Angiographic images of the head were obtained using MRA technique without contrast. COMPARISON:  CT HEAD March 23, 2018. MRI head report dated June 15, 2013 though images are not available for direct comparison. FINDINGS: MRI HEAD FINDINGS-moderately motion degraded examination. INTRACRANIAL CONTENTS: No reduced diffusion to suggest acute ischemia. No susceptibility artifact to suggest hemorrhage. The ventricles and sulci are normal for patient's age. Patchy to confluent pontine and supratentorial white matter FLAIR T2 hyperintensities tracking into RIGHT internal capsule. Old RIGHT basal ganglia lacunar infarct. No suspicious parenchymal signal, masses, mass effect. No abnormal extra-axial fluid collections. No extra-axial masses. VASCULAR: Normal major intracranial vascular flow voids present at skull base. SKULL AND UPPER CERVICAL SPINE: No abnormal sellar expansion. No suspicious  calvarial bone marrow signal. Craniocervical junction maintained. SINUSES/ORBITS: Trace paranasal sinus mucosal thickening. LEFT maxillary mucosal retention cyst. Trace LEFT mastoid effusion. The included ocular globes and orbital contents are non-suspicious. Status post RIGHT ocular lens implant. OTHER: None. MRA HEAD FINDINGS ANTERIOR CIRCULATION: Normal flow related enhancement of the included cervical, petrous, cavernous and supraclinoid internal carotid arteries. Mild luminal irregularity RIGHT supraclinoid internal carotid artery. Patent anterior communicating artery. Patent anterior and middle cerebral arteries. POSTERIOR CIRCULATION: Due to motion, vertebral arteries not well characterized. Tandem loss of basilar artery flow related enhancement. Robust bilateral posterior communicating arteries present. Patent posterior cerebral arteries, moderate stenosis seen with atherosclerosis or motion artifact. ANATOMIC VARIANTS: Aplastic RIGHT A1 segment. Source images and MIP images were reviewed. IMPRESSION: MRI HEAD: 1. No acute intracranial process on this moderately motion degraded examination. 2. Moderate to severe chronic small vessel ischemic changes. Old RIGHT basal ganglia lacunar infarct. MRA HEAD: 1. Moderately motion degraded examination. No emergent large vessel occlusion. 2. Intermittent loss of basilar artery signal concerning for stenosis though, may be overestimated by motion artifact. Findings would be better demonstrated on CTA HEAD as clinically indicated. Electronically Signed   By: Awilda Metro M.D.   On: 03/23/2018 06:11   Mr Brain Wo Contrast  Result Date: 03/23/2018 CLINICAL DATA:  LEFT eye droop, garbled speech for 30 minutes. Now at baseline. History of stroke, hypertension, diabetes, anticoagulation. EXAM: MRI HEAD WITHOUT CONTRAST MRA HEAD WITHOUT CONTRAST TECHNIQUE: Multiplanar, multiecho pulse sequences of the brain and surrounding structures were obtained without intravenous  contrast. Angiographic images of the head were obtained using MRA technique without contrast. COMPARISON:  CT HEAD March 23, 2018. MRI head report dated June 15, 2013 though images are  not available for direct comparison. FINDINGS: MRI HEAD FINDINGS-moderately motion degraded examination. INTRACRANIAL CONTENTS: No reduced diffusion to suggest acute ischemia. No susceptibility artifact to suggest hemorrhage. The ventricles and sulci are normal for patient's age. Patchy to confluent pontine and supratentorial white matter FLAIR T2 hyperintensities tracking into RIGHT internal capsule. Old RIGHT basal ganglia lacunar infarct. No suspicious parenchymal signal, masses, mass effect. No abnormal extra-axial fluid collections. No extra-axial masses. VASCULAR: Normal major intracranial vascular flow voids present at skull base. SKULL AND UPPER CERVICAL SPINE: No abnormal sellar expansion. No suspicious calvarial bone marrow signal. Craniocervical junction maintained. SINUSES/ORBITS: Trace paranasal sinus mucosal thickening. LEFT maxillary mucosal retention cyst. Trace LEFT mastoid effusion. The included ocular globes and orbital contents are non-suspicious. Status post RIGHT ocular lens implant. OTHER: None. MRA HEAD FINDINGS ANTERIOR CIRCULATION: Normal flow related enhancement of the included cervical, petrous, cavernous and supraclinoid internal carotid arteries. Mild luminal irregularity RIGHT supraclinoid internal carotid artery. Patent anterior communicating artery. Patent anterior and middle cerebral arteries. POSTERIOR CIRCULATION: Due to motion, vertebral arteries not well characterized. Tandem loss of basilar artery flow related enhancement. Robust bilateral posterior communicating arteries present. Patent posterior cerebral arteries, moderate stenosis seen with atherosclerosis or motion artifact. ANATOMIC VARIANTS: Aplastic RIGHT A1 segment. Source images and MIP images were reviewed. IMPRESSION: MRI HEAD: 1.  No acute intracranial process on this moderately motion degraded examination. 2. Moderate to severe chronic small vessel ischemic changes. Old RIGHT basal ganglia lacunar infarct. MRA HEAD: 1. Moderately motion degraded examination. No emergent large vessel occlusion. 2. Intermittent loss of basilar artery signal concerning for stenosis though, may be overestimated by motion artifact. Findings would be better demonstrated on CTA HEAD as clinically indicated. Electronically Signed   By: Awilda Metro M.D.   On: 03/23/2018 06:11        Scheduled Meds: . acetaminophen  500 mg Oral Daily  . acidophilus  1 capsule Oral Daily  . apixaban  2.5 mg Oral BID  . calcium citrate  200 mg of elemental calcium Oral Daily  . cholecalciferol  500 Units Oral QHS  . ferrous sulfate  325 mg Oral Q breakfast  . glipiZIDE  10 mg Oral BID AC  . magnesium oxide  200 mg Oral QHS  . multivitamin with minerals  1 tablet Oral Daily  . pantoprazole  40 mg Oral Daily  . senna-docusate  2 tablet Oral Daily  . simvastatin  20 mg Oral q1800  . vitamin B-12  1,000 mcg Oral Daily   Continuous Infusions: . sodium chloride 70 mL/hr at 03/24/18 1122     LOS: 0 days     Marcellus Scott, MD, FACP, [facility]. [facility] Pager [phone]  If 7PM-7AM, please contact night-coverage [email or web address] Password [email or web address] 03/24/2018, 2:21 PM

## 2018-03-24 NOTE — Progress Notes (Signed)
  Transitions of Care Polypharmacy Medication Deprescribing Review  Tom Wang is a 84 y.o. male admitted with TIA on 03/23/2018.   The patient's PTA medications and comorbidities were reviewed to identify opportunities for medication optimization through deprescribing.   Short-Term Recommendations: . Consider discontinuing glipizide due to risks for hypoglycemia, also potential for ineffectiveness as used since diagnosis (>20 years). Consider A1c goals for patient and whether additional antidiabetic agents would be beneficial.   Long-Term Recommendations: . Consider discontinuing vitamins/supplements if burden of taking the medications outweighs potential benefits from use. Consider checking vitamin levels to assess need for continued supplementation. . Consider switching from omeprazole to H2 receptor blocker for reduced adverse event risks if symptom control still needed.   Patient Assessment:  . Extent of Polypharmacy: 17 PTA medications, hyperpolypharmacy (10+ meds) o Associated risk: severe o Number of home Rx meds: 4 o Number of home OTC meds: 17 . Charlson Comorbidity Index (CCI): 8 o Associated risk: morbid (0% estimated 10-year survival) . GerontoNet Adverse Drug Event Risk Score: 5 o Associated risk: minor-moderate (~7% risk ADE)  Overall risk: moderate; polypharmacy due to OTC and supplement medications, lower ADE risk than could be expected but still significant comorbidity risks with CVA hx    Medication Assessment:  . Potentially Inappropriate Medications (PIMs)  o High risk medications: 0 o Moderate risk medications: 2 - Glipizide, omeprazole o Low risk medications: 0 . Medications Associated with Geriatric Syndromes (MAGS): 4 o Glipizide, losartan, omeprazole, senna-docusate  Overall risk: moderate; no high risk PIMs but moderate risk meds could be optimized   The patient was interviewed to determine his interest in making medication changes. Shared  decision-making was used to determine the optimal medications to deprescribe.   Patient Deprescribing Readiness: . Patient Opinions About Deprescribing survey score: 8 o Score of -8 to -6: negative patient opinion of deprescribing; deprescribing likely to be unsuccessful o Score of -4 to 4: intermediate patient opinion of deprescribing; deprescribing may be successful o Score of 6 to 8: positive patient opinion of deprescribing; deprescribing likely to be successful   Shared Decision-Making Discussion:  . Patient was fatigued/sleeping so daughter provided information during the interview. Patient's daughter expressed interest in deprescribing, expressing that she does not think he needs to be on so many medications and wants to make sure they are all still necessary. Daughter was knowledgeable about medication names and usages, but conversation was limited due to phone call. Patient opinions on taking each vitamin/supplement were not discussed. . Patient's daughter was advised of the hypoglycemia risks associated with glipizide. She was not aware of alternative antidiabetic agents that had been tried, but did express interest in using a medication with fewer risks.   Please see above for final recommendations.  Greer PickerelJessica Icholas Irby, Student-PharmD 03/24/2018 1:29 PM

## 2018-03-25 DIAGNOSIS — Z8673 Personal history of transient ischemic attack (TIA), and cerebral infarction without residual deficits: Secondary | ICD-10-CM

## 2018-03-25 DIAGNOSIS — G8194 Hemiplegia, unspecified affecting left nondominant side: Secondary | ICD-10-CM

## 2018-03-25 LAB — GLUCOSE, CAPILLARY
GLUCOSE-CAPILLARY: 246 mg/dL — AB (ref 70–99)
GLUCOSE-CAPILLARY: 241 mg/dL — AB (ref 70–99)

## 2018-03-25 MED ORDER — LOSARTAN POTASSIUM 25 MG PO TABS: 12.5000 mg | ORAL_TABLET | Freq: Every day | ORAL | Status: DC

## 2018-03-25 MED ORDER — EZETIMIBE 10 MG PO TABS: 10.0000 mg | ORAL_TABLET | Freq: Every day | ORAL | 0 refills | Status: DC

## 2018-03-25 NOTE — Progress Notes (Signed)
[facility] arrived and pt discharged via stretcher with transporters, family took pt belongings with them.

## 2018-03-25 NOTE — Progress Notes (Signed)
Discharge instructions reviewed with pt, daughter and granddaughter, POA. Handout on new med zetia given, along with continued home med eliquis. And TIA handout.  Copy of instructions given to them.  Pt waiting for [facility] to arrive for transportation home.

## 2018-03-25 NOTE — Evaluation (Signed)
Speech Language Pathology Evaluation Patient Details Name: Tom Wang MRN: [id number] DOB: [birth year] Today's Date: 03/25/2018 Time: [id number] SLP Time Calculation (min) (ACUTE ONLY): 28 min  Problem List:  Patient Active Problem List   Diagnosis Date Noted  . Somnolence   . TIA (transient ischemic attack) 03/23/2018  . DM type 2 (diabetes mellitus, type 2) (HCC) 03/23/2018  . H/O: CVA (cerebrovascular accident) 03/23/2018  . Left hemiparesis (HCC) 03/23/2018  . Chronic anticoagulation- for h/o LLE DVT, on Eliquis. 03/23/2018  . Gastroesophageal reflux disease without esophagitis 08/29/2016  . Essential hypertension 08/29/2016  . Hyperlipidemia 08/29/2016   Past Medical History:  Past Medical History:  Diagnosis Date  . Arthritis    ankles  . Diabetes mellitus    oral meds only  . GERD (gastroesophageal reflux disease)   . Hypertension   . Pneumonia    20 years ago .  Marland Kitchen. Stroke [facility](HCC)    Past Surgical History:  Past Surgical History:  Procedure Laterality Date  . CATARACT EXTRACTION W/PHACO  04/26/2012   Procedure: CATARACT EXTRACTION PHACO AND INTRAOCULAR LENS PLACEMENT (IOC);  Surgeon: Chalmers Guestoy Whitaker, MD;  [facility];  Service: Ophthalmology;  Laterality: Right;  . TONSILLECTOMY    . TUMOR EXCISION     60 years ago .. right forearm .    HPI:   84 year old male, lives with his daughter and granddaughter (healthcare power of attorney), nonambulatory and moves around with the help of a wheelchair and left knee, PMH of CVA 2014 with residual left hemiparesis, DVT left lower extremity 2015 on chronic Eliquis anticoagulation since, DM 2, HTN, HLD, GERD, iron deficiency anemia, glaucoma [facility]/2018 with above complaints MRI head/CT head negative for acute process.  Assessment / Plan / Recommendation Clinical Impression   Limited assessment with pt consuming lunch during SLE; pt exhibits left facial/lingual sensory/motor deficits at baseline, but  family feels this has become more prominent as speech intelligibility within sentences-conversation has declined; speech noted to be approximately 49-75% intelligible depending on complexity of utterances; strategies utilized which improved intelligibility included decreased rate and increased vocal intensity during speaking tasks.  Pt exhibits an overall processing delay which is likely his baseline from previous CVA; [facility] overall speech intelligibility and family agreed that this may be beneficial to increase overall communicative competency.  ST will s/o in acute setting, but recommend HH ST to facilitate improvement in functional communication.    SLP Assessment  SLP Recommendation/Assessment: All further Speech Language Pathology  needs can be addressed in the next venue of care SLP Visit Diagnosis: Dysarthria and anarthria (R47.1)    Follow Up Recommendations  Home health SLP;Other (comment);24 hour supervision/assistance(prn)    Frequency and Duration   Evaluation only        SLP Evaluation Cognition  Overall Cognitive Status: History of cognitive impairments - at baseline Arousal/Alertness: Awake/alert Orientation Level: Oriented to person;Oriented to place;Oriented to situation;Disoriented to time Awareness: Impaired Awareness Impairment: Emergent impairment Problem Solving: Impaired Problem Solving Impairment: Verbal basic;Functional basic Safety/Judgment: Appears intact       Comprehension  Auditory Comprehension Overall Auditory Comprehension: Impaired at baseline Yes/No Questions: Within Functional Limits Commands: Within Functional Limits Conversation: Simple Interfering Components: Processing speed EffectiveTechniques: Extra processing time;Repetition Visual Recognition/Discrimination Discrimination: Not tested Reading Comprehension Reading Status: Not tested    Expression Expression Primary Mode of Expression:  Verbal Verbal Expression Overall Verbal Expression: Impaired at baseline Level of Generative/Spontaneous Verbalization: Sentence Pragmatics: Unable to  assess Interfering Components: Premorbid deficit Non-Verbal Means of Communication: Not applicable Written Expression Written Expression: Not tested   Oral / Motor  Oral Motor/Sensory Function Overall Oral Motor/Sensory Function: Mild impairment Facial ROM: Reduced left Facial Symmetry: Abnormal symmetry left Facial Strength: Reduced left Facial Sensation: Reduced left Lingual ROM: Reduced left Lingual Symmetry: Abnormal symmetry left Lingual Strength: Reduced Lingual Sensation: Reduced Mandible: Impaired Motor Speech Overall Motor Speech: Appears within functional limits for tasks assessed Respiration: Impaired Level of Impairment: Sentence Phonation: Low vocal intensity Resonance: Within functional limits Articulation: Impaired Level of Impairment: Sentence Intelligibility: Intelligibility reduced Word: 50-74% accurate Phrase: 50-74% accurate Sentence: 50-74% accurate Conversation: 50-74% accurate Motor Speech Errors: Not applicable Interfering Components: Premorbid status Effective Techniques: Increased vocal intensity   GO                    Tressie Stalker 03/25/2018, 1:11 PM

## 2018-03-25 NOTE — Progress Notes (Signed)
RN working on having pharmacy to come see pt/family to update/clarify pt's med rec.  Pharmacy staff came earlier as RN had requested them to come up to pt room and update pt's med rec. Pt's daughter had informed RN throughout the day that he has not been taking the zocor at home, makes his hands swell, and also later when neuro PA tried to resume home med cozaar, pt's daughter states he is allergic--but it is not listed on his allergy list, daughter pulls out her cell phone and shows RN a text message from the granddaughter that pt not to take losartan (cozaar) because it was a "recalled med".    Therefore pharmacy called to update med rec, they came but these meds still on pt's profile. Made several phone calls to pharmacy to find staff person that came up.  Daughter states that the pharmacy person spoke of another BP med and was coming back to pt's room.  No one has returned.   Spoke with Pharmacist Windell MouldingRuth, she will try to get in touch with the pharm staff member that had been up to see pt, and work on clarifying med rec.  MD waiting for clarification so that pt can be discharged.

## 2018-03-25 NOTE — Care Management Note (Signed)
Case Management Note  Patient Details  Name: Rosalyn GessLawrence Pfannenstiel MRN: [id number] Date of Birth: 1924-12-23  Subjective/Objective:      Pt from home with family caregivers, daughter and grandaughter.  Pt has DME hospital bed, wheelchair, and all other necessary DME.  Pt has used [facility] in the past and would like to use again.          Action/Plan: Referral called to JErmaine with [facility].  PMD is Dr[facility]  [facility] called to transport patient.  ETA 1-2 hours.  Number is call back is [phone].  [place] form on shadow chart.  Expected Discharge Date:  03/25/18               Expected Discharge Plan:  Home w Home Health Services  In-House Referral:  NA  Discharge planning Services  CM Consult  Post Acute Care Choice:  Home Health Choice offered to:  Patient  DME Arranged:  N/A DME Agency:  NA  HH Arranged:  PT, OT, Speech Therapy HH Agency:  [facility]  Status of Service:  Completed, signed off  If discussed at Long Length of Stay Meetings, dates discussed:    Additional Comments:  Deveron Furlongshley  Keondrick Dilks, RN 03/25/2018, 5:15 PM

## 2018-03-25 NOTE — Discharge Summary (Signed)
Physician Discharge Summary  Tom Wang ZHY:[id number] DOB: [birth year]  PCP: [name]  Admit date: 03/23/2018 Discharge date: 03/25/2018  Recommendations for Outpatient Follow-up:  1. PCP at the [facility] in 5 days. 2. Consider outpatient ophthalmology consultation. 3. Consider outpatient dental surgery consultation.  Home Health: PT, OT and SLP. Equipment/Devices: None.  Discharge Condition: Improved and stable. CODE STATUS: Full. Diet recommendation: Heart healthy & diabetic diet.  Discharge Diagnoses:  Principal Problem:   TIA (transient ischemic attack) Active Problems:   Essential hypertension   Hyperlipidemia   DM type 2 (diabetes mellitus, type 2) (HCC)   H/O: CVA (cerebrovascular accident)   Left hemiparesis (HCC)   Chronic anticoagulation- for h/o LLE DVT, on Eliquis.   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  Admitted for possible TIA.  Neurology was consulted.  Completed TIA work-up.  Hospital course complicated by AMS/sedation on morning of 8/23 of unclear etiology which resolved.   Assessment & Plan:    Suspected TIA: Resultant dysarthria but possibly no true left eye ptosis. Neurology consulted and assisting with evaluation. CT head and MRI had negative for acute stroke. MRA Head report as below and showed findings concerning for basilar artery stenosis.  Thereby CTA head obtained which showed severe disease in the posterior circulation.  Fetal origin of posterior cerebral  arteries bilaterally.  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  LDL 171, A1c 9.  TTE showed LVEF 55 to 60% and no cardiac source of embolism noted.  Dopplers: 1-39% bilateral ICA stenosis.  Therapies evaluated and recommend home health PT with 24-hour supervision as prior.  Since on Eliquis anticoagulation, antiplatelets not added and will defer to neurology. I discussed in detail with Dr. Roda Shutters, Stroke MD and at this time not entirely convinced that his presentation is due to TIA (probably recrudescence of old stroke) or more likely related to sleep-wake cycle disturbance.  Neurology recommends no outpatient neurology follow-up.  Neurology did recommend follow-up with PCP to decide on appropriate dose of Eliquis which should be 5 mg twice daily but not clear why he is on reduced dose.  Mental status change/??encephalopathy: Noted on 8/23 morning.  Etiology not clear.  Not hypoxic or hypoglycemic.  Patient has had extensive stroke work-up and no stroke noted.  Probably not neurological.  Could be catching up on sleep, has been sleep deprived for the last 2 days due to hospitalization, ED weight etc.  Resolved by late morning and mental status back to baseline.    Follow-up CT head without acute abnormalities.  Patient's family concerned due to persistent some facial droop and left upper eyelid droop that is worse than baseline and at times more sleepier than usual.  Patient has been extensively evaluated.  Not on medications that would contribute.  No significant metabolic abnormalities or infectious etiology noted.  I discussed in detail with stroke MD and at this time feel that this may be related to sleep-wake cycle disturbance and family counseled regarding trying to establish a consistent  pattern of sleep.  They verbalized understanding.  Close outpatient follow-up with PCP.  Abnormal MRA Brain:Intermittent loss of basilar artery  signal concerning for Stenosis.  Neurology followed this up with CTA of the head with findings as above.  Prior right brain stroke with residual left hemiparesis: At baseline. Continue evaluation as above and continue Eliquis.  Essential hypertension: Allow permissive hypertension (up to 220/120) for 5 days due to suspected TIA. As per today's information, granddaughter indicated that patient had not been taking losartan PTA although listed on his home medications (apparently due to recall news).  As discussed with neurology, avoid hypotension due to posterior circulation issues.  Family reluctant to start new medications/antihypertensives.  Advised them to closely follow with outpatient PCP regarding antihypertensive initiation.  Hyperlipidemia: LDL 171.  Although Zocor was listed on patient's PTA home medication list, family today indicated that he had not been taking this due to their concerns for body swelling.  Had long discussion with granddaughter, unwilling/reluctant to consider alternate statin and wanted to start Zetia recommended by pharmacy.  Outpatient follow-up.  Type II DM: Patient apparently allergic to Lantus. Continue prior home oral hypoglycemics with close monitoring.   A1c 9.  Currently on glipizide 10 mg twice daily, obviously poorly controlled.  May need to consider adding a second agent as outpatient for better control.  Goal <7.  As per my discussion in detail with family, family skeptical about lots of medications and concerned about several albeit rare side effects.  Needs to closely follow-up with PCP regarding further management.  Left preseptal soft tissue swelling: Unclear etiology.? Allergic reaction? Mild viral conjunctivitis- not pinkeye currently. This is likely contributing to drooping eyelid rather than neurological etiology. Supportive treatment. May consider outpatient ophthalmology consultation.  History of left lower extremity DVT: Continue chronic  Eliquis, please see above regarding dosing discussion.  Iron deficiency anemia: Continue iron supplements and follow CBCs as outpatient.    Consultants:  Neurology  Procedures:  As above   Discharge Instructions  Discharge Instructions    Call MD for:   Complete by:  As directed    Recurrent altered mental status, sleepiness or confusion.  Recurrent strokelike symptoms.   Diet - low sodium heart healthy   Complete by:  As directed    Diet Carb Modified   Complete by:  As directed    Increase activity slowly   Complete by:  As directed        Medication List    STOP taking these medications   losartan 25 MG tablet Commonly known as:  COZAAR   simvastatin 40 MG tablet Commonly known as:  ZOCOR     TAKE these medications   acetaminophen 500 MG tablet Commonly known as:  TYLENOL Take 500 mg by mouth daily.   ACETYL L-CARNITINE PO Take 400 mg by mouth daily.   Alpha-Lipoic Acid 200 MG Tabs Take 200 mg by mouth daily.   apixaban 2.5 MG Tabs tablet Commonly known as:  ELIQUIS Take 2.5 mg by mouth 2 (two) times daily.   Calcium Citrate 333 MG Tabs Take 333 mg by mouth daily.   cholecalciferol 1000 units tablet Commonly known as:  VITAMIN D Take 500 Units by mouth at bedtime.   CULTURELLE Caps Take 1 capsule by mouth daily.   ezetimibe 10 MG tablet Commonly known as:  ZETIA Take 1 tablet (10 mg total) by mouth daily.   ferrous sulfate 325 (65 FE) MG EC tablet Take 325 mg by mouth daily  with breakfast.   glipiZIDE 10 MG tablet Commonly known as:  GLUCOTROL Take 10 mg by mouth 2 (two) times daily before a meal.   Magnesium 250 MG Tabs Take 250 mg by mouth at bedtime.   multivitamin with minerals Tabs tablet Take 1 tablet by mouth daily.   omeprazole 20 MG capsule Commonly known as:  PRILOSEC Take 20 mg by mouth daily.   senna-docusate 8.6-50 MG tablet Commonly known as:  Senokot-S Take 2 tablets by mouth daily.   Turmeric Curcumin  Caps Take 1 capsule by mouth daily.   vitamin B-12 1000 MCG tablet Commonly known as:  CYANOCOBALAMIN Take 1,000 mcg by mouth daily.      Follow-up Information    [facility] Schedule an appointment as soon as possible for a visit in 5 day(s).   Why:  Follow-up regarding management of your diabetes, high blood pressure, high cholesterol.  Also follow-up regarding ophthalmology (eye doctor) and dentist(teeth) consultation. Contact information: [address] [facility] [address] [place] [address] [phone]        [facility], [facility] Follow up.   Specialty:  Home Health Services Why:  They will contact you to schedule visits.  Contact information: [address] [place] [address] [phone]          Allergies  Allergen Reactions  . Morphine And Related Other (See Comments)    SEVERE REACTION  . Other Other (See Comments)    NO NARCOTICS PER FAMILY  . Gabapentin Other (See Comments)    AGITATION, COMBATIVE  . Lantus [Insulin Glargine] Other (See Comments)    unknown  . Penicillins Other (See Comments)    UNKNOWN      Procedures/Studies: Ct Angio Head W Or Wo Contrast  Result Date: 03/23/2018 CLINICAL DATA:  Slurred speech. Hypertension diabetes. Left facial droop. Left-sided weakness. EXAM: CT ANGIOGRAPHY HEAD AND NECK TECHNIQUE: Multidetector CT imaging of the head and neck was performed using the standard protocol during bolus administration of intravenous contrast. Multiplanar CT image reconstructions and MIPs were obtained to evaluate the vascular anatomy. Carotid stenosis measurements (when applicable) are obtained utilizing NASCET criteria, using the distal internal carotid diameter as the denominator. CONTRAST:  50mL ISOVUE-370 IOPAMIDOL (ISOVUE-370) INJECTION 76% COMPARISON:  MRI head 03/23/2018, CT head 03/23/2018 FINDINGS: CTA NECK FINDINGS Aortic arch: Mild atherosclerotic disease in the aortic arch without aneurysm  or dissection. Four vessel aortic arch. Left vertebral artery origin from the arch. Right carotid system: Right carotid system widely patent without stenosis Left carotid system: Mild atherosclerotic disease left carotid bifurcation without significant stenosis. Vertebral arteries: Moderate stenosis distal right vertebral artery which does supply the basilar. Basilar is very small and severely diseased. Left vertebral artery origin from the arch is hypoplastic. Occlusion of the distal left vertebral artery after PICA Skeleton: Negative Other neck: Negative Upper chest: Linear soft tissue density with 5 mm calcification right upper lobe most compatible with scarring. Review of the MIP images confirms the above findings CTA HEAD FINDINGS Anterior circulation: Atherosclerotic disease in the cavernous carotid bilaterally without significant stenosis. Anterior and middle cerebral arteries patent bilaterally. Mild disease in the left middle cerebral artery. Mild to moderate stenosis distal right M1 segment. Posterior circulation: Fetal origin of the posterior cerebral artery bilaterally with hypoplastic posterior circulation. Distal left vertebral artery does not contribute to basilar which could be congenital or due to atherosclerotic disease. Moderate stenosis distal right vertebral artery. Basilar is very small and diffusely diseased with segmental occlusion in the  midportion. PICA patent bilaterally. Superior cerebellar arteries patent bilaterally. Moderately severe tandem stenoses in the left posterior cerebral artery. Mild stenosis right posterior cerebral artery. Venous sinuses: Patent Anatomic variants: None Delayed phase: Atrophy and chronic microvascular ischemic changes in the white matter. Normal enhancement. Review of the MIP images confirms the above findings IMPRESSION: Bilateral carotid and vertebral arteries are widely patent in the neck without significant stenosis. Mild atherosclerotic disease cavernous  carotid artery and middle cerebral arteries bilaterally Severe disease in the posterior circulation. Fetal origin of the posterior cerebral arteries bilaterally. 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 This is of unknown duration. No acute infarct in the brainstem on MRI earlier today. Moderate stenosis left posterior cerebral artery and mild stenosis right posterior cerebral artery. These results were called by telephone at the time of interpretation on 03/23/2018 at 10:59 am to Dr. Otelia Limes, who verbally acknowledged these results. Electronically Signed   By: Marlan Palau M.D.   On: 03/23/2018 10:59   Dg Chest 2 View  Result Date: 03/23/2018 CLINICAL DATA:  TIA. EXAM: CHEST - 2 VIEW COMPARISON:  Radiograph 08/29/2016 FINDINGS: Unchanged heart size and mediastinal contours with borderline cardiomegaly. Aortic atherosclerosis. No pulmonary edema, focal consolidation, pleural effusion or pneumothorax. Remote right clavicle fracture. IMPRESSION: 1. No acute findings. 2.  Aortic Atherosclerosis (ICD10-I70.0). Electronically Signed   By: Rubye Oaks M.D.   On: 03/23/2018 03:28   Ct Head Wo Contrast  Result Date: 03/24/2018 CLINICAL DATA:  84 y/o  M; stroke for follow-up. EXAM: CT HEAD WITHOUT CONTRAST TECHNIQUE: Contiguous axial images were obtained from the base of the skull through the vertex without intravenous contrast. COMPARISON:  03/23/2018 CT head, MRI head, CTA head. FINDINGS: Brain: No evidence of acute infarction, hemorrhage, hydrocephalus, extra-axial collection or mass lesion/mass effect. Stable chronic microvascular ischemic changes and volume loss of the brain Vascular: Calcific atherosclerosis of the carotid siphons. Skull: Normal. Negative for fracture or focal lesion. Sinuses/Orbits: Mild mucosal thickening of the left maxillary sinus with a small mucous retention cyst. Normal aeration of the mastoid air cells. Right intra-ocular lens  replacement. Left periorbital soft tissue swelling is stable. Other: None. IMPRESSION: No acute intracranial abnormality. Stable chronic microvascular ischemic changes and volume loss of the brain. Electronically Signed   By: Mitzi Hansen M.D.   On: 03/24/2018 20:21   Ct Head Wo Contrast  Result Date: 03/23/2018 CLINICAL DATA:  New onset LEFT eye droop. Chronic LEFT-sided weakness. History of stroke, hypertension, diabetes. EXAM: CT HEAD WITHOUT CONTRAST TECHNIQUE: Contiguous axial images were obtained from the base of the skull through the vertex without intravenous contrast. COMPARISON:  CT HEAD Dec 13, 2014 FINDINGS: BRAIN: No intraparenchymal hemorrhage, mass effect nor midline shift. The ventricles and sulci are normal for age. Patchy supratentorial white matter hypodensities within normal range for patient's age, though non-specific are most compatible with chronic small vessel ischemic disease. No acute large vascular territory infarcts. No abnormal extra-axial fluid collections. Basal cisterns are patent. VASCULAR: Mild calcific atherosclerosis of the carotid siphons. SKULL: No skull fracture. Multiple dental caries. No significant scalp soft tissue swelling. SINUSES/ORBITS: Trace paranasal sinus mucosal thickening. Mastoid air cells are well aerated.The included ocular globes and orbital contents are non-suspicious. Status post RIGHT ocular lens implant. OTHER: LEFT preseptal swelling with soft tissue density. No subcutaneous gas or radiopaque foreign bodies. IMPRESSION: 1. LEFT preseptal soft tissue swelling, potential cellulitis or mass. 2. Negative non-contrast CT HEAD for age. Electronically Signed  By: Awilda Metro M.D.   On: 03/23/2018 03:23   Ct Angio Neck W Or Wo Contrast  Result Date: 03/23/2018 CLINICAL DATA:  Slurred speech. Hypertension diabetes. Left facial droop. Left-sided weakness. EXAM: CT ANGIOGRAPHY HEAD AND NECK TECHNIQUE: Multidetector CT imaging of the head  and neck was performed using the standard protocol during bolus administration of intravenous contrast. Multiplanar CT image reconstructions and MIPs were obtained to evaluate the vascular anatomy. Carotid stenosis measurements (when applicable) are obtained utilizing NASCET criteria, using the distal internal carotid diameter as the denominator. CONTRAST:  50mL ISOVUE-370 IOPAMIDOL (ISOVUE-370) INJECTION 76% COMPARISON:  MRI head 03/23/2018, CT head 03/23/2018 FINDINGS: CTA NECK FINDINGS Aortic arch: Mild atherosclerotic disease in the aortic arch without aneurysm or dissection. Four vessel aortic arch. Left vertebral artery origin from the arch. Right carotid system: Right carotid system widely patent without stenosis Left carotid system: Mild atherosclerotic disease left carotid bifurcation without significant stenosis. Vertebral arteries: Moderate stenosis distal right vertebral artery which does supply the basilar. Basilar is very small and severely diseased. Left vertebral artery origin from the arch is hypoplastic. Occlusion of the distal left vertebral artery after PICA Skeleton: Negative Other neck: Negative Upper chest: Linear soft tissue density with 5 mm calcification right upper lobe most compatible with scarring. Review of the MIP images confirms the above findings CTA HEAD FINDINGS Anterior circulation: Atherosclerotic disease in the cavernous carotid bilaterally without significant stenosis. Anterior and middle cerebral arteries patent bilaterally. Mild disease in the left middle cerebral artery. Mild to moderate stenosis distal right M1 segment. Posterior circulation: Fetal origin of the posterior cerebral artery bilaterally with hypoplastic posterior circulation. Distal left vertebral artery does not contribute to basilar which could be congenital or due to atherosclerotic disease. Moderate stenosis distal right vertebral artery. Basilar is very small and diffusely diseased with segmental occlusion  in the midportion. PICA patent bilaterally. Superior cerebellar arteries patent bilaterally. Moderately severe tandem stenoses in the left posterior cerebral artery. Mild stenosis right posterior cerebral artery. Venous sinuses: Patent Anatomic variants: None Delayed phase: Atrophy and chronic microvascular ischemic changes in the white matter. Normal enhancement. Review of the MIP images confirms the above findings IMPRESSION: Bilateral carotid and vertebral arteries are widely patent in the neck without significant stenosis. Mild atherosclerotic disease cavernous carotid artery and middle cerebral arteries bilaterally Severe disease in the posterior circulation. Fetal origin of the posterior cerebral arteries bilaterally. 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 This is of unknown duration. No acute infarct in the brainstem on MRI earlier today. Moderate stenosis left posterior cerebral artery and mild stenosis right posterior cerebral artery. These results were called by telephone at the time of interpretation on 03/23/2018 at 10:59 am to Dr. Otelia Limes, who verbally acknowledged these results. Electronically Signed   By: Marlan Palau M.D.   On: 03/23/2018 10:59   Mr Maxine Glenn Head Wo Contrast  Result Date: 03/23/2018 CLINICAL DATA:  LEFT eye droop, garbled speech for 30 minutes. Now at baseline. History of stroke, hypertension, diabetes, anticoagulation. EXAM: MRI HEAD WITHOUT CONTRAST MRA HEAD WITHOUT CONTRAST TECHNIQUE: Multiplanar, multiecho pulse sequences of the brain and surrounding structures were obtained without intravenous contrast. Angiographic images of the head were obtained using MRA technique without contrast. COMPARISON:  CT HEAD March 23, 2018. MRI head report dated June 15, 2013 though images are not available for direct comparison. FINDINGS: MRI HEAD FINDINGS-moderately motion degraded examination. INTRACRANIAL CONTENTS: No reduced diffusion  to suggest acute ischemia. No  susceptibility artifact to suggest hemorrhage. The ventricles and sulci are normal for patient's age. Patchy to confluent pontine and supratentorial white matter FLAIR T2 hyperintensities tracking into RIGHT internal capsule. Old RIGHT basal ganglia lacunar infarct. No suspicious parenchymal signal, masses, mass effect. No abnormal extra-axial fluid collections. No extra-axial masses. VASCULAR: Normal major intracranial vascular flow voids present at skull base. SKULL AND UPPER CERVICAL SPINE: No abnormal sellar expansion. No suspicious calvarial bone marrow signal. Craniocervical junction maintained. SINUSES/ORBITS: Trace paranasal sinus mucosal thickening. LEFT maxillary mucosal retention cyst. Trace LEFT mastoid effusion. The included ocular globes and orbital contents are non-suspicious. Status post RIGHT ocular lens implant. OTHER: None. MRA HEAD FINDINGS ANTERIOR CIRCULATION: Normal flow related enhancement of the included cervical, petrous, cavernous and supraclinoid internal carotid arteries. Mild luminal irregularity RIGHT supraclinoid internal carotid artery. Patent anterior communicating artery. Patent anterior and middle cerebral arteries. POSTERIOR CIRCULATION: Due to motion, vertebral arteries not well characterized. Tandem loss of basilar artery flow related enhancement. Robust bilateral posterior communicating arteries present. Patent posterior cerebral arteries, moderate stenosis seen with atherosclerosis or motion artifact. ANATOMIC VARIANTS: Aplastic RIGHT A1 segment. Source images and MIP images were reviewed. IMPRESSION: MRI HEAD: 1. No acute intracranial process on this moderately motion degraded examination. 2. Moderate to severe chronic small vessel ischemic changes. Old RIGHT basal ganglia lacunar infarct. MRA HEAD: 1. Moderately motion degraded examination. No emergent large vessel occlusion. 2. Intermittent loss of basilar artery signal concerning for stenosis  though, may be overestimated by motion artifact. Findings would be better demonstrated on CTA HEAD as clinically indicated. Electronically Signed   By: Awilda Metro M.D.   On: 03/23/2018 06:11   Mr Brain Wo Contrast  Result Date: 03/23/2018 CLINICAL DATA:  LEFT eye droop, garbled speech for 30 minutes. Now at baseline. History of stroke, hypertension, diabetes, anticoagulation. EXAM: MRI HEAD WITHOUT CONTRAST MRA HEAD WITHOUT CONTRAST TECHNIQUE: Multiplanar, multiecho pulse sequences of the brain and surrounding structures were obtained without intravenous contrast. Angiographic images of the head were obtained using MRA technique without contrast. COMPARISON:  CT HEAD March 23, 2018. MRI head report dated June 15, 2013 though images are not available for direct comparison. FINDINGS: MRI HEAD FINDINGS-moderately motion degraded examination. INTRACRANIAL CONTENTS: No reduced diffusion to suggest acute ischemia. No susceptibility artifact to suggest hemorrhage. The ventricles and sulci are normal for patient's age. Patchy to confluent pontine and supratentorial white matter FLAIR T2 hyperintensities tracking into RIGHT internal capsule. Old RIGHT basal ganglia lacunar infarct. No suspicious parenchymal signal, masses, mass effect. No abnormal extra-axial fluid collections. No extra-axial masses. VASCULAR: Normal major intracranial vascular flow voids present at skull base. SKULL AND UPPER CERVICAL SPINE: No abnormal sellar expansion. No suspicious calvarial bone marrow signal. Craniocervical junction maintained. SINUSES/ORBITS: Trace paranasal sinus mucosal thickening. LEFT maxillary mucosal retention cyst. Trace LEFT mastoid effusion. The included ocular globes and orbital contents are non-suspicious. Status post RIGHT ocular lens implant. OTHER: None. MRA HEAD FINDINGS ANTERIOR CIRCULATION: Normal flow related enhancement of the included cervical, petrous, cavernous and supraclinoid internal carotid  arteries. Mild luminal irregularity RIGHT supraclinoid internal carotid artery. Patent anterior communicating artery. Patent anterior and middle cerebral arteries. POSTERIOR CIRCULATION: Due to motion, vertebral arteries not well characterized. Tandem loss of basilar artery flow related enhancement. Robust bilateral posterior communicating arteries present. Patent posterior cerebral arteries, moderate stenosis seen with atherosclerosis or motion artifact. ANATOMIC VARIANTS: Aplastic RIGHT A1 segment. Source images and MIP images were reviewed. IMPRESSION: MRI HEAD: 1. No acute intracranial process on this moderately  motion degraded examination. 2. Moderate to severe chronic small vessel ischemic changes. Old RIGHT basal ganglia lacunar infarct. MRA HEAD: 1. Moderately motion degraded examination. No emergent large vessel occlusion. 2. Intermittent loss of basilar artery signal concerning for stenosis though, may be overestimated by motion artifact. Findings would be better demonstrated on CTA HEAD as clinically indicated. Electronically Signed   By: Awilda Metro M.D.   On: 03/23/2018 06:11      Subjective: Patient seen this morning.  Daughter at bedside.  Alert this morning and ate breakfast.  During my exam "taking a nap" but easily arousable, remained alert and oriented x3.  Followed instructions well.  Denied complaints.  Family expressed continued concern regarding fluctuating alertness, facial asymmetry.  Discharge Exam:  Vitals:   03/25/18 0328 03/25/18 0717 03/25/18 1144 03/25/18 1726  BP: (!) 180/78 (!) 159/77 (!) 151/63 (!) 152/65  Pulse: 71 72 63 (!) 58  Resp: 20 20 18 18   Temp: 98.6 F (37 C) 99.3 F (37.4 C) 98.1 F (36.7 C) 98.3 F (36.8 C)  TempSrc: Oral Axillary Oral Oral  SpO2: 94% 93% 96% 97%  Weight:      Height:        General exam: Pleasant elderly male, moderately built and obese lying comfortably propped up in bed without distress. Respiratory system: Clear to  auscultation. Respiratory effort normal. Cardiovascular system: S1 & S2 heard, RRR. No JVD, murmurs, rubs, gallops or clicks. No pedal edema.  Telemetry personally reviewed: Sinus rhythm with first-degree AV block/BBB morphology and occasional PVCs. Gastrointestinal system: Abdomen is nondistended, soft and nontender. No organomegaly or masses felt. Normal bowel sounds heard. Central nervous system: Mental status as indicated above.  Mild dysarthria.  Facial asymmetry with lean towards the right and diminished left nasolabial fold, better than yesterday. Extremities: 4+ by 5 power in right upper extremity, 4 x 5 power in left upper extremity, 1 x 5 power in left lower extremity and 2 x 5 power in right lower extremity. Skin: No rashes, lesions or ulcers Psychiatry: Judgement and insight impaired. Mood & affect , appropriate    The results of significant diagnostics from this hospitalization (including imaging, microbiology, ancillary and laboratory) are listed below for reference.     Labs: CBC: Recent Labs  Lab 03/23/18 0317  WBC 7.9  NEUTROABS 4.6  HGB 11.6*  HCT 36.4*  MCV 81.3  PLT 185   Basic Metabolic Panel: Recent Labs  Lab 03/23/18 0317  NA 139  K 3.8  CL 106  CO2 26  GLUCOSE 217*  BUN 14  CREATININE 0.94  CALCIUM 9.7   Liver Function Tests: Recent Labs  Lab 03/23/18 0317  AST 18  ALT 12  ALKPHOS 57  BILITOT 0.7  PROT 6.6  ALBUMIN 3.5    CBG: Recent Labs  Lab 03/24/18 1158 03/24/18 1715 03/24/18 2314 03/25/18 1141 03/25/18 1647  GLUCAP 205* 265* 232* 246* 241*   Hgb A1c Recent Labs    03/24/18 0333  HGBA1C 9.0*   Lipid Profile Recent Labs    03/24/18 0333  CHOL 246*  HDL 53  LDLCALC 171*  TRIG 108  CHOLHDL 4.6   Urinalysis    Component Value Date/Time   COLORURINE COLORLESS (A) 03/23/2018 0245   APPEARANCEUR CLEAR 03/23/2018 0245   LABSPEC 1.002 (L) 03/23/2018 0245   PHURINE 7.0 03/23/2018 0245   GLUCOSEU NEGATIVE 03/23/2018  0245   HGBUR SMALL (A) 03/23/2018 0245   BILIRUBINUR NEGATIVE 03/23/2018 0245   KETONESUR NEGATIVE 03/23/2018  0245   PROTEINUR NEGATIVE 03/23/2018 0245   UROBILINOGEN 0.2 12/13/2014 2011   NITRITE NEGATIVE 03/23/2018 0245   LEUKOCYTESUR NEGATIVE 03/23/2018 0245      Time coordinating discharge: 40 minutes  SIGNED:  Marcellus Scott, MD, FACP, [facility]. [facility] Pager [phone]  If 7PM-7AM, please contact night-coverage [email or web address] Password [facility] 03/25/2018, 7:19 PM

## 2018-03-25 NOTE — Progress Notes (Signed)
OT Cancellation Note  Patient Details Name: Tom Wang MRN: [id number] DOB: June 29, 1925   Cancelled Treatment:    Reason Eval/Treat Not Completed: Other (comment). Spoke with pt family in room and they reported that pt normally can brush his teeth by himself and somewhat help wash his UB, but otherwise they A with all grooming, B/D, toileting. Pt is not at baseline yet from ADL standpoint, but pt is to D/C home today and HHPT and HHaide have been recommended by PT so if pt needs OT they can facilliate getting order. Did notice that pt's Bil hands were swollen so spoke with family about making sure his arms are propped so that hands are higher than elbows. Pt's family also concerned that he is getting ready to leave by PTAR and want to make sure he is clean for transport home--made NT aware. Eval not completed, we will sign off.  Evette GeorgesLeonard, Trinaty Bundrick Eva [phone] 03/25/2018, 4:30 PM

## 2018-03-25 NOTE — Progress Notes (Signed)
STROKE TEAM PROGRESS NOTE    INTERVAL HISTORY His daughter and granddaughter are at the bedside.  Pt initially sleeping when I was talking with daughter and granddaughter, but on exam pt woke up and AAOx3. He still has left facial droop and left sided hemiparesis, but likely chronic with recrudescence. MRI no acute infarct.   Vitals:   03/24/18 2315 03/25/18 0328 03/25/18 0717 03/25/18 1144  BP: (!) 173/75 (!) 180/78 (!) 159/77 (!) (P) 151/63  Pulse: 60 71 72 (P) 63  Resp: 17 20 20  (P) 18  Temp: 98.1 F (36.7 C) 98.6 F (37 C) 99.3 F (37.4 C) (P) 98.1 F (36.7 C)  TempSrc: Oral Oral Axillary (P) Oral  SpO2: 98% 94% 93% (P) 96%  Weight:      Height:        CBC:  Recent Labs  Lab 03/23/18 0317  WBC 7.9  NEUTROABS 4.6  HGB 11.6*  HCT 36.4*  MCV 81.3  PLT 185    Basic Metabolic Panel:  Recent Labs  Lab 03/23/18 0317  NA 139  K 3.8  CL 106  CO2 26  GLUCOSE 217*  BUN 14  CREATININE 0.94  CALCIUM 9.7   Lipid Panel:     Component Value Date/Time   CHOL 246 (H) 03/24/2018 0333   TRIG 108 03/24/2018 0333   HDL 53 03/24/2018 0333   CHOLHDL 4.6 03/24/2018 0333   VLDL 22 03/24/2018 0333   LDLCALC 171 (H) 03/24/2018 0333   HgbA1c:  Lab Results  Component Value Date   HGBA1C 9.0 (H) 03/24/2018   Urine Drug Screen:     Component Value Date/Time   LABOPIA NONE DETECTED 03/23/2018 0245   COCAINSCRNUR NONE DETECTED 03/23/2018 0245   LABBENZ NONE DETECTED 03/23/2018 0245   AMPHETMU NONE DETECTED 03/23/2018 0245   THCU NONE DETECTED 03/23/2018 0245   LABBARB NONE DETECTED 03/23/2018 0245    Alcohol Level     Component Value Date/Time   ETH <10 03/23/2018 0317     IMAGING   Ct Angio Head W Or Wo Contrast Ct Angio Neck W Or Wo Contrast 03/23/2018 IMPRESSION:  Bilateral carotid and vertebral arteries are widely patent in the neck without significant stenosis. Mild atherosclerotic disease cavernous carotid artery and middle cerebral arteries bilaterally  Severe disease in the posterior circulation. Fetal origin of the posterior cerebral arteries bilaterally. 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 This is of unknown duration. No acute infarct in the brainstem on MRI earlier today. Moderate stenosis left posterior cerebral artery and mild stenosis right posterior cerebral artery.   Ct Head Wo Contrast 03/24/2018 IMPRESSION:  No acute intracranial abnormality. Stable chronic microvascular ischemic changes and volume loss of the brain.   Ct Head Wo Contrast 03/23/2018 IMPRESSION:  1. LEFT preseptal soft tissue swelling, potential cellulitis or mass.  2. Negative non-contrast CT HEAD for age.   Mr Maxine GlennMra Head Wo Contrast 03/23/2018 IMPRESSION:   MRI HEAD:  1. No acute intracranial process on this moderately motion degraded examination.  2. Moderate to severe chronic small vessel ischemic changes. Old RIGHT basal ganglia lacunar infarct.   MRA HEAD:  1. Moderately motion degraded examination. No emergent large vessel occlusion.  2. Intermittent loss of basilar artery signal concerning for stenosis though, may be overestimated by motion artifact. Findings would be better demonstrated on CTA HEAD as clinically indicated.    Dg Chest 2 View 03/23/2018 IMPRESSION:  1. No acute findings.  2.  Aortic Atherosclerosis (ICD10-I70.0).    Carotid Doppler   There is 1-39% bilateral ICA stenosis. Vertebral artery flow is antegrade.   2D Echocardiogram  - Left ventricle: The cavity size was normal. Wall thickness was increased in a pattern of moderate LVH. There was mild focal basal hypertrophy of the septum. Systolic function was normal. The estimated ejection fraction was in the range of 55% to 60%. Doppler parameters are consistent with abnormal left ventricular relaxation (grade 1 diastolic dysfunction). - Mitral valve: Mildly calcified annulus. - Left atrium: The atrium was mildly  dilated. Impressions:  - No cardiac source of embolism was identified, but cannot be ruled out on the basis of this examination.   PHYSICAL EXAM Frail elderly   African-American male not in distress. Afebrile. Head is nontraumatic. Neck is supple without bruit.    Cardiac exam no murmur or gallop. Lungs are clear to auscultation. Distal pulses are well felt. Neurological Exam :  Sleepy but easily arousable, orientated to place, people and month and age. Told me year "2020". Follows all central and peripheral simple commands. Mild to moderate dysarthria but can be understood. Extraocular movements appear full range. Blinks to threat bilaterally, visual field full with confrontation. Left eyelid swelling, and decreased eye closure strength. Left facial droop. Tongue midline. Motor system exam left UE 4/5 proximal with drift, RUE 5/5. BLE proximal 1/5. LLE knee flexion 3/5, DF/PF 3/5, RLE knee flexion 4/5, DF/PF 4+/5. Deep tendon reflexes are symmetric 1+. Plantars are downgoing. Sensation appears intact. Coordination b/l UEs slow but no ataxia seen. Gait not tested.   ASSESSMENT/PLAN Tom Wang is a 84 y.o. male with history of DVT on Eliquis, stroke affecting his left side in 2014 with resultant left facial droop, left arm weakness, bilateral weakness requiring wheelchair bound status, HTN, diabetes presenting with increased slurred speech.   Encephalopathy vs. Sleep-wake cycle disturbance   Sleepy at rest but easily arousable and following all commands, AAO x 3 on woke up  UA negative for UTI  CXR no acute finding  Afebrile  WBC 7.9  Electrolyte WNL  Renal and liver function WNL  Recommend to correct sleep wake cycle disturbance  recrudescence of old stroke  CT head left preseptal soft tissue swelling potential cellulitis or mass.    MRI no acute stroke.  Old right basal ganglia lacune.  MRA no LVO.  Loss of basilar artery signal concerning for stenosis   CTA head &  neck mild atherosclerosis.  Severe posterior circulation disease.    Carotid Doppler no ICA stenosis.  Vertebral artery antegrade  2D Echo EF 55 to 6%.  No SOE  LDL 171  HgbA1c 9.0  Eliquis for VTE prophylaxis  Eliquis (apixaban) daily prior to admission, now on Eliquis (apixaban) daily.  Continue Eliquis at discharge. However, pt needs to follow up with PCP to decide on correct dose of eliquis. According to weight and renal function, he should be on eliquis 5mg  bid.  Therapy recommendations: Home health PT.  Supervision.  Home health aide.  Disposition:  Return home planned  DVT left lower extremity  On Eliquis 2.5 mg twice daily   Continue Eliquis at discharge  However, pt needs to follow up with PCP to decide on correct dose of eliquis. According to weight and renal function, he should be on eliquis 5mg  bid.  Hx stroke/TIA  2014 - right basal ganglia infarct with resultant left hemiparesis - due to combination of severe LBP - pt was bedbound at home  Hypertension  Stable on the high side  Resume home meds . BP goal normotensive . Avoid hypotension  Hyperlipidemia  Home meds: Zocor 20, resumed in hospital  LDL 171, goal < 70  Continue statin at discharge  Follow up with PCP  Diabetes type II  HgbA1c 9.0, goal < 7.0  Uncontrolled  Hyperglycemia with fluctuation  SSI  CBG monitoring  Avoid hypoglycemia  Other Stroke Risk Factors  Advanced age  Obesity, Body mass index is 37.77 kg/m., recommend weight loss, diet and exercise as appropriate   Other Active Problems  Left preseptal soft tissue swelling.    Hospital day # 1  Neurology will sign off. Please call with questions. No neuro follow up needed at this time. Thanks for the consult.  Marvel Plan, MD PhD Stroke Neurology 03/25/2018 2:41 PM  I spent  35 minutes in total face-to-face time with the patient, more than 50% of which was spent in counseling and coordination of care,  reviewing test results, images and medication, and discussing the diagnosis of encephalopathy, sleep wake cycle disturbance, DVT on [facility], treatment plan and potential prognosis. This patient's care requiresreview of multiple databases, neurological assessment, discussion with family, other specialists and medical decision making of high complexity. I had long discussion with daughter and granddaughter at bedside, updated pt current condition, treatment plan and potential prognosis. They expressed understanding and appreciation. I also discussed with Dr. Waymon Amato over the phone extensively.         To contact Stroke Continuity provider, please refer to [email or web address]. After hours, contact General Neurology

## 2018-06-14 ENCOUNTER — Inpatient Hospital Stay (HOSPITAL_COMMUNITY)
Admission: EM | Admit: 2018-06-14 | Discharge: 2018-06-17 | DRG: 065 | Disposition: A | Discharge status: Home Health | Payer: Medicare HMO | Attending: Internal Medicine | Admitting: Internal Medicine

## 2018-06-14 ENCOUNTER — Encounter (HOSPITAL_COMMUNITY): Payer: Self-pay

## 2018-06-14 ENCOUNTER — Emergency Department (HOSPITAL_COMMUNITY): Payer: Medicare HMO

## 2018-06-14 ENCOUNTER — Other Ambulatory Visit: Payer: Self-pay

## 2018-06-14 DIAGNOSIS — R29710 NIHSS score 10: Secondary | ICD-10-CM | POA: Diagnosis not present

## 2018-06-14 DIAGNOSIS — Z8673 Personal history of transient ischemic attack (TIA), and cerebral infarction without residual deficits: Secondary | ICD-10-CM

## 2018-06-14 DIAGNOSIS — Z880 Allergy status to penicillin: Secondary | ICD-10-CM | POA: Diagnosis not present

## 2018-06-14 DIAGNOSIS — Z794 Long term (current) use of insulin: Secondary | ICD-10-CM

## 2018-06-14 DIAGNOSIS — K219 Gastro-esophageal reflux disease without esophagitis: Secondary | ICD-10-CM | POA: Diagnosis present

## 2018-06-14 DIAGNOSIS — R29712 NIHSS score 12: Secondary | ICD-10-CM | POA: Diagnosis present

## 2018-06-14 DIAGNOSIS — Z885 Allergy status to narcotic agent status: Secondary | ICD-10-CM

## 2018-06-14 DIAGNOSIS — E119 Type 2 diabetes mellitus without complications: Secondary | ICD-10-CM

## 2018-06-14 DIAGNOSIS — R29707 NIHSS score 7: Secondary | ICD-10-CM | POA: Diagnosis not present

## 2018-06-14 DIAGNOSIS — Z888 Allergy status to other drugs, medicaments and biological substances status: Secondary | ICD-10-CM

## 2018-06-14 DIAGNOSIS — L97529 Non-pressure chronic ulcer of other part of left foot with unspecified severity: Secondary | ICD-10-CM | POA: Diagnosis present

## 2018-06-14 DIAGNOSIS — E785 Hyperlipidemia, unspecified: Secondary | ICD-10-CM | POA: Diagnosis present

## 2018-06-14 DIAGNOSIS — E11621 Type 2 diabetes mellitus with foot ulcer: Secondary | ICD-10-CM | POA: Diagnosis present

## 2018-06-14 DIAGNOSIS — Z86718 Personal history of other venous thrombosis and embolism: Secondary | ICD-10-CM | POA: Diagnosis not present

## 2018-06-14 DIAGNOSIS — R4701 Aphasia: Secondary | ICD-10-CM | POA: Diagnosis present

## 2018-06-14 DIAGNOSIS — M19072 Primary osteoarthritis, left ankle and foot: Secondary | ICD-10-CM | POA: Diagnosis present

## 2018-06-14 DIAGNOSIS — R531 Weakness: Secondary | ICD-10-CM

## 2018-06-14 DIAGNOSIS — I6789 Other cerebrovascular disease: Secondary | ICD-10-CM | POA: Diagnosis not present

## 2018-06-14 DIAGNOSIS — M19071 Primary osteoarthritis, right ankle and foot: Secondary | ICD-10-CM | POA: Diagnosis present

## 2018-06-14 DIAGNOSIS — I69354 Hemiplegia and hemiparesis following cerebral infarction affecting left non-dominant side: Secondary | ICD-10-CM

## 2018-06-14 DIAGNOSIS — I10 Essential (primary) hypertension: Secondary | ICD-10-CM | POA: Diagnosis present

## 2018-06-14 DIAGNOSIS — Z7901 Long term (current) use of anticoagulants: Secondary | ICD-10-CM

## 2018-06-14 DIAGNOSIS — Z79899 Other long term (current) drug therapy: Secondary | ICD-10-CM | POA: Diagnosis not present

## 2018-06-14 DIAGNOSIS — E1149 Type 2 diabetes mellitus with other diabetic neurological complication: Secondary | ICD-10-CM | POA: Diagnosis not present

## 2018-06-14 DIAGNOSIS — I639 Cerebral infarction, unspecified: Secondary | ICD-10-CM | POA: Diagnosis present

## 2018-06-14 LAB — CBC
HEMATOCRIT: 39.7 % (ref 39.0–52.0)
Hemoglobin: 12.4 g/dL — ABNORMAL LOW (ref 13.0–17.0)
MCH: 25.6 pg — ABNORMAL LOW (ref 26.0–34.0)
MCHC: 31.2 g/dL (ref 30.0–36.0)
MCV: 81.9 fL (ref 80.0–100.0)
NRBC: 0 % (ref 0.0–0.2)
Platelets: 200 10*3/uL (ref 150–400)
RBC: 4.85 MIL/uL (ref 4.22–5.81)
RDW: 15.6 % — AB (ref 11.5–15.5)
WBC: 9.3 10*3/uL (ref 4.0–10.5)

## 2018-06-14 LAB — DIFFERENTIAL
Abs Immature Granulocytes: 0.03 10*3/uL (ref 0.00–0.07)
BASOS ABS: 0 10*3/uL (ref 0.0–0.1)
BASOS PCT: 0 %
EOS PCT: 1 %
Eosinophils Absolute: 0.1 10*3/uL (ref 0.0–0.5)
IMMATURE GRANULOCYTES: 0 %
LYMPHS PCT: 19 %
Lymphs Abs: 1.7 10*3/uL (ref 0.7–4.0)
Monocytes Absolute: 1 10*3/uL (ref 0.1–1.0)
Monocytes Relative: 11 %
Neutro Abs: 6.3 10*3/uL (ref 1.7–7.7)
Neutrophils Relative %: 69 %

## 2018-06-14 LAB — GLUCOSE, CAPILLARY: Glucose-Capillary: 233 mg/dL — ABNORMAL HIGH (ref 70–99)

## 2018-06-14 LAB — URINALYSIS, ROUTINE W REFLEX MICROSCOPIC
Bilirubin Urine: NEGATIVE
Glucose, UA: 150 mg/dL — AB
HGB URINE DIPSTICK: NEGATIVE
Ketones, ur: NEGATIVE mg/dL
Leukocytes, UA: NEGATIVE
Nitrite: NEGATIVE
Protein, ur: 100 mg/dL — AB
SPECIFIC GRAVITY, URINE: 1.01 (ref 1.005–1.030)
pH: 5 (ref 5.0–8.0)

## 2018-06-14 LAB — COMPREHENSIVE METABOLIC PANEL
ALBUMIN: 3.4 g/dL — AB (ref 3.5–5.0)
ALK PHOS: 59 U/L (ref 38–126)
ALT: 13 U/L (ref 0–44)
AST: 15 U/L (ref 15–41)
Anion gap: 9 (ref 5–15)
BUN: 14 mg/dL (ref 8–23)
CHLORIDE: 103 mmol/L (ref 98–111)
CO2: 24 mmol/L (ref 22–32)
CREATININE: 1.05 mg/dL (ref 0.61–1.24)
Calcium: 9.6 mg/dL (ref 8.9–10.3)
GFR calc Af Amer: >60 mL/min (ref >60)
GFR calc non Af Amer: >60 mL/min (ref >60)
GLUCOSE: 291 mg/dL — AB (ref 70–99)
Potassium: 4 mmol/L (ref 3.5–5.1)
SODIUM: 136 mmol/L (ref 135–145)
Total Bilirubin: 0.5 mg/dL (ref 0.3–1.2)
Total Protein: 7.1 g/dL (ref 6.5–8.1)

## 2018-06-14 LAB — I-STAT CHEM 8, ED
BUN: 15 mg/dL (ref 8–23)
CALCIUM ION: 1.21 mmol/L (ref 1.15–1.40)
CHLORIDE: 102 mmol/L (ref 98–111)
Creatinine, Ser: 1.1 mg/dL (ref 0.61–1.24)
GLUCOSE: 286 mg/dL — AB (ref 70–99)
HCT: 40 % (ref 39.0–52.0)
Hemoglobin: 13.6 g/dL (ref 13.0–17.0)
Potassium: 4 mmol/L (ref 3.5–5.1)
Sodium: 138 mmol/L (ref 135–145)
TCO2: 28 mmol/L (ref 22–32)

## 2018-06-14 LAB — I-STAT TROPONIN, ED: Troponin i, poc: 0.01 ng/mL (ref 0.00–0.08)

## 2018-06-14 LAB — RAPID URINE DRUG SCREEN, HOSP PERFORMED
Amphetamines: NOT DETECTED
BENZODIAZEPINES: NOT DETECTED
Barbiturates: NOT DETECTED
Cocaine: NOT DETECTED
Opiates: NOT DETECTED
TETRAHYDROCANNABINOL: NOT DETECTED

## 2018-06-14 LAB — APTT: aPTT: 38 seconds — ABNORMAL HIGH (ref 24–36)

## 2018-06-14 LAB — ETHANOL: Alcohol, Ethyl (B): <10 mg/dL (ref <10)

## 2018-06-14 LAB — PROTIME-INR
INR: 1
PROTHROMBIN TIME: 13.1 s (ref 11.4–15.2)

## 2018-06-14 LAB — CBG MONITORING, ED: GLUCOSE-CAPILLARY: 262 mg/dL — AB (ref 70–99)

## 2018-06-14 MED ORDER — GLIPIZIDE 5 MG PO TABS
10.0000 mg | ORAL_TABLET | Freq: Two times a day (BID) | ORAL | Status: DC
  Administered 2018-06-15 – 2018-06-17 (×5): 10 mg via ORAL
  Filled 2018-06-14 (×5): qty 2

## 2018-06-14 MED ORDER — STROKE: EARLY STAGES OF RECOVERY BOOK
Freq: Once | Status: AC
  Administered 2018-06-14: 22:00:00
  Filled 2018-06-14: qty 1

## 2018-06-14 MED ORDER — SENNOSIDES-DOCUSATE SODIUM 8.6-50 MG PO TABS: 1.0000 | ORAL_TABLET | Freq: Every evening | ORAL | Status: DC | PRN

## 2018-06-14 MED ORDER — APIXABAN 2.5 MG PO TABS
2.5000 mg | ORAL_TABLET | Freq: Two times a day (BID) | ORAL | Status: DC
  Administered 2018-06-14 – 2018-06-16 (×4): 2.5 mg via ORAL
  Filled 2018-06-14 (×4): qty 1

## 2018-06-14 MED ORDER — GLIPIZIDE 10 MG PO TABS
10.0000 mg | ORAL_TABLET | ORAL | Status: AC
  Administered 2018-06-14: 10 mg via ORAL
  Filled 2018-06-14: qty 1

## 2018-06-14 MED ORDER — PANTOPRAZOLE SODIUM 40 MG PO TBEC
40.0000 mg | DELAYED_RELEASE_TABLET | Freq: Every day | ORAL | Status: DC
  Administered 2018-06-14 – 2018-06-17 (×4): 40 mg via ORAL
  Filled 2018-06-14 (×4): qty 1

## 2018-06-14 MED ORDER — ACETAMINOPHEN 160 MG/5ML PO SOLN: 650.0000 mg | ORAL | Status: DC | PRN

## 2018-06-14 MED ORDER — ACETAMINOPHEN 650 MG RE SUPP: 650.0000 mg | RECTAL | Status: DC | PRN

## 2018-06-14 MED ORDER — ACETAMINOPHEN 325 MG PO TABS: 650.0000 mg | ORAL_TABLET | ORAL | Status: DC | PRN

## 2018-06-14 MED ORDER — SODIUM CHLORIDE 0.9 % IV SOLN
INTRAVENOUS | Status: DC
  Administered 2018-06-14 – 2018-06-16 (×5): via INTRAVENOUS

## 2018-06-14 MED ORDER — SENNOSIDES-DOCUSATE SODIUM 8.6-50 MG PO TABS
2.0000 | ORAL_TABLET | Freq: Every day | ORAL | Status: DC
  Administered 2018-06-14 – 2018-06-17 (×4): 2 via ORAL
  Filled 2018-06-14 (×5): qty 2

## 2018-06-14 MED ORDER — VITAMIN D 25 MCG (1000 UNIT) PO TABS
500.0000 [IU] | ORAL_TABLET | Freq: Every day | ORAL | Status: DC
  Administered 2018-06-14 – 2018-06-16 (×3): 500 [IU] via ORAL
  Filled 2018-06-14 (×3): qty 1

## 2018-06-14 MED ORDER — ACETAMINOPHEN 500 MG PO TABS
500.0000 mg | ORAL_TABLET | Freq: Every day | ORAL | Status: DC
  Administered 2018-06-14 – 2018-06-17 (×4): 500 mg via ORAL
  Filled 2018-06-14 (×4): qty 1

## 2018-06-14 MED ORDER — TURMERIC CURCUMIN PO CAPS: 1.0000 | ORAL_CAPSULE | Freq: Every day | ORAL | Status: DC

## 2018-06-14 MED ORDER — INSULIN ASPART 100 UNIT/ML ~~LOC~~ SOLN: 0.0000 [IU] | Freq: Three times a day (TID) | SUBCUTANEOUS | Status: DC

## 2018-06-14 NOTE — Progress Notes (Signed)
PHARMACIST - PHYSICIAN ORDER COMMUNICATION  CONCERNING: P&T Medication Policy on Herbal Medications  DESCRIPTION:  This patient's order for:  Tumeric  has been noted.  This product(s) is classified as an "herbal" or natural product. Due to a lack of definitive safety studies or FDA approval, nonstandard manufacturing practices, plus the potential risk of unknown drug-drug interactions while on inpatient medications, the Pharmacy and Therapeutics Committee does not permit the use of "herbal" or natural products of this type within [place].   ACTION TAKEN: The pharmacy department is unable to verify this order at this time and your patient has been informed of this safety policy. Please reevaluate patient's clinical condition at discharge and address if the herbal or natural product(s) should be resumed at that time.   Sherol Sabas A. Mirah Nevins, PharmD, BCPS Clinical Pharmacist [place] Pager: [phone] Please utilize Amion for appropriate phone number to reach the unit pharmacist ([facility])   

## 2018-06-14 NOTE — ED Provider Notes (Signed)
[facility] Emergency Department Provider Note MRN:  [id number]  Arrival date & time: 06/14/18     Chief Complaint   Aphasia History of Present Illness   Tom Wang is a 84 y.o. year-old male with a history of stroke presenting to the ED with chief complaint of aphasia  Per family, it was noticed that it 2:30 PM today the patient was having trouble writing with his right hand.  At 4 PM, his symptoms progressed and he was at that time unable to speak.  He has a history of stroke with residual left-sided weakness.  The weakness today was in his right arm.  He is normally fully conversant but has not been able to speak normally since 4 PM.  I was unable to obtain an accurate HPI, PMH, or ROS due to the patient's aphasia.  Review of Systems  Positive for aphasia, agraphia, right-sided weakness.  Patient's Health History    Past Medical History:  Diagnosis Date  . Arthritis    ankles  . Diabetes mellitus    oral meds only  . GERD (gastroesophageal reflux disease)   . Hypertension   . Pneumonia    20 years ago .  Marland Kitchen Stroke [facility])     Past Surgical History:  Procedure Laterality Date  . CATARACT EXTRACTION W/PHACO  04/26/2012   Procedure: CATARACT EXTRACTION PHACO AND INTRAOCULAR LENS PLACEMENT (IOC);  Surgeon: Chalmers Guest, MD;  Location: [facility] OR;  Service: Ophthalmology;  Laterality: Right;  . TONSILLECTOMY    . TUMOR EXCISION     60 years ago .. right forearm .     Family History  Problem Relation Age of Onset  . Other Grandchild     Social History   Socioeconomic History  . Marital status: Widowed    Spouse name: Not on file  . Number of children: Not on file  . Years of education: Not on file  . Highest education level: Not on file  Occupational History  . Not on file  Social Needs  . Financial resource strain: Not on file  . Food insecurity:    Worry: Not on file    Inability: Not on file  . Transportation needs:    Medical: Not on file    Non-medical: Not on file  Tobacco Use  . Smoking status: Never Smoker  . Smokeless tobacco: Never Used  Substance and Sexual Activity  . Alcohol use: No  . Drug use: No  . Sexual activity: Never  Lifestyle  . Physical activity:    Days per week: Not on file    Minutes per session: Not on file  . Stress: Not on file  Relationships  . Social connections:    Talks on phone: Not on file    Gets together: Not on file    Attends religious service: Not on file    Active member of club or organization: Not on file    Attends meetings of clubs or organizations: Not on file    Relationship status: Not on file  . Intimate partner violence:    Fear of current or ex partner: Not on file    Emotionally abused: Not on file    Physically abused: Not on file    Forced sexual activity: Not on file  Other Topics Concern  . Not on file  Social History Narrative  . Not on file     Physical Exam  Vital Signs and Nursing Notes reviewed Vitals:   06/14/18 2111  06/14/18 2200  BP: (!) 186/72 (!) 180/72  Pulse: (!) 56   Resp: 18 18  Temp: 97.9 F (36.6 C) 97.9 F (36.6 C)  SpO2: 99% 98%    CONSTITUTIONAL: Chronically ill-appearing, NAD NEURO: Alert, slurred speech, oriented to name, largely aphasic, left-sided facial droop EYES:  eyes equal and reactive ENT/NECK:  no LAD, no JVD CARDIO: Regular rate, well-perfused, normal S1 and S2 PULM:  CTAB no wheezing or rhonchi GI/GU:  normal bowel sounds, non-distended, non-tender MSK/SPINE:  No gross deformities, no edema SKIN:  no rash, atraumatic PSYCH:  Appropriate speech and behavior  Diagnostic and Interventional Summary    EKG Interpretation  Date/Time:  Wednesday June 14 2018 17:15:24 EST Ventricular Rate:  54 PR Interval:    QRS Duration: 121 QT Interval:  443 QTC Calculation: 420 R Axis:   -43 Text Interpretation:  Atrial fibrillation Left bundle branch block Confirmed by Kennis Carina [phone]) on 06/14/2018 11:52:38 PM       Labs Reviewed  APTT - Abnormal; Notable for the following components:      Result Value   aPTT 38 (*)    All other components within normal limits  CBC - Abnormal; Notable for the following components:   Hemoglobin 12.4 (*)    MCH 25.6 (*)    RDW 15.6 (*)    All other components within normal limits  COMPREHENSIVE METABOLIC PANEL - Abnormal; Notable for the following components:   Glucose, Bld 291 (*)    Albumin 3.4 (*)    All other components within normal limits  URINALYSIS, ROUTINE W REFLEX MICROSCOPIC - Abnormal; Notable for the following components:   Glucose, UA 150 (*)    Protein, ur 100 (*)    Bacteria, UA RARE (*)    All other components within normal limits  GLUCOSE, CAPILLARY - Abnormal; Notable for the following components:   Glucose-Capillary 233 (*)    All other components within normal limits  I-STAT CHEM 8, ED - Abnormal; Notable for the following components:   Glucose, Bld 286 (*)    All other components within normal limits  CBG MONITORING, ED - Abnormal; Notable for the following components:   Glucose-Capillary 262 (*)    All other components within normal limits  ETHANOL  PROTIME-INR  DIFFERENTIAL  RAPID URINE DRUG SCREEN, HOSP PERFORMED  HEMOGLOBIN A1C  LIPID PANEL  I-STAT TROPONIN, ED    DG Chest 2 View  Final Result    CT HEAD CODE STROKE WO CONTRAST  Final Result    VAS US CAROTID (at [facility] and [facility] only)    (Results Pending)  MR BRAIN WO CONTRAST    (Results Pending)    Medications  cholecalciferol (VITAMIN D3) tablet 500 Units (500 Units Oral Given 06/14/18 2220)  apixaban (ELIQUIS) tablet 2.5 mg (2.5 mg Oral Given 06/14/18 2220)  pantoprazole (PROTONIX) EC tablet 40 mg (40 mg Oral Given 06/14/18 2226)  glipiZIDE (GLUCOTROL) tablet 10 mg (has no administration in time range)  acetaminophen (TYLENOL) tablet 500 mg (500 mg Oral Given 06/14/18 2225)  senna-docusate (Senokot-S) tablet 2 tablet (2 tablets Oral Given 06/14/18 2225)  0.9 %  sodium  chloride infusion ( Intravenous New Bag/Given 06/14/18 2218)  acetaminophen (TYLENOL) tablet 650 mg (has no administration in time range)    Or  acetaminophen (TYLENOL) solution 650 mg (has no administration in time range)    Or  acetaminophen (TYLENOL) suppository 650 mg (has no administration in time range)  senna-docusate (Senokot-S) tablet 1 tablet (has  no administration in time range)  insulin aspart (novoLOG) injection 0-9 Units (has no administration in time range)  glipiZIDE (GLUCOTROL) tablet 10 mg (10 mg Oral Given 06/14/18 2014)   stroke: mapping our early stages of recovery book ( Does not apply Given 06/14/18 2216)     Procedures Critical Care Critical Care Documentation Critical care time provided by me (excluding procedures): 39 minutes  Condition necessitating critical care: Concern for acute ischemic stroke  Components of critical care management: reviewing of prior records, laboratory and imaging interpretation, frequent re-examination and reassessment of vital signs, discussion with consulting services.    ED Course and Medical Decision Making  I have reviewed the triage vital signs and the nursing notes.  Pertinent labs & imaging results that were available during my care of the patient were reviewed by me and considered in my medical decision making (see below for details).  Code stroke alert initiated given last known normal 3 hours ago, imaging pending.  Noncon CT of the head unremarkable.  Evaluated by neurology, who is concerned for acute stroke, but patient is not a candidate for TPA given his anticoagulant use.  Neurology recommending no further imaging, admission to hospital service.  Admitted to hospital service for further care and evaluation.  Elmer SowMichael M. Pilar PlateBero, MD [facility] [email or web address]  Final Clinical Impressions(s) / ED Diagnoses     ICD-10-CM   1. Acute ischemic stroke (HCC) I63.9   2.  Weakness R53.1 DG Chest 2 View    DG Chest 2 View    ED Discharge Orders    None         Sabas SousBero, Salvatrice Morandi M, MD 06/14/18 2354

## 2018-06-14 NOTE — H&P (Signed)
History and Physical   Tom Wang ZOX:[id number] DOB: Aug 26, 1924 DOA: 06/14/2018  Referring MD/NP/PA: Dr. Pilar Plate  PCP: [name]   Outpatient Specialists: None  Patient coming from: Home  Chief Complaint: Abnormal speech and weakness  HPI: Tom Wang is a 84 y.o. male with medical history significant of diabetes, hypertension, recent Bell's palsy, GERD, previous CVAs who was brought in by family today due to sudden onset of abnormal speech and aphasia with confusion.  Symptoms occurred around 2:30 in the afternoon.  Patient was apparently coming from his ophthalmologist where he had some eyedrops in his eyes.  Family initially thought it was reaction to the medications.  He was unable to identify wife and daughter.  They brought him to the ER where his speech returned spontaneously around 4:00.  He has been evaluated in the ER and by neurology.  Suspected recurrent CVA however this could also relate to some infection or drug reaction.  Patient is currently awake and communicative but is weak and drowsy.  He is being admitted for CVA work-up..  ED Course: Temperature is 97.6 blood pressure 186/82 pulse 50 respiratory of 19 oxygen sat 96% room air.  His glucose is 286 otherwise chemistry appears to be normal.  He had an echocardiogram in August of this year showing EF of 55 to 60%.  Urinalysis today is negative for evidence of infection.  Toxicology was also negative.  Chest x-ray showed no evidence of acute findings. CT head acute stroke Showed no acute hemorrhage.  Patient is being admitted for CVA work-up. Review of Systems: As per HPI otherwise 10 point review of systems negative.    Past Medical History:  Diagnosis Date  . Arthritis    ankles  . Diabetes mellitus    oral meds only  . GERD (gastroesophageal reflux disease)   . Hypertension   . Pneumonia    20 years ago .  [facility]     Past Surgical History:  Procedure Laterality Date  . CATARACT  EXTRACTION W/PHACO  04/26/2012   Procedure: CATARACT EXTRACTION PHACO AND INTRAOCULAR LENS PLACEMENT (IOC);  Surgeon: Chalmers Guest, MD;  Location: [facility] - Closed OR;  Service: Ophthalmology;  Laterality: Right;  . TONSILLECTOMY    . TUMOR EXCISION     60 years ago .. right forearm .      reports that he has never smoked. He has never used smokeless tobacco. He reports that he does not drink alcohol or use drugs.  Allergies  Allergen Reactions  . Morphine And Related Other (See Comments)    SEVERE REACTION  . Other Other (See Comments)    NO NARCOTICS PER FAMILY  . Gabapentin Other (See Comments)    AGITATION, COMBATIVE  . Lantus [Insulin Glargine] Other (See Comments)    unknown  . Penicillins Other (See Comments)    UNKNOWN    Family History  Problem Relation Age of Onset  . Other Grandchild      Prior to Admission medications   Medication Sig Start Date End Date Taking? Authorizing Provider  apixaban (ELIQUIS) 2.5 MG TABS tablet Take 2.5 mg by mouth 2 (two) times daily.   Yes [provider]  cholecalciferol (VITAMIN D) 1000 UNITS tablet Take 500 Units by mouth at bedtime.    Yes [provider]  glipiZIDE (GLUCOTROL) 10 MG tablet Take 10 mg by mouth 2 (two) times daily before a meal.   Yes [provider]  Misc Natural Products (TURMERIC CURCUMIN) CAPS Take 1 capsule  by mouth daily.    Yes [provider]  omeprazole (PRILOSEC) 20 MG capsule Take 20 mg by mouth daily.   Yes [provider]  senna-docusate (SENOKOT-S) 8.6-50 MG tablet Take 2 tablets by mouth daily.   Yes [provider]  acetaminophen (TYLENOL) 500 MG tablet Take 500 mg by mouth daily.     [provider]    Physical Exam: Vitals:   06/14/18 1930 06/14/18 2000 06/14/18 2015 06/14/18 2111  BP: (!) 176/70 (!) 161/70 (!) 180/77 (!) 186/72  Pulse: (!) 53 (!) 55 62 (!) 56  Resp: 19 18 15 18   Temp:    97.9 F (36.6 C)  TempSrc:    Oral  SpO2: 97% 97% 97%  99%  Weight:    121.9 kg  Height:    6\' 2"  (1.88 m)      Constitutional: NAD, calm, comfortable Vitals:   06/14/18 1930 06/14/18 2000 06/14/18 2015 06/14/18 2111  BP: (!) 176/70 (!) 161/70 (!) 180/77 (!) 186/72  Pulse: (!) 53 (!) 55 62 (!) 56  Resp: 19 18 15 18   Temp:    97.9 F (36.6 C)  TempSrc:    Oral  SpO2: 97% 97% 97% 99%  Weight:    121.9 kg  Height:    6\' 2"  (1.88 m)   Eyes: PERRL, lids and conjunctivae normal ENMT: Mucous membranes are moist. Posterior pharynx clear of any exudate or lesions.Normal dentition.  Neck: normal, supple, no masses, no thyromegaly Respiratory: clear to auscultation bilaterally, no wheezing, no crackles. Normal respiratory effort. No accessory muscle use.  Cardiovascular: Irregularly irregular rhythm, no murmurs / rubs / gallops. No extremity edema. 2+ pedal pulses. No carotid bruits.  Abdomen: no tenderness, no masses palpated. No hepatosplenomegaly. Bowel sounds positive.  Musculoskeletal: no clubbing / cyanosis. No joint deformity upper and lower extremities. Good ROM, no contractures. Normal muscle tone.  Skin: no rashes, lesions, ulcers. No induration Neurologic: Left sided weakness 4 out of 5 upper and lower extremity CN 2-12 grossly intact. Sensation intact, DTR normal. Strength 5/5 in all 4.  Psychiatric: Sleepy, Alert and oriented x 3. Normal mood.     Labs on Admission: I have personally reviewed following labs and imaging studies  CBC: Recent Labs  Lab 06/14/18 1648 06/14/18 1717  WBC 9.3  --   NEUTROABS 6.3  --   HGB 12.4* 13.6  HCT 39.7 40.0  MCV 81.9  --   PLT 200  --    Basic Metabolic Panel: Recent Labs  Lab 06/14/18 1648 06/14/18 1717  NA 136 138  K 4.0 4.0  CL 103 102  CO2 24  --   GLUCOSE 291* 286*  BUN 14 15  CREATININE 1.05 1.10  CALCIUM 9.6  --    GFR: Estimated Creatinine Clearance: 61.9 mL/min (by C-G formula based on SCr of 1.1 mg/dL). Liver Function Tests: Recent Labs  Lab 06/14/18 1648    AST 15  ALT 13  ALKPHOS 59  BILITOT 0.5  PROT 7.1  ALBUMIN 3.4*   No results for input(s): LIPASE, AMYLASE in the last 168 hours. No results for input(s): AMMONIA in the last 168 hours. Coagulation Profile: Recent Labs  Lab 06/14/18 1648  INR 1.00   Cardiac Enzymes: No results for input(s): CKTOTAL, CKMB, CKMBINDEX, TROPONINI in the last 168 hours. BNP (last 3 results) No results for input(s): PROBNP in the last 8760 hours. HbA1C: No results for input(s): HGBA1C in the last 72 hours. CBG: Recent Labs  Lab 06/14/18 1649  GLUCAP 262*   Lipid Profile: No results for input(s): CHOL, HDL, LDLCALC, TRIG, CHOLHDL, LDLDIRECT in the last 72 hours. Thyroid Function Tests: No results for input(s): TSH, T4TOTAL, FREET4, T3FREE, THYROIDAB in the last 72 hours. Anemia Panel: No results for input(s): VITAMINB12, FOLATE, FERRITIN, TIBC, IRON, RETICCTPCT in the last 72 hours. Urine analysis:    Component Value Date/Time   COLORURINE YELLOW 06/14/2018 1648   APPEARANCEUR CLEAR 06/14/2018 1648   LABSPEC 1.010 06/14/2018 1648   PHURINE 5.0 06/14/2018 1648   GLUCOSEU 150 (A) 06/14/2018 1648   HGBUR NEGATIVE 06/14/2018 1648   BILIRUBINUR NEGATIVE 06/14/2018 1648   KETONESUR NEGATIVE 06/14/2018 1648   PROTEINUR 100 (A) 06/14/2018 1648   UROBILINOGEN 0.2 12/13/2014 2011   NITRITE NEGATIVE 06/14/2018 1648   LEUKOCYTESUR NEGATIVE 06/14/2018 1648   Sepsis Labs: @LABRCNTIP (procalcitonin:4,lacticidven:4) )No results found for this or any previous visit (from the past 240 hour(s)).   Radiological Exams on Admission: Dg Chest 2 View  Result Date: 06/14/2018 CLINICAL DATA:  Weakness, difficulty using left hand EXAM: CHEST - 2 VIEW COMPARISON:  03/23/2018 FINDINGS: There is bilateral chronic interstitial thickening. There is no focal parenchymal opacity. There is no pleural effusion or pneumothorax. The heart and mediastinal contours are unremarkable. The osseous structures are  unremarkable. IMPRESSION: No active cardiopulmonary disease. Electronically Signed   By: Elige Ko   On: 06/14/2018 19:32   Ct Head Code Stroke Wo Contrast  Result Date: 06/14/2018 CLINICAL DATA:  Code stroke. Slurred speech, aphasia and confusion. Left facial droop. EXAM: CT HEAD WITHOUT CONTRAST TECHNIQUE: Contiguous axial images were obtained from the base of the skull through the vertex without intravenous contrast. COMPARISON:  Head CT 03/24/2018 FINDINGS: Brain: There is no mass, hemorrhage or extra-axial collection. There is generalized atrophy without lobar predilection. There is hypoattenuation of the periventricular white matter, most commonly indicating chronic ischemic microangiopathy. Vascular: No abnormal hyperdensity of the major intracranial arteries or dural venous sinuses. No intracranial atherosclerosis. Skull: The visualized skull base, calvarium and extracranial soft tissues are normal. Sinuses/Orbits: No fluid levels or advanced mucosal thickening of the visualized paranasal sinuses. No mastoid or middle ear effusion. The orbits are normal. ASPECTS [facility] Stroke Program Early CT Score) - Ganglionic level infarction (caudate, lentiform nuclei, internal capsule, insula, M1-M3 cortex): 7 - Supraganglionic infarction (M4-M6 cortex): 3 Total score (0-10 with 10 being normal): 10 IMPRESSION: 1. No acute hemorrhage. 2. ASPECTS is 10. These results were communicated to Dr. Georgiana Spinner Aroor at 5:13 pm on 06/14/2018 by text page via the [facility] messaging system. Electronically Signed   By: Deatra Robinson M.D.   On: 06/14/2018 17:14    EKG: Independently reviewed.  It shows atrial fibrillation with a rate of 89 with left bundle branch block but not new  Assessment/Plan Principal Problem:   Acute CVA (cerebrovascular accident) (HCC) Active Problems:   Gastroesophageal reflux disease without esophagitis   Essential hypertension   Hyperlipidemia   DM type 2 (diabetes mellitus, type 2)  (HCC)   H/O: CVA (cerebrovascular accident)     #1 suspected acute CVA: Patient will be admitted for observation.  Will follow recommendations from neurology.  Most likely will need repeat CT or MRI of the brain.  Had an echo in August so may no need to repeat echocardiogram.  Aspirin and statin.  The patient is already on blood thinner.  PT OT consultation.  No evidence of any infection like UTI or pneumonia.  #2 hypertension: Continue with home regimen.    #  3 hyperlipidemia: Continue with statin  #4 diabetes: Blood sugar uncontrolled.  Continue with home regimen with sliding scale insulin.  #5 GERD: Continue with PPIs  DVT prophylaxis: Eliquis Code Status: Full code Family Communication: Wife and daughter at bedside Disposition Plan: Home Consults called: Neurology Admission status: Observation  Severity of Illness: The appropriate patient status for this patient is OBSERVATION. Observation status is judged to be reasonable and necessary in order to provide the required intensity of service to ensure the patient's safety. The patient's presenting symptoms, physical exam findings, and initial radiographic and laboratory data in the context of their medical condition is felt to place them at decreased risk for further clinical deterioration. Furthermore, it is anticipated that the patient will be medically stable for discharge from the hospital within 2 midnights of admission. The following factors support the patient status of observation.   " The patient's presenting symptoms include altered mental status. " The physical exam findings include confusion with remote left-sided weakness. " The initial radiographic and laboratory data are no obvious infectious source.     Lonia Blood MD [facility] Pager [phone]  If 7PM-7AM, please contact night-coverage [email or web address] Password [email or web address]  06/14/2018, 9:12 PM

## 2018-06-14 NOTE — ED Notes (Signed)
Patient transported to X-ray 

## 2018-06-14 NOTE — ED Notes (Addendum)
Pt with wet brief, changed, cleaned, new chux and brief applied; urine collected via urinal Wound noted to L foot with discolored skin and open wounds to toes, family states "been there a while"

## 2018-06-14 NOTE — ED Triage Notes (Signed)
Pt screened by sort RN, Dr. Pilar PlateBero notified of symptoms and will meet pt in room to evaluate

## 2018-06-14 NOTE — ED Notes (Signed)
Diet ordered 

## 2018-06-14 NOTE — ED Notes (Addendum)
Attempted report x2; unable to give report, "no one available to take report"; name and call back number provided; room has been assigned 18 min at this point

## 2018-06-14 NOTE — ED Triage Notes (Signed)
Pt from home with family with onset of difficulty using left hand, garbled speech, ams that began around 1620. The pt was at an appointment at 1430 and was noted by family to be having difficulty using right hand to sign papers but was noted to be at baseline just prior to this. Pt has hx of cva with left side deficits. Code stroke activated, Neuro MD, EDP and 2 RN's at bedside.

## 2018-06-14 NOTE — ED Notes (Signed)
Code stroke activated ?

## 2018-06-14 NOTE — Consult Note (Addendum)
Neurology Consultation  Reason for Consult: Code stroke Referring Physician: Pilar Plate  CC: Dysarthria and word salad  History is obtained from: Family  HPI: Tom Wang is a 84 y.o. 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  Patient apparently went to his doctor's office today to get an eye exam.  When he was writing his name down while he was in the waiting room approximately 230 they noted that he was having trouble writing.  When they talk to him he notably had some dysarthria and word salad.  Per wife he was not making sense and perseverating on words.  When asked when he was last known well this was very hard to obtain initially it was 430, then it went to 230 when they went to the doctor's appointment however when she goes back to 12:00 there is a possibility that EEG at that time was not normal.  That given patient is on Eliquis and was not a TPA candidate.  In addition he is wheelchair-bound and MRSA 4 thus he was not an IR candidate.  Of note: patient had a full stroke work-up back on 03/25/2018.  At that time his LDL was 171, HbA1c was 9.0, echo showed 55 to 60% with no PFO.  MRI at that time showed no acute stroke but old right basal ganglia lacunar strokes, MRA showed no LVO but loss of basilar artery signal concerning for stenosis.  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 ED course: Code stroke called in triage and patient brought to CT scan   LKW: At this point it is unclear however, family notes that the first time they noted that he was having word salad and difficulty writing was at approximately 1430 tpa given?: no, on Eliquis Premorbid modified Rankin scale (mRS): 4 NIH stroke score 12 ICH Score: 0   ROS:  Unable to obtain due to altered mental status.   Past Medical History:  Diagnosis Date  .  Arthritis    ankles  . Diabetes mellitus    oral meds only  . GERD (gastroesophageal reflux disease)   . Hypertension   . Pneumonia    20 years ago .  [facility])    Family History  Problem Relation Age of Onset  . Other Grandchild    Social History:   reports that he has never smoked. He has never used smokeless tobacco. He reports that he does not drink alcohol or use drugs.  Medications No current facility-administered medications for this encounter.   Current Outpatient Medications:  .  acetaminophen (TYLENOL) 500 MG tablet, Take 500 mg by mouth daily. , Disp: , Rfl:  .  Acetylcarnitine HCl (ACETYL L-CARNITINE PO), Take 400 mg by mouth daily., Disp: , Rfl:  .  Alpha-Lipoic Acid 200 MG TABS, Take 200 mg by mouth daily., Disp: , Rfl:  .  apixaban (ELIQUIS) 2.5 MG TABS tablet, Take 2.5 mg by mouth 2 (two) times daily., Disp: , Rfl:  .  Calcium Citrate 333 MG TABS, Take 333 mg by mouth daily., Disp: , Rfl:  .  cholecalciferol (VITAMIN D) 1000 UNITS tablet, Take 500 Units by mouth at bedtime. , Disp: , Rfl:  .  ezetimibe (ZETIA) 10 MG tablet, Take 1 tablet (10 mg total) by mouth daily., Disp: 30 tablet, Rfl: 0 .  ferrous sulfate 325 (65 FE) MG EC  tablet, Take 325 mg by mouth daily with breakfast., Disp: , Rfl:  .  glipiZIDE (GLUCOTROL) 10 MG tablet, Take 10 mg by mouth 2 (two) times daily before a meal., Disp: , Rfl:  .  Lactobacillus Rhamnosus, GG, (CULTURELLE) CAPS, Take 1 capsule by mouth daily., Disp: , Rfl:  .  Magnesium 250 MG TABS, Take 250 mg by mouth at bedtime., Disp: , Rfl:  .  Misc Natural Products (TURMERIC CURCUMIN) CAPS, Take 1 capsule by mouth daily. , Disp: , Rfl:  .  Multiple Vitamin (MULTIVITAMIN WITH MINERALS) TABS, Take 1 tablet by mouth daily. , Disp: , Rfl:  .  omeprazole (PRILOSEC) 20 MG capsule, Take 20 mg by mouth daily., Disp: , Rfl:  .  senna-docusate (SENOKOT-S) 8.6-50 MG tablet, Take 2 tablets by mouth daily., Disp: , Rfl:  .  vitamin B-12  (CYANOCOBALAMIN) 1000 MCG tablet, Take 1,000 mcg by mouth daily., Disp: , Rfl:    Exam: Current vital signs: BP (!) 167/74 (BP Location: Left Arm)   Pulse (!) 54   Temp 97.6 F (36.4 C) (Oral)   Resp 14   SpO2 96%  Vital signs in last 24 hours: Temp:  [97.6 F (36.4 C)] 97.6 F (36.4 C) (11/13 1648) Pulse Rate:  [54-57] 54 (11/13 1716) Resp:  [14-16] 14 (11/13 1716) BP: (167)/(74) 167/74 (11/13 1648) SpO2:  [96 %-97 %] 96 % (11/13 1716)  Physical Exam  Constitutional: Appears well-developed and well-nourished.  Psych: Affect appropriate to situation Eyes: No scleral injection HENT: No OP obstrucion Head: Normocephalic.  Cardiovascular: Normal rate and regular rhythm.  Respiratory: Effort normal, non-labored breathing GI: Soft.  No distension. There is no tenderness.  Skin: WDI  Neuro: Mental Status: Patient is awake, alert, not oriented to place month or year. Patient cannot give clear coherent history No signs of neglect but does have dysarthria Cranial Nerves: II: Visual Fields are full. III,IV, VI: EOMI with ptosis of left eye.  Pupils are equal, round, and reactive to light.   V: Facial sensation is symmetric to temperature VII: Full left facial droop VIII: hearing is intact to voice X: Uvula elevates symmetrically XI: Shoulder shrug is symmetric. XII: tongue is midline without atrophy or fasciculations.  Motor: Unable to lift bilateral legs, able to wiggle bilateral toes, able to lift right arm antigravity with good strength, left arm has a 3/5 strength. Sensory: Sensation is symmetric to light touch and temperature in arms and upper legs Deep Tendon Reflexes: 2+ in upper extremities with 0 knee jerk or patella Plantars: Mute bilaterally Cerebellar: Finger-to-nose to finger was normal unable to do heel to knee  Labs I have reviewed labs in epic and the results pertinent to this consultation are:   CBC    Component Value Date/Time   WBC 7.9  03/23/2018 0317   RBC 4.48 03/23/2018 0317   HGB 11.6 (L) 03/23/2018 0317   HCT 36.4 (L) 03/23/2018 0317   PLT 185 03/23/2018 0317   MCV 81.3 03/23/2018 0317   MCH 25.9 (L) 03/23/2018 0317   MCHC 31.9 03/23/2018 0317   RDW 15.2 03/23/2018 0317   LYMPHSABS 1.9 03/23/2018 0317   MONOABS 1.1 (H) 03/23/2018 0317   EOSABS 0.2 03/23/2018 0317   BASOSABS 0.1 03/23/2018 0317    CMP     Component Value Date/Time   NA 139 03/23/2018 0317   K 3.8 03/23/2018 0317   CL 106 03/23/2018 0317   CO2 26 03/23/2018 0317   GLUCOSE 217 (H) 03/23/2018 [id number]  BUN 14 03/23/2018 0317   CREATININE 0.94 03/23/2018 0317   CALCIUM 9.7 03/23/2018 0317   PROT 6.6 03/23/2018 0317   ALBUMIN 3.5 03/23/2018 0317   AST 18 03/23/2018 0317   ALT 12 03/23/2018 0317   ALKPHOS 57 03/23/2018 0317   BILITOT 0.7 03/23/2018 0317   GFRNONAA >60 03/23/2018 0317   GFRAA >60 03/23/2018 0317    Lipid Panel     Component Value Date/Time   CHOL 246 (H) 03/24/2018 0333   TRIG 108 03/24/2018 0333   HDL 53 03/24/2018 0333   CHOLHDL 4.6 03/24/2018 0333   VLDL 22 03/24/2018 0333   LDLCALC 171 (H) 03/24/2018 0333     Imaging I have reviewed the images obtained:  CT-scan of the brain-no acute hemorrhage or infarct.  Felicie Morn PA-C [facility] [phone]  M-F  (9:00 am- 5:00 PM)  06/14/2018, 5:19 PM   NEUROHOSPITALIST ADDENDUM Performed a face to face diagnostic evaluation.   I have reviewed the contents of history and physical exam as documented by PA/ARNP/Resident and agree with above documentation.  I have discussed and formulated the above plan as documented. Edits to the note have been made as needed.     Assessment:  84 year old male with recent Bells palsy, prior CVA, wheel chair bound with multiple stroke risk factors presented to the hospital after family noted to have garbled speech after 2:30 PM today while at his doctor's office.  Patient is on Eliquis and not a TPA candidate.   His mRS is 4 and thus not a interventional candidate.  Patient has had a full stroke work-up within the last 3 months.  During that time there was no significant abnormalities with echo, he does have intracranial atherosclerosis, LDL was elevated and A1c was elevated.  Acute ischemic stroke versus recrudescence of old stroke symptoms  Recommendations Repeat CT head tomorrow if unable to tolerate MRI brain Check UA, CBC, BMP, CXR Neurochecks Stroke swallow screen PT/OT eval  Continue Eliquis    Georgiana Spinner Kinlie Janice MD [facility] [phone]   If 7pm to 7am, please call on call as listed on AMION.

## 2018-06-14 NOTE — Code Documentation (Signed)
84[facility] [phone]. Patient was noted by his family to have sudden difficulty with speech and was brought to the hospital by family. Code stroke activated for aphasia and right hand weakness. Stroke team to the bedside. Family at the bedside and report patient with confusion throughout the day. Patient also noted to be lethargic this morning but this is not unusual for him. He had an eye appointment this afternoon where he had difficulty signing paperwork with his right hand. Then later at 1620 he was noted to have difficulty getting his words out with word substitution. LKW unclear. Of note, patient with h/o stroke in 2014 and is wheelchair bound. He also has a h/o Bell's palsy on the left side. Patient transported to CT with team. CT completed. NIHSS 12, see documentation for details and code stroke times. Most of exam at baseline d/t Bell's palsy and bilateral leg weakness. Patient with dysarthria for which family reports is not his baseline but it much improved from before. Patient is not a candidate for treatment with tPA d/t taking Eliquis dose this morning. Patient is not a candidate for endovascular intervention d/t PmRS. No acute stroke treatment at this time per neurologist. Bedside handoff with ED RN Huntley DecSara.

## 2018-06-15 ENCOUNTER — Encounter (HOSPITAL_COMMUNITY): Payer: Medicare HMO

## 2018-06-15 ENCOUNTER — Inpatient Hospital Stay (HOSPITAL_COMMUNITY): Payer: Medicare HMO

## 2018-06-15 ENCOUNTER — Other Ambulatory Visit: Payer: Self-pay

## 2018-06-15 DIAGNOSIS — I10 Essential (primary) hypertension: Secondary | ICD-10-CM

## 2018-06-15 DIAGNOSIS — I6789 Other cerebrovascular disease: Secondary | ICD-10-CM

## 2018-06-15 DIAGNOSIS — E785 Hyperlipidemia, unspecified: Secondary | ICD-10-CM

## 2018-06-15 DIAGNOSIS — E1149 Type 2 diabetes mellitus with other diabetic neurological complication: Secondary | ICD-10-CM

## 2018-06-15 DIAGNOSIS — I639 Cerebral infarction, unspecified: Principal | ICD-10-CM

## 2018-06-15 DIAGNOSIS — Z8673 Personal history of transient ischemic attack (TIA), and cerebral infarction without residual deficits: Secondary | ICD-10-CM

## 2018-06-15 DIAGNOSIS — K219 Gastro-esophageal reflux disease without esophagitis: Secondary | ICD-10-CM

## 2018-06-15 LAB — LIPID PANEL
Cholesterol: 224 mg/dL — ABNORMAL HIGH (ref 0–200)
HDL: 55 mg/dL (ref >40)
LDL CALC: 146 mg/dL — AB (ref 0–99)
TRIGLYCERIDES: 115 mg/dL (ref <150)
Total CHOL/HDL Ratio: 4.1 RATIO
VLDL: 23 mg/dL (ref 0–40)

## 2018-06-15 LAB — GLUCOSE, CAPILLARY
GLUCOSE-CAPILLARY: 233 mg/dL — AB (ref 70–99)
GLUCOSE-CAPILLARY: 217 mg/dL — AB (ref 70–99)
Glucose-Capillary: 235 mg/dL — ABNORMAL HIGH (ref 70–99)
Glucose-Capillary: 275 mg/dL — ABNORMAL HIGH (ref 70–99)

## 2018-06-15 LAB — HEMOGLOBIN A1C
Hgb A1c MFr Bld: 9.8 % — ABNORMAL HIGH (ref 4.8–5.6)
Mean Plasma Glucose: 234.56 mg/dL

## 2018-06-15 LAB — ECHOCARDIOGRAM COMPLETE
Height: 74 in
WEIGHTICAEL: 4299.85 [oz_av]

## 2018-06-15 MED ORDER — ATORVASTATIN CALCIUM 40 MG PO TABS
40.0000 mg | ORAL_TABLET | Freq: Every day | ORAL | Status: DC
  Administered 2018-06-15 – 2018-06-16 (×2): 40 mg via ORAL
  Filled 2018-06-15 (×2): qty 1

## 2018-06-15 MED ORDER — ATORVASTATIN CALCIUM 80 MG PO TABS: 80.0000 mg | ORAL_TABLET | Freq: Every day | ORAL | Status: DC

## 2018-06-15 MED ORDER — PERFLUTREN LIPID MICROSPHERE
1.0000 mL | INTRAVENOUS | Status: AC | PRN
  Administered 2018-06-15: 2 mL via INTRAVENOUS
  Filled 2018-06-15: qty 10

## 2018-06-15 NOTE — Progress Notes (Signed)
PT Progress Note for Charges    06/15/18 1045  PT General Charges  $$ ACUTE PT VISIT 1 Visit  PT Evaluation  $PT Eval Moderate Complexity 1 Mod  PT Treatments  $Therapeutic Activity 8-22 mins  Deborah ChalkJennifer Nnaemeka Samson, PT, DPT  Acute Rehabilitation Services Pager [phone] Office [phone]

## 2018-06-15 NOTE — Evaluation (Signed)
Speech Language Pathology Evaluation Patient Details Name: Tom Wang MRN: [id number] DOB: 1924/10/24 Today's Date: 06/15/2018 Time: [id number] SLP Time Calculation (min) (ACUTE ONLY): 40 min  Problem List:  Patient Active Problem List   Diagnosis Date Noted  . Acute CVA (cerebrovascular accident) (HCC) 06/14/2018  . Somnolence   . TIA (transient ischemic attack) 03/23/2018  . DM type 2 (diabetes mellitus, type 2) (HCC) 03/23/2018  . H/O: CVA (cerebrovascular accident) 03/23/2018  . Left hemiparesis (HCC) 03/23/2018  . Chronic anticoagulation- for h/o LLE DVT, on Eliquis. 03/23/2018  . Gastroesophageal reflux disease without esophagitis 08/29/2016  . Essential hypertension 08/29/2016  . Hyperlipidemia 08/29/2016   Past Medical History:  Past Medical History:  Diagnosis Date  . Arthritis    ankles  . Diabetes mellitus    oral meds only  . GERD (gastroesophageal reflux disease)   . Hypertension   . Pneumonia    20 years ago .  Marland Kitchen. Stroke [facility](HCC)    Past Surgical History:  Past Surgical History:  Procedure Laterality Date  . CATARACT EXTRACTION W/PHACO  04/26/2012   Procedure: CATARACT EXTRACTION PHACO AND INTRAOCULAR LENS PLACEMENT (IOC);  Surgeon: Chalmers Guestoy Whitaker, MD;  [facility];  Service: Ophthalmology;  Laterality: Right;  . TONSILLECTOMY    . TUMOR EXCISION     60 years ago .. right forearm .    HPI:  84 year old male admitted 06/14/18 with abnormal speech and weakness. PMH: DM, HTN, recent LEFT Bell's Palsy, GERD, CVAs. MRI = Acute 14mm LEFT thalamus and 4mm LEFT pons infarcts. CXR = negative Prior Cog/com eval 03/25/18 = reduced intelligibility, processing delay. Rec HHST.   Assessment / Plan / Recommendation Clinical Impression  Pt known to speech therapy services, having received speech/language evaluation in August 2019. At that time, pt exhibited decreased processing speed and reduced intelligibility. Pt continues to exhibit these deficits. Daughter reports  speech is more dysarthric than previously. Pt has history of left side Bell's Palsy, which further contributes to poor intelligibility.   Pt had difficulty answering questions on the Mini-Mental State exam, due to perseverative responses. Continued evaluation focused on auditory comprehension and verbal expression.  Pt was able to answer complex yes/no questions accurately, and follow 2 step verbal commands. During assessment of automatic sequences, pt had difficulty listing days and months. He was able to list appropriately after being given first item. Pt repeats sentence level material, and answered responsive naming questions accurately. Pt able to name common objects around his room with 80% accuracy. Decreased thought organization/verbal fluency noted when asked to name animals. Perseverative responses noted intermittently throughout the evaluation.   Previous SLE recommended home health speech therapy. Daughter reports this was discontinued, as pt was nonparticipatory. Pt lives with his granddaughter Tom Wang(POA) and daughter. Further ST work up is recommended to determine extent of cognitive-linguistic deficits and safety for living at home.     SLP Assessment  SLP Recommendation/Assessment: Patient needs continued Speech Language Pathology Services SLP Visit Diagnosis: Cognitive communication deficit (R41.841)    Follow Up Recommendations  24 hour supervision/assistance    Frequency and Duration min 1 x/week  2 weeks      SLP Evaluation Cognition  Overall Cognitive Status: Impaired/Different from baseline Arousal/Alertness: Awake/alert Orientation Level: Oriented to person;Oriented to place;Disoriented to time;Disoriented to situation Attention: Focused;Sustained Focused Attention: Appears intact Sustained Attention: Impaired Sustained Attention Impairment: Verbal basic       Comprehension  Auditory Comprehension Overall Auditory Comprehension: Impaired Yes/No Questions: Within  Functional Limits Commands:  Impaired Multistep Basic Commands: 0-24% accurate Conversation: Simple Interfering Components: Attention;Processing speed;Working Radio broadcast assistant: Repetition Reading Comprehension Reading Status: (comprehension of sentence length material)    Expression Expression Primary Mode of Expression: Verbal Verbal Expression Overall Verbal Expression: Impaired Initiation: No impairment Automatic Speech: Name;Social Response(required assistance with days, months) Level of Generative/Spontaneous Verbalization: Sentence Repetition: No impairment Naming: Impairment Responsive: 76-100% accurate Confrontation: Impaired Verbal Errors: Perseveration Pragmatics: Impairment Impairments: Abnormal affect;Monotone;Eye contact Interfering Components: Speech intelligibility Written Expression Dominant Hand: Right Written Expression: Not tested   Oral / Motor  Oral Motor/Sensory Function Overall Oral Motor/Sensory Function: Moderate impairment Facial ROM: Reduced left Facial Symmetry: Abnormal symmetry left Facial Strength: Reduced left Facial Sensation: Reduced left Lingual ROM: Within Functional Limits Lingual Symmetry: Within Functional Limits Lingual Strength: Reduced Lingual Sensation: Within Functional Limits Mandible: Within Functional Limits Motor Speech Overall Motor Speech: Impaired Respiration: Within functional limits Phonation: Normal Resonance: Within functional limits Articulation: Impaired Level of Impairment: Sentence Intelligibility: Intelligibility reduced Word: 75-100% accurate Phrase: 75-100% accurate Sentence: 50-74% accurate Conversation: 50-74% accurate Motor Planning: Witnin functional limits Effective Techniques: Slow rate   GO                   Tom Wang B. Murvin Natal [facility], CCC-SLP Speech Language Pathologist [phone]  Tom Wang 06/15/2018, 3:41 PM

## 2018-06-15 NOTE — Progress Notes (Signed)
Text paged MD that pt's family states that insulin gives him stroke-like symptoms. MD discontinued insulin order.

## 2018-06-15 NOTE — Evaluation (Signed)
Physical Therapy Evaluation Patient Details Name: Tom Wang MRN: [id number] DOB: 1925-06-01 Today's Date: 06/15/2018   History of Present Illness  Pt is a 84 y.o male with a PMH consisting of DM, HTN, Bell's palsy, and CVA with residual R sided weakness presents to the ED with complaints of abnormal speech and weakness. MRI on 06/15/2018 showed acute L thalamic and pons infarct, as well as chronic R basal ganglia infarcts.   Clinical Impression  Pt presents supine, HOB elevated, and awake. Pt willing to participate in PT. Prior to admission, pt used manual wheelchair for mobility, and was able to self propel with BUE. Pt was also assisted with ADL's, including bathing and tolieting. Pt currently lives at home with family, in a level entrance 1 story house. Pt also has the service of a CNA 5x week, and the family owns a Haematologist. Pt was overall 3+ max A for all bed mobility with assistance from daughter. Pt at times difficult to understand and showed signs of receptive and expressive difficulties. Pt unable to tolerate more than 5 minutes of sitting balance due to pain and discomfort. Current recommendation of Home Health PT is due to family adamant of pt returning home and the current equipment that they own. Pt would benefit from continued PT in order to increase functional mobility and to prevent skin break down and contractures.    Follow Up Recommendations Home health PT;Supervision/Assistance - 24 hour    Equipment Recommendations  None recommended by PT    Recommendations for Other Services OT consult     Precautions / Restrictions Precautions Precautions: Fall Restrictions Weight Bearing Restrictions: No Other Position/Activity Restrictions: Per family pt uses lift at home, he does not do SPT      Mobility  Bed Mobility Overal bed mobility: Needs Assistance Bed Mobility: Rolling;Supine to Sit;Sit to Supine Rolling: Max assist;+2 for physical assistance   Supine  to sit: Max assist;+2 for physical assistance Sit to supine: Max assist;+2 for physical assistance   General bed mobility comments: +3 max A with daughter over eager to help and wanted to provided assistance. . Pt required multimodal cueing throughout mobility. Pt required BLE assistance and trunk elevation. Pt unable to initiate movement in BLE. Pt able to initiate UE during rolling.     Transfers Overall transfer level: (Deferred  )                  Ambulation/Gait                Stairs            Wheelchair Mobility    Modified Rankin (Stroke Patients Only) Modified Rankin (Stroke Patients Only) Pre-Morbid Rankin Score: Moderately severe disability Modified Rankin: Severe disability     Balance Overall balance assessment: Needs assistance Sitting-balance support: Bilateral upper extremity supported;Feet supported Sitting balance-Leahy Scale: Poor Sitting balance - Comments: Pt unable to maintain sitting balance without mod A trunk support and BUE support.                                      Pertinent Vitals/Pain Pain Assessment: Faces Faces Pain Scale: Hurts little more Pain Location: Generalized, BLE   Pain Descriptors / Indicators: Grimacing;Guarding Pain Intervention(s): Monitored during session;Limited activity within patient's tolerance    Home Living Family/patient expects to be discharged to:: Private residence Living Arrangements: Children Available Help at Discharge: Family;Available  24 hours/day Type of Home: House Home Access: Level entry     Home Layout: One level Home Equipment: Wheelchair - manual;Hospital bed;Other (comment)(Sara Plus Lift) Additional Comments: CNA comes 5x week for 3 hours     Prior Function Level of Independence: Needs assistance   Gait / Transfers Assistance Needed: Manual wheelchair that is self propelled by BUE required transfers with sara plus   ADL's / Homemaking Assistance Needed:  Dependent for all ADL's         Hand Dominance   Dominant Hand: Right    Extremity/Trunk Assessment   Upper Extremity Assessment- Defer to OT      Lower Extremity Assessment Lower Extremity Assessment: Defer to PT evaluation RLE Deficits / Details: Standardized MMT unable to complete secondary to cognitive deficits. However, no muscle contraction noted for RLE throughout session. Restricted in hip flexion PROM secondary to pain. Decreased sensation overall of RLE compared to the LLE   RLE Sensation: decreased light touch RLE Coordination: decreased fine motor;decreased gross motor LLE Deficits / Details: Standardized MMT unable to complete secondary to cognitive deficits. However, no muscle contraction noted for LLE throughout session. Restricted in hip flexion PROM secondary to pain  LLE Sensation: WNL LLE Coordination: decreased fine motor;decreased gross motor       Communication   Communication: Expressive difficulties;HOH  Cognition Arousal/Alertness: Awake/alert Behavior During Therapy: Anxious Overall Cognitive Status: Impaired/Different from baseline Area of Impairment: Attention;Following commands;Awareness;Problem solving;Memory                   Current Attention Level: Sustained Memory: Decreased short-term memory Following Commands: Follows one step commands inconsistently;Follows one step commands with increased time   Awareness: Intellectual Problem Solving: Slow processing;Decreased initiation;Requires tactile cues;Requires verbal cues;Difficulty sequencing        General Comments General comments (skin integrity, edema, etc.): Ulcerations L dorsum of foot/toes     Exercises     Assessment/Plan    PT Assessment Patient needs continued PT services  PT Problem List Decreased strength;Decreased range of motion;Decreased activity tolerance;Decreased balance;Decreased mobility;Decreased coordination;Decreased cognition;Decreased knowledge of use  of DME;Decreased safety awareness       PT Treatment Interventions DME instruction;Functional mobility training;Therapeutic activities;Therapeutic exercise;Balance training;Neuromuscular re-education;Cognitive remediation;Wheelchair mobility training;Patient/family education    PT Goals (Current goals can be found in the Care Plan section)  Acute Rehab PT Goals Patient Stated Goal: to return home to be taken care of by family PT Goal Formulation: With patient/family Time For Goal Achievement: 06/29/18 Potential to Achieve Goals: Fair    Frequency Min 4X/week   Barriers to discharge        Co-evaluation               AM-PAC PT "6 Clicks" Daily Activity  Outcome Measure Difficulty turning over in bed (including adjusting bedclothes, sheets and blankets)?: Unable Difficulty moving from lying on back to sitting on the side of the bed? : Unable Difficulty sitting down on and standing up from a chair with arms (e.g., wheelchair, bedside commode, etc,.)?: Unable Help needed moving to and from a bed to chair (including a wheelchair)?: Total Help needed walking in hospital room?: Total Help needed climbing 3-5 steps with a railing? : Total 6 Click Score: 6    End of Session   Activity Tolerance: Patient limited by fatigue;Patient limited by pain Patient left: in bed;with call bell/phone within reach;with bed alarm set;with family/visitor present Nurse Communication: Mobility status;Need for lift equipment PT Visit Diagnosis: Muscle weakness (generalized) (M62.81);Other  abnormalities of gait and mobility (R26.89)    Time: 1610-9604 PT Time Calculation (min) (ACUTE ONLY): 32 min   Charges:   PT Evaluation $PT Eval Moderate Complexity: 1 Mod PT Treatments $Therapeutic Activity: 8-22 mins        Tom Wang, SPT Acute Rehab [phone] (pager) [phone] (office)  Tom Wang 06/15/2018, 10:48 AM

## 2018-06-15 NOTE — Progress Notes (Signed)
PROGRESS NOTE    Tom Wang  ZOX:[id number] DOB: 1925/02/20 DOA: 06/14/2018 PCP: [name]   Brief Narrative:  HPI on 06/14/2018 by Dr. [name] is a 84 y.o. male with medical history significant of diabetes, hypertension, recent Bell's palsy, GERD, previous CVAs who was brought in by family today due to sudden onset of abnormal speech and aphasia with confusion.  Symptoms occurred around 2:30 in the afternoon.  Patient was apparently coming from his ophthalmologist where he had some eyedrops in his eyes.  Family initially thought it was reaction to the medications.  He was unable to identify wife and daughter.  They brought him to the ER where his speech returned spontaneously around 4:00.  He has been evaluated in the ER and by neurology.  Suspected recurrent CVA however this could also relate to some infection or drug reaction.  Patient is currently awake and communicative but is weak and drowsy.  He is being admitted for CVA work-up.  Interim history Admitted for CVA. Neurology following.  Assessment & Plan   Acute CVA -Presented with abnormal speech -CT head no acute hemorrhage -MRI brain: Acute 14mm left thalamus and left pons nonhemorrhagic infarcts -Hemoglobin A1c 9.8, LDL 146 -Echocardiogram pending -PT recommended [facility] -neurology consulted and appreciated- pending recommendations -Continue Eliquis and will start on lipitor    Essential hypertension -allowing for permissive HTN  Hyperlipidemia -Continue statin  Diabetes mellitus, type II -Continue glipizide, patient refuses to take insulin due to allergy -hemoglobin A1c 9.6 (will need to follow up with PCP for better CBG control) -Monitor CBGs  GERD -Continue PPI  DVT Prophylaxis  Eliquis  Code Status: Full  Family Communication:  Daughter at bedside  Disposition Plan: Admitted. Pending further neuro recommendations  Consultants Neurology   Procedures    Echocardiogram  Antibiotics   Anti-infectives (From admission, onward)   None      Subjective:   Tom Wang seen and examined today.  Denies current pain. Wants help moving his legs on the bed. Denies current chest pain, shortness of breath, abdominal pain, N/V/D/C.     Objective:   Vitals:   06/15/18 0000 06/15/18 0200 06/15/18 0400 06/15/18 1200  BP: (!) 178/75 (!) 163/68 (!) 144/66 (!) 165/67  Pulse: (!) 55 (!) 57  (!) 58  Resp: 18 18 18    Temp: 98 F (36.7 C) 98.2 F (36.8 C) 98.2 F (36.8 C) 98.3 F (36.8 C)  TempSrc: Oral Oral Oral Oral  SpO2: 97% 97% 95% 95%  Weight:      Height:        Intake/Output Summary (Last 24 hours) at 06/15/2018 1307 Last data filed at 06/15/2018 0422 Gross per 24 hour  Intake 468.49 ml  Output 1050 ml  Net -581.51 ml   Filed Weights   06/14/18 2111  Weight: 121.9 kg    Exam  General: Well developed, elderly, chronically ill appearing, NAD  HEENT: NCAT, mucous membranes moist.   Neck: Supple  Cardiovascular: S1 S2 auscultated, RRR  Respiratory: Clear to auscultation bilaterally with equal chest rise  Abdomen: Soft, nontender, nondistended, + bowel sounds  Extremities: warm dry without cyanosis clubbing or edema  Neuro: AAOx3, left sided weakness 3/5, left facial droop   Skin: Without rashes exudates or nodules  Psych: Normal affect and demeanor with intact judgement and insight   Data Reviewed: I have personally reviewed following labs and imaging studies  CBC: Recent Labs  Lab 06/14/18 1648 06/14/18 1717  WBC 9.3  --  NEUTROABS 6.3  --   HGB 12.4* 13.6  HCT 39.7 40.0  MCV 81.9  --   PLT 200  --    Basic Metabolic Panel: Recent Labs  Lab 06/14/18 1648 06/14/18 1717  NA 136 138  K 4.0 4.0  CL 103 102  CO2 24  --   GLUCOSE 291* 286*  BUN 14 15  CREATININE 1.05 1.10  CALCIUM 9.6  --    GFR: Estimated Creatinine Clearance: 61.9 mL/min (by C-G formula based on SCr of 1.1 mg/dL). Liver  Function Tests: Recent Labs  Lab 06/14/18 1648  AST 15  ALT 13  ALKPHOS 59  BILITOT 0.5  PROT 7.1  ALBUMIN 3.4*   No results for input(s): LIPASE, AMYLASE in the last 168 hours. No results for input(s): AMMONIA in the last 168 hours. Coagulation Profile: Recent Labs  Lab 06/14/18 1648  INR 1.00   Cardiac Enzymes: No results for input(s): CKTOTAL, CKMB, CKMBINDEX, TROPONINI in the last 168 hours. BNP (last 3 results) No results for input(s): PROBNP in the last 8760 hours. HbA1C: Recent Labs    06/15/18 0552  HGBA1C 9.8*   CBG: Recent Labs  Lab 06/14/18 1649 06/14/18 2113 06/15/18 0605 06/15/18 1218  GLUCAP 262* 233* 235* 233*   Lipid Profile: Recent Labs    06/15/18 0552  CHOL 224*  HDL 55  LDLCALC 146*  TRIG 115  CHOLHDL 4.1   Thyroid Function Tests: No results for input(s): TSH, T4TOTAL, FREET4, T3FREE, THYROIDAB in the last 72 hours. Anemia Panel: No results for input(s): VITAMINB12, FOLATE, FERRITIN, TIBC, IRON, RETICCTPCT in the last 72 hours. Urine analysis:    Component Value Date/Time   COLORURINE YELLOW 06/14/2018 1648   APPEARANCEUR CLEAR 06/14/2018 1648   LABSPEC 1.010 06/14/2018 1648   PHURINE 5.0 06/14/2018 1648   GLUCOSEU 150 (A) 06/14/2018 1648   HGBUR NEGATIVE 06/14/2018 1648   BILIRUBINUR NEGATIVE 06/14/2018 1648   KETONESUR NEGATIVE 06/14/2018 1648   PROTEINUR 100 (A) 06/14/2018 1648   UROBILINOGEN 0.2 12/13/2014 2011   NITRITE NEGATIVE 06/14/2018 1648   LEUKOCYTESUR NEGATIVE 06/14/2018 1648   Sepsis Labs: @LABRCNTIP (procalcitonin:4,lacticidven:4)  )No results found for this or any previous visit (from the past 240 hour(s)).    Radiology Studies: Dg Chest 2 View  Result Date: 06/14/2018 CLINICAL DATA:  Weakness, difficulty using left hand EXAM: CHEST - 2 VIEW COMPARISON:  03/23/2018 FINDINGS: There is bilateral chronic interstitial thickening. There is no focal parenchymal opacity. There is no pleural effusion or  pneumothorax. The heart and mediastinal contours are unremarkable. The osseous structures are unremarkable. IMPRESSION: No active cardiopulmonary disease. Electronically Signed   By: Elige KoHetal  Patel   On: 06/14/2018 19:32   Mr Brain Wo Contrast  Result Date: 06/15/2018 CLINICAL DATA:  Acute onset confusion and aphasia. Assess for stroke. History of recent Bell's palsy, hypertension, diabetes and stroke. EXAM: MRI HEAD WITHOUT CONTRAST TECHNIQUE: Multiplanar, multiecho pulse sequences of the brain and surrounding structures were obtained without intravenous contrast. COMPARISON:  CT HEAD June 14, 2018 and MRI head March 23, 2018 FINDINGS: INTRACRANIAL CONTENTS: 11 x 13 x 14 mm LEFT posterior thalamus reduced diffusion with low ADC values. 4 mm reduced diffusion LEFT pons with low ADC values. No susceptibility artifact to suggest hemorrhage. No advanced parenchymal brain volume loss for age. No hydrocephalus. Old small RIGHT basal ganglia infarcts. Patchy to confluent supratentorial and pontine white matter FLAIR T2 hyperintensities. No midline shift, mass effect or masses. No abnormal extra-axial fluid collections. VASCULAR: Similar poor  flow related enhancement basilar artery. SKULL AND UPPER CERVICAL SPINE: No abnormal sellar expansion. No suspicious calvarial bone marrow signal. Craniocervical junction maintained. Partially imaged small retroauricular lipoma. SINUSES/ORBITS: Small LEFT maxillary mucosal retention cyst and trace paranasal sinus mucosal thickening. Minimal LEFT mastoid effusion.The included ocular globes and orbital contents are non-suspicious. Status post RIGHT ocular lens implant. OTHER: None. IMPRESSION: 1. Acute 14 mm LEFT thalamus and 4 mm LEFT pons nonhemorrhagic infarcts. 2. Similar moderate to severe chronic small vessel ischemic changes and old RIGHT basal ganglia infarcts. 3. Chronically attenuated basilar artery. 4. These results will be called to the ordering clinician or  representative by the Radiologist Assistant, and communication documented in the PACS or zVision Dashboard. Electronically Signed   By: Awilda Metro M.D.   On: 06/15/2018 05:29   Ct Head Code Stroke Wo Contrast  Result Date: 06/14/2018 CLINICAL DATA:  Code stroke. Slurred speech, aphasia and confusion. Left facial droop. EXAM: CT HEAD WITHOUT CONTRAST TECHNIQUE: Contiguous axial images were obtained from the base of the skull through the vertex without intravenous contrast. COMPARISON:  Head CT 03/24/2018 FINDINGS: Brain: There is no mass, hemorrhage or extra-axial collection. There is generalized atrophy without lobar predilection. There is hypoattenuation of the periventricular white matter, most commonly indicating chronic ischemic microangiopathy. Vascular: No abnormal hyperdensity of the major intracranial arteries or dural venous sinuses. No intracranial atherosclerosis. Skull: The visualized skull base, calvarium and extracranial soft tissues are normal. Sinuses/Orbits: No fluid levels or advanced mucosal thickening of the visualized paranasal sinuses. No mastoid or middle ear effusion. The orbits are normal. ASPECTS [facility] Stroke Program Early CT Score) - Ganglionic level infarction (caudate, lentiform nuclei, internal capsule, insula, M1-M3 cortex): 7 - Supraganglionic infarction (M4-M6 cortex): 3 Total score (0-10 with 10 being normal): 10 IMPRESSION: 1. No acute hemorrhage. 2. ASPECTS is 10. These results were communicated to Dr. Georgiana Spinner Aroor at 5:13 pm on 06/14/2018 by text page via the [facility] messaging system. Electronically Signed   By: Deatra Robinson M.D.   On: 06/14/2018 17:14     Scheduled Meds: . acetaminophen  500 mg Oral Daily  . apixaban  2.5 mg Oral BID  . cholecalciferol  500 Units Oral QHS  . glipiZIDE  10 mg Oral BID AC  . pantoprazole  40 mg Oral Daily  . senna-docusate  2 tablet Oral Daily   Continuous Infusions: . sodium chloride 100 mL/hr at 06/15/18 1208      LOS: 1 day   Time Spent in minutes   30 minutes  Freddy Spadafora D.O. on 06/15/2018 at 1:07 PM  Between 7am to 7pm - Please see pager noted on [email or web address]  After 7pm go to [email or web address]  And look for the night coverage person covering for me after hours  [facility] Office  [phone]

## 2018-06-15 NOTE — Care Management Note (Signed)
Case Management Note  Patient Details  Name: Rosalyn GessLawrence Konieczny MRN: [id number] Date of Birth: 1924/09/17  Subjective/Objective:     Pt admitted with CVA. He is from home with spouse. [facility]. DME: bed, wheelchair, all other needed DME.               Action/Plan: [facility] OT recommending [facility]F. CM following for needs, physician orders. Pt will need HH orders and F2F if returns home for resumption of services.  Expected Discharge Date:                  Expected Discharge Plan:  Home w Home Health Services  In-House Referral:     Discharge planning Services  CM Consult  Post Acute Care Choice:    Choice offered to:     DME Arranged:    DME Agency:     HH Arranged:    HH Agency:     Status of Service:  In process, will continue to follow  If discussed at Long Length of Stay Meetings, dates discussed:    Additional Comments:  Kermit BaloKelli F Wyatte Dames, RN 06/15/2018, 12:11 PM

## 2018-06-15 NOTE — Progress Notes (Signed)
[facility]  Patient Status: Active (receiving services up to time of hospitalization)  [facility] is providing the following services: PT and OT  If patient discharges after hours, please call [phone].   Kizzie FurnishDonna Fellmy 06/15/2018, 1:09 PM

## 2018-06-15 NOTE — Progress Notes (Addendum)
STROKE TEAM PROGRESS NOTE   INTERVAL HISTORY His daughter Tom Wang is at the bedside.  He is just back from testing, very sleepy (he did not get to sleep until 2 AM, then was woken at 4 AM).   Vitals:   06/14/18 2200 06/15/18 0000 06/15/18 0200 06/15/18 0400  BP: (!) 180/72 (!) 178/75 (!) 163/68 (!) 144/66  Pulse:  (!) 55 (!) 57   Resp: 18 18 18 18   Temp: 97.9 F (36.6 C) 98 F (36.7 C) 98.2 F (36.8 C) 98.2 F (36.8 C)  TempSrc: Oral Oral Oral Oral  SpO2: 98% 97% 97% 95%  Weight:      Height:        CBC:  Recent Labs  Lab 06/14/18 1648 06/14/18 1717  WBC 9.3  --   NEUTROABS 6.3  --   HGB 12.4* 13.6  HCT 39.7 40.0  MCV 81.9  --   PLT 200  --     Basic Metabolic Panel:  Recent Labs  Lab 06/14/18 1648 06/14/18 1717  NA 136 138  K 4.0 4.0  CL 103 102  CO2 24  --   GLUCOSE 291* 286*  BUN 14 15  CREATININE 1.05 1.10  CALCIUM 9.6  --    Lipid Panel:     Component Value Date/Time   CHOL 224 (H) 06/15/2018 0552   TRIG 115 06/15/2018 0552   HDL 55 06/15/2018 0552   CHOLHDL 4.1 06/15/2018 0552   VLDL 23 06/15/2018 0552   LDLCALC 146 (H) 06/15/2018 0552   HgbA1c:  Lab Results  Component Value Date   HGBA1C 9.8 (H) 06/15/2018   Urine Drug Screen:     Component Value Date/Time   LABOPIA NONE DETECTED 06/14/2018 1648   COCAINSCRNUR NONE DETECTED 06/14/2018 1648   LABBENZ NONE DETECTED 06/14/2018 1648   AMPHETMU NONE DETECTED 06/14/2018 1648   THCU NONE DETECTED 06/14/2018 1648   LABBARB NONE DETECTED 06/14/2018 1648    Alcohol Level     Component Value Date/Time   ETH <10 06/14/2018 1648    IMAGING Dg Chest 2 View  Result Date: 06/14/2018 CLINICAL DATA:  Weakness, difficulty using left hand EXAM: CHEST - 2 VIEW COMPARISON:  03/23/2018 FINDINGS: There is bilateral chronic interstitial thickening. There is no focal parenchymal opacity. There is no pleural effusion or pneumothorax. The heart and mediastinal contours are unremarkable. The osseous  structures are unremarkable. IMPRESSION: No active cardiopulmonary disease. Electronically Signed   By: Elige Ko   On: 06/14/2018 19:32   Mr Brain Wo Contrast  Result Date: 06/15/2018 CLINICAL DATA:  Acute onset confusion and aphasia. Assess for stroke. History of recent Bell's palsy, hypertension, diabetes and stroke. EXAM: MRI HEAD WITHOUT CONTRAST TECHNIQUE: Multiplanar, multiecho pulse sequences of the brain and surrounding structures were obtained without intravenous contrast. COMPARISON:  CT HEAD June 14, 2018 and MRI head March 23, 2018 FINDINGS: INTRACRANIAL CONTENTS: 11 x 13 x 14 mm LEFT posterior thalamus reduced diffusion with low ADC values. 4 mm reduced diffusion LEFT pons with low ADC values. No susceptibility artifact to suggest hemorrhage. No advanced parenchymal brain volume loss for age. No hydrocephalus. Old small RIGHT basal ganglia infarcts. Patchy to confluent supratentorial and pontine white matter FLAIR T2 hyperintensities. No midline shift, mass effect or masses. No abnormal extra-axial fluid collections. VASCULAR: Similar poor flow related enhancement basilar artery. SKULL AND UPPER CERVICAL SPINE: No abnormal sellar expansion. No suspicious calvarial bone marrow signal. Craniocervical junction maintained. Partially imaged small retroauricular lipoma. SINUSES/ORBITS: Small  LEFT maxillary mucosal retention cyst and trace paranasal sinus mucosal thickening. Minimal LEFT mastoid effusion.The included ocular globes and orbital contents are non-suspicious. Status post RIGHT ocular lens implant. OTHER: None. IMPRESSION: 1. Acute 14 mm LEFT thalamus and 4 mm LEFT pons nonhemorrhagic infarcts. 2. Similar moderate to severe chronic small vessel ischemic changes and old RIGHT basal ganglia infarcts. 3. Chronically attenuated basilar artery. 4. These results will be called to the ordering clinician or representative by the Radiologist Assistant, and communication documented in the PACS  or zVision Dashboard. Electronically Signed   By: Awilda Metroourtnay  Bloomer M.D.   On: 06/15/2018 05:29   Ct Head Code Stroke Wo Contrast  Result Date: 06/14/2018 CLINICAL DATA:  Code stroke. Slurred speech, aphasia and confusion. Left facial droop. EXAM: CT HEAD WITHOUT CONTRAST TECHNIQUE: Contiguous axial images were obtained from the base of the skull through the vertex without intravenous contrast. COMPARISON:  Head CT 03/24/2018 FINDINGS: Brain: There is no mass, hemorrhage or extra-axial collection. There is generalized atrophy without lobar predilection. There is hypoattenuation of the periventricular white matter, most commonly indicating chronic ischemic microangiopathy. Vascular: No abnormal hyperdensity of the major intracranial arteries or dural venous sinuses. No intracranial atherosclerosis. Skull: The visualized skull base, calvarium and extracranial soft tissues are normal. Sinuses/Orbits: No fluid levels or advanced mucosal thickening of the visualized paranasal sinuses. No mastoid or middle ear effusion. The orbits are normal. ASPECTS [facility]([place] Stroke Program Early CT Score) - Ganglionic level infarction (caudate, lentiform nuclei, internal capsule, insula, M1-M3 cortex): 7 - Supraganglionic infarction (M4-M6 cortex): 3 Total score (0-10 with 10 being normal): 10 IMPRESSION: 1. No acute hemorrhage. 2. ASPECTS is 10. These results were communicated to Dr. Georgiana SpinnerSushanth Aroor at 5:13 pm on 06/14/2018 by text page via the [facility] messaging system. Electronically Signed   By: Deatra RobinsonKevin  Herman M.D.   On: 06/14/2018 17:14   2D echocardiogram - Left ventricle: The cavity size was normal. Wall thickness was   increased in a pattern of mild LVH. There was focal basal   hypertrophy. Systolic function was vigorous. The estimated   ejection fraction was in the range of 65% to 70%. Wall motion was   normal; there were no regional wall motion abnormalities. Doppler   parameters are consistent with abnormal left  ventricular   relaxation (grade 1 diastolic dysfunction).   PHYSICAL EXAM Elderly African-American male was quite sleepy and not cooperative for exam. . Afebrile. Head is nontraumatic. Neck is supple without bruit.    Cardiac exam no murmur or gallop. Lungs are clear to auscultation. Distal pulses are well felt. Neurological Exam :   drowsy and can be barely aroused. Speech appears clear but his disoriented. Follows simple commands only. Is not cooperative for eye movement testing. He has left facial weakness likely from his old Bell's palsy. Tongue is midline. Motor system exam limited due to lack of cooperation. He has antigravity strength in the right upper extremity. His mild weakness in the left upper extremity 2-3/5. He has weakness in both lower extremities and is unable to lift them off the bed. He can recall his toes. Tone is increased on the left side compared to the right.sensation and coordination cannot be reliably tested. Gait not tested. ASSESSMENT/PLAN Tom Wang is a 84 y.o. 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   ASPECTS 10.     MRI  L thalamic and L pontine infarct.  Similar moderate to severe small vessel disease.  Old R basal ganglia infarcts.  Chronically attenuated BA.  CTA head & neck (in August) severe posterior circulation disease.  Small BA. (no acute stroke on MRI at that time)  2D Echo EF 65 to 70%, no source of embolus  LDL 146  HgbA1c 9.8  Eliquis for VTE prophylaxis  no aspirin (allergic to aspirin per dtr) prior to admission, on Eliquis 2.5 bid PTA, during August admission had discussed Eliquis dosing was not optimal.  Asked him to follow-up  with his primary care physician.  On admission today remains on Eliquis 2.5 mg twice daily.  This has been continued in hospital  Therapy recommendations:  SNF vs home with max assist (uses stand up lift at home, [facility] health PT and [facility])  Disposition:  pending   Pt followed at [facility] in [place]  Hypertension  Stable . Permissive hypertension (OK if < 220/120) but gradually normalize in 5-7 days . Long-term BP goal normotensive  Hyperlipidemia  Home meds: No statin -had been on Zocor 20 in August 2019  Had asked for PCP follow-up for increasing dosage  LDL 146, goal < 70  Now on Lipitor 40 mg daily  Continue statin at discharge  Diabetes type II  HgbA1c 9.8, goal < 7.0  Uncontrolled  Other Stroke Risk Factors  Advanced age  Obesity, Body mass index is 34.5 kg/m., recommend weight loss, diet and exercise as appropriate   Hx stroke/TIA  03/2018 recrudescence of old stroke - presented with increased slurred speech  2014 - right basal ganglia infarct with resultant left hemiparesis, left facial droop, left arm weakness, bilateral weakness requiring wheelchair bound status - due to combination of severe LBP - pt was bedbound at home   Other Active Problems  Hx Bell's Palsy  DVT left lower extremity on Eliquis 2.5 mg twice daily.  See note on Eliquis above  Hospital day # 1  Annie Main, MSN, APRN, ANVP-BC, AGPCNP-BC Advanced Practice Stroke Nurse [facility] See Amion for Schedule & Pager information 06/15/2018 12:02 PM  I have personally examined this patient, reviewed notes, independently viewed imaging studies, participated in medical decision making and plan of care.ROS completed by me personally and pertinent positives fully documented  I have made any additions or clarifications directly to the above note. Agree with note above. he has presented with new pontine and thalamic infarcts from small vessel disease. His poor  neurological baseline with left hemiparesis from a prior stroke from 2014 and is essentially bedbound at baseline. It is unclear to me as to why he is on eliquis for anticoagulation as history of definite DVT or A. Fib is not documented. If no definite long-term need for eliquis is found discontinue it and switch to aspirin. Long discussion with the daughter the bedside as well as with Dr. Catha Gosselin. Greater than 50% time during this 35 minute visit was spent on counseling and coordination of care of his lacunar infarct  In discussion about need for anticoagulation and answered questions. Delia Heady, MD Medical Director [facility] Pager: [phone] 06/15/2018 6:42 PM  To contact Stroke Continuity provider, please refer to [email or web address]. After hours, contact General Neurology

## 2018-06-15 NOTE — Evaluation (Signed)
Occupational Therapy Evaluation Patient Details Name: Tom Wang MRN: [id number] DOB: 07-31-1925 Today's Date: 06/15/2018    History of Present Illness Pt is a 84 y.o male with a PMH consisting of DM, HTN, Bell's palsy, and CVA with residual R sided weakness presents to the ED with complaints of abnormal speech and weakness. MRI on 06/15/2018 showed acute L thalamic and pons infarct, as well as chronic R basal ganglia infarcts.    Clinical Impression   Pt admitted as above with deficits in ability to assist with self care tasks as he was previously able per daughter report (please see OT problem list below for details). PTA, pt family uses lift for all transfer to manual wheelchair/toilet/bed, pt was able to self propel w/c with BUE. Prior to this event, he was also able to assist some with ADL's, including bathing/grooming. Pt family sponge bathes pt at bedlevel. Pt currently lives at home with family & CNA 5x week, has a Haematologist. Pt was overall +2-3+ max A for all bed mobility during ADL's at this time. Will follow acutely for OT.    Follow Up Recommendations  SNF;Supervision/Assistance - 24 hour;Other (comment)(Pt family may prefer to take pt home where he has CNA 5x/week and family offering 24/7 max-total assist)    Equipment Recommendations  Other (comment)(TBD)    Recommendations for Other Services       Precautions / Restrictions Precautions Precautions: Fall Restrictions Weight Bearing Restrictions: No Other Position/Activity Restrictions: Per family pt uses lift at home, he does not do SPT      Mobility Bed Mobility Overal bed mobility: Needs Assistance Bed Mobility: Rolling Rolling: Max assist;+2 for physical assistance   Supine to sit: Max assist;+2 for physical assistance Sit to supine: Max assist;+2 for physical assistance   General bed mobility comments: +3 max A with daughter over eager to help and wanted to provided assistance. . Pt required  multimodal cueing throughout mobility. Pt required BLE assistance and trunk elevation. Pt unable to initiate movement in BLE. Pt able to initiate UE during rolling.    Transfers Overall transfer level: (Deferred. Pt only transfers with lift at baseline)               General transfer comment: Pt is not safe for transfers. Uses lift at baseline for bed to w/c or bed to chair/toilet per pt daughter report.    Balance Overall balance assessment: Needs assistance Sitting-balance support: Bilateral upper extremity supported;Feet supported Sitting balance-Leahy Scale: Poor Sitting balance - Comments: Pt unable to maintain sitting balance without mod A trunk support and BUE support.                                    ADL either performed or assessed with clinical judgement   ADL Overall ADL's : Needs assistance/impaired Eating/Feeding: Set up;Bed level;Maximal assistance   Grooming: Wash/dry face;Bed level;Maximal assistance   Upper Body Bathing: Total assistance;Bed level;With caregiver independent assisting   Lower Body Bathing: Total assistance;+2 for physical assistance;+2 for safety/equipment;Bed level   Upper Body Dressing : Maximal assistance;Bed level   Lower Body Dressing: Total assistance;+2 for physical assistance;+2 for safety/equipment;Bed level   Toilet Transfer: (NT - history per daughter/caregiver report) Toilet Transfer Details (indicate cue type and reason): +2 total assist using lift. Pt family uses lift for all transfers at home. Pt does not WB through LE's and does not do SPT. Lift to  w/c to commode Toileting- Clothing Manipulation and Hygiene: Total assistance;+2 for physical assistance;+2 for safety/equipment;Bed level     Tub/Shower Transfer Details (indicate cue type and reason): Pt does not get into shower/tub, family and CNA's give bed baths. Pt was +2 total assist today bed level including rolling side to side for sponge bathing. Functional  mobility during ADLs: Total assistance;+2 for safety/equipment;+2 for physical assistance(Pt family and care staff use lift for all transfers at baseline) General ADL Comments: Pt seen for OT assessment followed by gentle AROM bilateral UE, shoulders, forearms, hands wrists, digits for flexion/extension, composite fist and extension, forearm pronation/supination x5 reps each as pt was attempting to rest following bathing/ADL's at bed level. He requires +2 total assist for bed mobility and sponge bathing noted.      Vision Baseline Vision/History: Wears glasses Wears Glasses: Reading only Patient Visual Report: No change from baseline       Perception     Praxis      Pertinent Vitals/Pain Pain Assessment: Faces Faces Pain Scale: Hurts little more Pain Location: Generalized, BLE   Pain Descriptors / Indicators: Grimacing;Guarding Pain Intervention(s): Monitored during session;Limited activity within patient's tolerance     Hand Dominance Right   Extremity/Trunk Assessment Upper Extremity Assessment Upper Extremity Assessment: Generalized weakness;RUE deficits/detail;LUE deficits/detail RUE Deficits / Details: Per pt daughter, pt is R hand dominant. Pt daughter reports decreased use of R hand in general RUE: (WFL's/generalized weakness) RUE Sensation: (Intact to light touch digits 1-5) RUE Coordination: decreased fine motor;decreased gross motor LUE Deficits / Details: Previous CVA w/ h/o generalized weakness per daughter/caregiver. Non-dominant hand LUE Sensation: (WFL's/generalized weakness) LUE Coordination: decreased fine motor;decreased gross motor   Lower Extremity Assessment Lower Extremity Assessment: Defer to PT evaluation RLE Deficits / Details: Standardized MMT unable to complete secondary to cognitive deficits. However, no muscle contraction noted for RLE throughout session. Restricted in hip flexion PROM secondary to pain. Decreased sensation overall of RLE compared to  the LLE   RLE Sensation: decreased light touch RLE Coordination: decreased fine motor;decreased gross motor LLE Deficits / Details: Standardized MMT unable to complete secondary to cognitive deficits. However, no muscle contraction noted for LLE throughout session. Restricted in hip flexion PROM secondary to pain  LLE Sensation: WNL LLE Coordination: decreased fine motor;decreased gross motor       Communication Communication Communication: Expressive difficulties;HOH   Cognition Arousal/Alertness: Awake/alert Behavior During Therapy: Anxious Overall Cognitive Status: Impaired/Different from baseline Area of Impairment: Attention;Following commands;Awareness;Problem solving;Memory                   Current Attention Level: Sustained Memory: Decreased short-term memory Following Commands: Follows one step commands inconsistently;Follows one step commands with increased time   Awareness: Intellectual Problem Solving: Slow processing;Decreased initiation;Requires tactile cues;Requires verbal cues;Difficulty sequencing     General Comments  Ulcerations L dorsum of foot/toes RN is aware and monitoring.    Exercises     Shoulder Instructions      Home Living Family/patient expects to be discharged to:: Private residence Living Arrangements: Children Available Help at Discharge: Family;Available 24 hours/day Type of Home: House Home Access: Level entry     Home Layout: One level     Bathroom Shower/Tub: Other (comment)(Sponge bathes)   Bathroom Toilet: Handicapped height Bathroom Accessibility: Yes   Home Equipment: Wheelchair - manual;Hospital bed;Other (comment)(Sara Plus Lift)   Additional Comments: CNA comes 5x week for 3 hours       Prior Functioning/Environment Level of Independence: Needs assistance  Gait / Transfers Assistance Needed: Manual wheelchair that is self propelled by BUE required transfers with sara plus  ADL's / Homemaking Assistance  Needed: Dependent for all ADL's             OT Problem List: Decreased strength;Decreased activity tolerance;Impaired UE functional use;Decreased coordination;Pain      OT Treatment/Interventions: Self-care/ADL training;DME and/or AE instruction;Therapeutic activities;Therapeutic exercise;Patient/family education    OT Goals(Current goals can be found in the care plan section) Acute Rehab OT Goals Patient Stated Goal: To return home to be taken care of by family OT Goal Formulation: With family Time For Goal Achievement: 06/29/18 Potential to Achieve Goals: Fair  OT Frequency: Min 2X/week   Barriers to D/C:            Co-evaluation              AM-PAC PT "6 Clicks" Daily Activity     Outcome Measure Help from another person eating meals?: A Lot Help from another person taking care of personal grooming?: Total Help from another person toileting, which includes using toliet, bedpan, or urinal?: Total Help from another person bathing (including washing, rinsing, drying)?: Total Help from another person to put on and taking off regular upper body clothing?: Total Help from another person to put on and taking off regular lower body clothing?: Total 6 Click Score: 7   End of Session    Activity Tolerance: Patient limited by pain;Patient limited by fatigue Patient left: in bed;with call bell/phone within reach;with bed alarm set;with family/visitor present  OT Visit Diagnosis: Muscle weakness (generalized) (M62.81);Pain;Other symptoms and signs involving the nervous system (R29.898) Pain - Right/Left: (Generalized weakness/pain "all over")                Time: 1610-9604 OT Time Calculation (min): 26 min Charges:  OT General Charges $OT Visit: 1 Visit OT Evaluation $OT Eval Moderate Complexity: 1 Mod OT Treatments $Self Care/Home Management : 8-22 mins   [place], Amy Beth Dixon, OTR/L 06/15/2018, 11:01 AM

## 2018-06-15 NOTE — Consult Note (Signed)
WOC Nurse wound consult note [facility] in the presence of his daughter, Dorann LodgeJuanita. Reason for Consult: Left great toe and toes 2 & 3 Wound type: Diabetic foot ulcers.  The patient's daughter, Dorann LodgeJuanita, states that since she started doing the wound care to these three toes they have greatly improved. She states she washes them with soap and water. Applies Aloe cream and OTC antifungal powder and leaves them open to air.  Today, there are no open wounds to the great toe or the 3rd toe.  There is one very tiny opening to the 2nd toe.  There is no drainage, odor, or erythema. Dressing procedure/placement/frequency: For both shifts today, nurses have orders to apply betadine and allow it to air dry and leave open to air.  Beginning tomorrow, wound care will be resumed by [place] using the OTC products she has been using.  Her approach has given very favorable results so far. Monitor the wound area(s) for worsening of condition such as: Signs/symptoms of infection,  Increase in size,  Development of or worsening of odor, Development of pain, or increased pain at the affected locations.  Notify the medical team if any of these develop.  Thank you for the consult.  Discussed plan of care with the patient and bedside nurse.  WOC nurse will not follow at this time.  Please re-consult the WOC team if needed.  Helmut MusterSherry Aerica Rincon, RN, MSN, CWOCN, CNS-BC, pager [phone]

## 2018-06-15 NOTE — Plan of Care (Signed)
°  Problem: Education: °Goal: Knowledge of General Education information will improve °Description: Including pain rating scale, medication(s)/side effects and non-pharmacologic comfort measures °Outcome: Progressing °  °Problem: Clinical Measurements: °Goal: Ability to maintain clinical measurements within normal limits will improve °Outcome: Progressing °Goal: Will remain free from infection °Outcome: Progressing °Goal: Respiratory complications will improve °Outcome: Progressing °  °Problem: Safety: °Goal: Ability to remain free from injury will improve °Outcome: Progressing °  °Problem: Skin Integrity: °Goal: Risk for impaired skin integrity will decrease °Outcome: Progressing °  °

## 2018-06-16 ENCOUNTER — Inpatient Hospital Stay (HOSPITAL_COMMUNITY): Payer: Medicare HMO

## 2018-06-16 DIAGNOSIS — I639 Cerebral infarction, unspecified: Secondary | ICD-10-CM

## 2018-06-16 LAB — GLUCOSE, CAPILLARY
GLUCOSE-CAPILLARY: 216 mg/dL — AB (ref 70–99)
GLUCOSE-CAPILLARY: 189 mg/dL — AB (ref 70–99)
Glucose-Capillary: 253 mg/dL — ABNORMAL HIGH (ref 70–99)
Glucose-Capillary: 219 mg/dL — ABNORMAL HIGH (ref 70–99)

## 2018-06-16 MED ORDER — BRIMONIDINE TARTRATE 0.15 % OP SOLN
1.0000 [drp] | Freq: Two times a day (BID) | OPHTHALMIC | Status: DC
  Filled 2018-06-16: qty 5

## 2018-06-16 MED ORDER — ASPIRIN 325 MG PO TABS
325.0000 mg | ORAL_TABLET | Freq: Every day | ORAL | Status: DC
  Administered 2018-06-16 – 2018-06-17 (×2): 325 mg via ORAL
  Filled 2018-06-16 (×2): qty 1

## 2018-06-16 MED ORDER — BRIMONIDINE TARTRATE 0.1 % OP SOLN
1.0000 [drp] | Freq: Two times a day (BID) | OPHTHALMIC | Status: DC
  Administered 2018-06-16 – 2018-06-17 (×3): 1 [drp] via OPHTHALMIC

## 2018-06-16 NOTE — Progress Notes (Signed)
*  Preliminary Results* Bilateral lower extremity venous duplex completed. Bilateral lower extremities are negative for deep vein thrombosis. There is no evidence of Baker's cyst bilaterally.  06/16/2018 1:50 PM  Blanch MediaMegan Eller Sweis, RVS

## 2018-06-16 NOTE — Progress Notes (Signed)
Physical Therapy Treatment Patient Details Name: Tom Wang MRN: [id number] DOB: 1924-09-19 Today's Date: 06/16/2018    History of Present Illness Pt is a 84 y.o male with a PMH consisting of DM, HTN, Bell's palsy, and CVA with residual R sided weakness presents to the ED with complaints of abnormal speech and weakness. MRI on 06/15/2018 showed acute L thalamic and pons infarct, as well as chronic R basal ganglia infarcts.     PT Comments    Pt presents HOB elevated, supine, and asleep. Pt remains in lethargic state throughout session, with only occasional eye opening despite a variety of tactile, kinetic and auditory stimuli. Pt does not give any verbal communication during session, which is different from on 06/15/2018. PROM completed to all extremities at al joints in all planes of motion. Pt with increased guarding in both LUE and LLE. Continued recommendation of HHPT due to strong family desire to bring pt home, and family owning the necessary equipment to care for the pt. However, 24/7 supervision will be needed for all mobility and ADL's. Pt would continue to benefit from skilled PT in order to increase strength and functional mobility.      Follow Up Recommendations  Home health PT;Supervision/Assistance - 24 hour     Equipment Recommendations  None recommended by PT    Recommendations for Other Services       Precautions / Restrictions Precautions Precautions: Fall Restrictions Weight Bearing Restrictions: No    Mobility  Bed Mobility Overal bed mobility: (deferred)                Transfers                    Ambulation/Gait                 Stairs             Wheelchair Mobility    Modified Rankin (Stroke Patients Only) Modified Rankin (Stroke Patients Only) Pre-Morbid Rankin Score: Moderately severe disability Modified Rankin: Severe disability     Balance Overall balance assessment: (deferred)                                           Cognition Arousal/Alertness: Lethargic Behavior During Therapy: Flat affect Overall Cognitive Status: Difficult to assess Area of Impairment: Awareness                           Awareness: Intellectual   General Comments: Pt remained lethargic despite a variety of tactile, kinetic and auditory stimuli throughout the session. No verbal words were spoken.      Exercises General Exercises - Upper Extremity Shoulder Flexion: PROM;Both;10 reps Elbow Flexion: PROM;Both;10 reps Elbow Extension: PROM;Both;10 reps Wrist Flexion: PROM;Both;5 reps Wrist Extension: PROM;Both;5 reps General Exercises - Lower Extremity Heel Slides: PROM;Both;10 reps Hip ABduction/ADduction: PROM;Both;5 reps Hip Flexion/Marching: PROM;Both;10 reps    General Comments        Pertinent Vitals/Pain Pain Assessment: Faces Faces Pain Scale: Hurts little more Pain Location: LUE/LLE Pain Descriptors / Indicators: Grimacing;Guarding Pain Intervention(s): Limited activity within patient's tolerance;Monitored during session    Home Living                      Prior Function            PT Goals (current  goals can now be found in the care plan section) Acute Rehab PT Goals Patient Stated Goal: To return home to be taken care of by family PT Goal Formulation: With patient/family Time For Goal Achievement: 06/29/18 Potential to Achieve Goals: Fair Progress towards PT goals: Progressing toward goals    Frequency    Min 4X/week      PT Plan Current plan remains appropriate    Co-evaluation              AM-PAC PT "6 Clicks" Daily Activity  Outcome Measure  Difficulty turning over in bed (including adjusting bedclothes, sheets and blankets)?: Unable Difficulty moving from lying on back to sitting on the side of the bed? : Unable Difficulty sitting down on and standing up from a chair with arms (e.g., wheelchair, bedside commode, etc,.)?:  Unable Help needed moving to and from a bed to chair (including a wheelchair)?: Total Help needed walking in hospital room?: Total Help needed climbing 3-5 steps with a railing? : Total 6 Click Score: 6    End of Session   Activity Tolerance: Patient limited by lethargy Patient left: in bed;with call bell/phone within reach;with bed alarm set;with family/visitor present;with nursing/sitter in room Nurse Communication: Mobility status PT Visit Diagnosis: Muscle weakness (generalized) (M62.81);Other abnormalities of gait and mobility (R26.89)     Time: [id number] PT Time Calculation (min) (ACUTE ONLY): 24 min  Charges:  $Therapeutic Exercise: 23-37 mins                     Tom Wang, SPT Acute Rehab [phone] (pager) [phone] (office)   Tom Wang 06/16/2018, 2:06 PM

## 2018-06-16 NOTE — Progress Notes (Signed)
PT Progress Note for Charges    06/16/18 1400  PT General Charges  $$ ACUTE PT VISIT 1 Visit  PT Treatments  $Therapeutic Exercise 23-37 mins  Deborah ChalkJennifer Burney [place], [place]PT, DPT  Acute Rehabilitation Services Pager [phone] Office [phone]

## 2018-06-16 NOTE — Care Management Note (Signed)
Case Management Note  Patient Details  Name: Tom Wang MRN: [id number] Date of Birth: 1925-02-05  Subjective/Objective:                    Action/Plan: Spoke with daughter and granddaughter and the plan is for patient to d/c home. [facility] and want to continue with them. MD please place F2F and HH orders.  Family will arrange transport home they just need to be informed so they can arrange.   Expected Discharge Date:                  Expected Discharge Plan:  Home w Home Health Services  In-House Referral:     Discharge planning Services  CM Consult  Post Acute Care Choice:  Home Health Choice offered to:     DME Arranged:    DME Agency:     HH Arranged:    HH Agency:  [facility]  Status of Service:  In process, will continue to follow  If discussed at Long Length of Stay Meetings, dates discussed:    Additional Comments:  Kermit BaloKelli F Rodriques Badie, RN 06/16/2018, 2:41 PM

## 2018-06-16 NOTE — Progress Notes (Signed)
PROGRESS NOTE    Tom Wang  ZOX:[id number] DOB: May 31, 1925 DOA: 06/14/2018 PCP: [name]   Brief Narrative:  HPI on 06/14/2018 by Dr. [name] is a 84 y.o. male with medical history significant of diabetes, hypertension, recent Bell's palsy, GERD, previous CVAs who was brought in by family today due to sudden onset of abnormal speech and aphasia with confusion.  Symptoms occurred around 2:30 in the afternoon.  Patient was apparently coming from his ophthalmologist where he had some eyedrops in his eyes.  Family initially thought it was reaction to the medications.  He was unable to identify wife and daughter.  They brought him to the ER where his speech returned spontaneously around 4:00.  He has been evaluated in the ER and by neurology.  Suspected recurrent CVA however this could also relate to some infection or drug reaction.  Patient is currently awake and communicative but is weak and drowsy.  He is being admitted for CVA work-up.  Interim history Admitted for CVA. Neurology following.  Assessment & Plan   Acute CVA -Presented with abnormal speech -CT head no acute hemorrhage -MRI brain: Acute 14mm left thalamus and left pons nonhemorrhagic infarcts -Hemoglobin A1c 9.8, LDL146 -Echocardiogram EF 65-70%, G1DD -PT recommended [facility] -neurology consulted and appreciated- pending recommendations -started on lipitor (low dose given advanced age and possible side effects) -lower extremity Doppler negative for DVT -Discussed with neurology, Dr. [name] to review records from the [facility] hospital to see if patient ever had diagnosis of pulmonary embolism.  If no PE, would discontinue Eliquis and place patient on full dose aspirin daily. -Spent 30+ minutes reviewing patient's records from the [place] along with granddaughter at bedside and discussing plan. No mention of PE in records presented to [place]. Granddaughter does not recall patient having a CT  of chest or being told he had a clot in his lungs. Given this information, will place patient on full dose aspirin. Discussed signs and symptoms of PE and DVT. Granddaughter voiced understanding.    Essential hypertension -allowing for permissive HTN  Hyperlipidemia -Continue statin  Diabetes mellitus, type II -Continue glipizide, patient refuses to take insulin due to allergy -hemoglobin A1c 9.6 (will need to follow up with PCP for better CBG control) -Monitor CBGs  GERD -Continue PPI  DVT Prophylaxis  Eliquis  Code Status: Full  Family Communication:  Daughter at bedside  Disposition Plan: Admitted. Pending further neuro recommendations  Consultants Neurology   Procedures  Echocardiogram Lower extremity doppler  Antibiotics   Anti-infectives (From admission, onward)   None      Subjective:   [name] seen and examined today.  Has no complaints. Denies chest pain, shortness of breath, abdominal pain, N/V/D/C.   Objective:   Vitals:   06/16/18 0418 06/16/18 0745 06/16/18 1155 06/16/18 1600  BP: (!) 190/69 (!) 194/86 (!) 190/81 (!) 188/69  Pulse: 66 (!) 59 (!) 52 (!) 49  Resp: 18 20 16 16   Temp: 99.1 F (37.3 C) 99.3 F (37.4 C) 98.4 F (36.9 C) 97.7 F (36.5 C)  TempSrc: Axillary Axillary Oral Oral  SpO2: 97% 97% 95% 98%  Weight:      Height:        Intake/Output Summary (Last 24 hours) at 06/16/2018 1642 Last data filed at 06/16/2018 1259 Gross per 24 hour  Intake 2863.82 ml  Output 1200 ml  Net 1663.82 ml   Filed Weights   06/14/18 2111  Weight: 121.9 kg   Exam  General: Well developed, elderly, chronically ill appearing, NAD  HEENT: NCAT, mucous membranes moist.   Neck: Supple  Cardiovascular: S1 S2 auscultated, RRR  Respiratory: Clear to auscultation bilaterally with equal chest rise  Abdomen: Soft, nontender, nondistended, + bowel sounds  Extremities: warm dry without cyanosis clubbing or edema  Neuro: AAOx3, left sided  weakness, facial droop  Psych: Normal affect and demeanor  Data Reviewed: I have personally reviewed following labs and imaging studies  CBC: Recent Labs  Lab 06/14/18 1648 06/14/18 1717  WBC 9.3  --   NEUTROABS 6.3  --   HGB 12.4* 13.6  HCT 39.7 40.0  MCV 81.9  --   PLT 200  --    Basic Metabolic Panel: Recent Labs  Lab 06/14/18 1648 06/14/18 1717  NA 136 138  K 4.0 4.0  CL 103 102  CO2 24  --   GLUCOSE 291* 286*  BUN 14 15  CREATININE 1.05 1.10  CALCIUM 9.6  --    GFR: Estimated Creatinine Clearance: 61.9 mL/min (by C-G formula based on SCr of 1.1 mg/dL). Liver Function Tests: Recent Labs  Lab 06/14/18 1648  AST 15  ALT 13  ALKPHOS 59  BILITOT 0.5  PROT 7.1  ALBUMIN 3.4*   No results for input(s): LIPASE, AMYLASE in the last 168 hours. No results for input(s): AMMONIA in the last 168 hours. Coagulation Profile: Recent Labs  Lab 06/14/18 1648  INR 1.00   Cardiac Enzymes: No results for input(s): CKTOTAL, CKMB, CKMBINDEX, TROPONINI in the last 168 hours. BNP (last 3 results) No results for input(s): PROBNP in the last 8760 hours. HbA1C: Recent Labs    06/15/18 0552  HGBA1C 9.8*   CBG: Recent Labs  Lab 06/15/18 1608 06/15/18 2143 06/16/18 0608 06/16/18 1153 06/16/18 1602  GLUCAP 217* 275* 253* 216* 189*   Lipid Profile: Recent Labs    06/15/18 0552  CHOL 224*  HDL 55  LDLCALC 146*  TRIG 115  CHOLHDL 4.1   Thyroid Function Tests: No results for input(s): TSH, T4TOTAL, FREET4, T3FREE, THYROIDAB in the last 72 hours. Anemia Panel: No results for input(s): VITAMINB12, FOLATE, FERRITIN, TIBC, IRON, RETICCTPCT in the last 72 hours. Urine analysis:    Component Value Date/Time   COLORURINE YELLOW 06/14/2018 1648   APPEARANCEUR CLEAR 06/14/2018 1648   LABSPEC 1.010 06/14/2018 1648   PHURINE 5.0 06/14/2018 1648   GLUCOSEU 150 (A) 06/14/2018 1648   HGBUR NEGATIVE 06/14/2018 1648   BILIRUBINUR NEGATIVE 06/14/2018 1648   KETONESUR  NEGATIVE 06/14/2018 1648   PROTEINUR 100 (A) 06/14/2018 1648   UROBILINOGEN 0.2 12/13/2014 2011   NITRITE NEGATIVE 06/14/2018 1648   LEUKOCYTESUR NEGATIVE 06/14/2018 1648   Sepsis Labs: @LABRCNTIP (procalcitonin:4,lacticidven:4)  )No results found for this or any previous visit (from the past 240 hour(s)).    Radiology Studies: Dg Chest 2 View  Result Date: 06/14/2018 CLINICAL DATA:  Weakness, difficulty using left hand EXAM: CHEST - 2 VIEW COMPARISON:  03/23/2018 FINDINGS: There is bilateral chronic interstitial thickening. There is no focal parenchymal opacity. There is no pleural effusion or pneumothorax. The heart and mediastinal contours are unremarkable. The osseous structures are unremarkable. IMPRESSION: No active cardiopulmonary disease. Electronically Signed   By: Elige KoHetal  Patel   On: 06/14/2018 19:32   Mr Brain Wo Contrast  Result Date: 06/15/2018 CLINICAL DATA:  Acute onset confusion and aphasia. Assess for stroke. History of recent Bell's palsy, hypertension, diabetes and stroke. EXAM: MRI HEAD WITHOUT CONTRAST TECHNIQUE: Multiplanar, multiecho pulse sequences of the brain  and surrounding structures were obtained without intravenous contrast. COMPARISON:  CT HEAD June 14, 2018 and MRI head March 23, 2018 FINDINGS: INTRACRANIAL CONTENTS: 11 x 13 x 14 mm LEFT posterior thalamus reduced diffusion with low ADC values. 4 mm reduced diffusion LEFT pons with low ADC values. No susceptibility artifact to suggest hemorrhage. No advanced parenchymal brain volume loss for age. No hydrocephalus. Old small RIGHT basal ganglia infarcts. Patchy to confluent supratentorial and pontine white matter FLAIR T2 hyperintensities. No midline shift, mass effect or masses. No abnormal extra-axial fluid collections. VASCULAR: Similar poor flow related enhancement basilar artery. SKULL AND UPPER CERVICAL SPINE: No abnormal sellar expansion. No suspicious calvarial bone marrow signal. Craniocervical junction  maintained. Partially imaged small retroauricular lipoma. SINUSES/ORBITS: Small LEFT maxillary mucosal retention cyst and trace paranasal sinus mucosal thickening. Minimal LEFT mastoid effusion.The included ocular globes and orbital contents are non-suspicious. Status post RIGHT ocular lens implant. OTHER: None. IMPRESSION: 1. Acute 14 mm LEFT thalamus and 4 mm LEFT pons nonhemorrhagic infarcts. 2. Similar moderate to severe chronic small vessel ischemic changes and old RIGHT basal ganglia infarcts. 3. Chronically attenuated basilar artery. 4. These results will be called to the ordering clinician or representative by the Radiologist Assistant, and communication documented in the PACS or zVision Dashboard. Electronically Signed   By: Awilda Metro M.D.   On: 06/15/2018 05:29   Ct Head Code Stroke Wo Contrast  Result Date: 06/14/2018 CLINICAL DATA:  Code stroke. Slurred speech, aphasia and confusion. Left facial droop. EXAM: CT HEAD WITHOUT CONTRAST TECHNIQUE: Contiguous axial images were obtained from the base of the skull through the vertex without intravenous contrast. COMPARISON:  Head CT 03/24/2018 FINDINGS: Brain: There is no mass, hemorrhage or extra-axial collection. There is generalized atrophy without lobar predilection. There is hypoattenuation of the periventricular white matter, most commonly indicating chronic ischemic microangiopathy. Vascular: No abnormal hyperdensity of the major intracranial arteries or dural venous sinuses. No intracranial atherosclerosis. Skull: The visualized skull base, calvarium and extracranial soft tissues are normal. Sinuses/Orbits: No fluid levels or advanced mucosal thickening of the visualized paranasal sinuses. No mastoid or middle ear effusion. The orbits are normal. ASPECTS [facility] Stroke Program Early CT Score) - Ganglionic level infarction (caudate, lentiform nuclei, internal capsule, insula, M1-M3 cortex): 7 - Supraganglionic infarction (M4-M6 cortex): 3  Total score (0-10 with 10 being normal): 10 IMPRESSION: 1. No acute hemorrhage. 2. ASPECTS is 10. These results were communicated to Dr. Georgiana Spinner Aroor at 5:13 pm on 06/14/2018 by text page via the [facility] messaging system. Electronically Signed   By: Deatra Robinson M.D.   On: 06/14/2018 17:14     Scheduled Meds: . acetaminophen  500 mg Oral Daily  . aspirin  325 mg Oral Daily  . atorvastatin  40 mg Oral q1800  . brimonidine  1 drop Both Eyes BID  . cholecalciferol  500 Units Oral QHS  . glipiZIDE  10 mg Oral BID AC  . pantoprazole  40 mg Oral Daily  . senna-docusate  2 tablet Oral Daily   Continuous Infusions: . sodium chloride 100 mL/hr at 06/16/18 1259     LOS: 2 days   Time Spent in minutes   45 minutes  Nakima Fluegge D.O. on 06/16/2018 at 4:42 PM  Between 7am to 7pm - Please see pager noted on [email or web address]  After 7pm go to [email or web address]  And look for the night coverage person covering for me after hours  [facility] Office  [phone]

## 2018-06-16 NOTE — Progress Notes (Signed)
STROKE TEAM PROGRESS NOTE   INTERVAL HISTORY His daughter is at the bedside.  He remains neurologically stable. Family does not want him to go to rehabilitation and would prefer that he had therapy at home.I spoke to the patient's granddaughter over the phone to inform me that he had history of DVT in his leg several years ago at the [facility] and was started on eliquis. He has not had a history of A. Fib. Lower extremity venous Dopplers have been ordered but not done yet during present admission.  Vitals:   06/16/18 0031 06/16/18 0418 06/16/18 0745 06/16/18 1155  BP: (!) 182/65 (!) 190/69 (!) 194/86 (!) 190/81  Pulse: (!) 54 66 (!) 59 (!) 52  Resp: 18 18 20 16   Temp: 99.1 F (37.3 C) 99.1 F (37.3 C) 99.3 F (37.4 C) 98.4 F (36.9 C)  TempSrc: Oral Axillary Axillary Oral  SpO2: 95% 97% 97% 95%  Weight:      Height:        CBC:  Recent Labs  Lab 06/14/18 1648 06/14/18 1717  WBC 9.3  --   NEUTROABS 6.3  --   HGB 12.4* 13.6  HCT 39.7 40.0  MCV 81.9  --   PLT 200  --     Basic Metabolic Panel:  Recent Labs  Lab 06/14/18 1648 06/14/18 1717  NA 136 138  K 4.0 4.0  CL 103 102  CO2 24  --   GLUCOSE 291* 286*  BUN 14 15  CREATININE 1.05 1.10  CALCIUM 9.6  --    Lipid Panel:     Component Value Date/Time   CHOL 224 (H) 06/15/2018 0552   TRIG 115 06/15/2018 0552   HDL 55 06/15/2018 0552   CHOLHDL 4.1 06/15/2018 0552   VLDL 23 06/15/2018 0552   LDLCALC 146 (H) 06/15/2018 0552   HgbA1c:  Lab Results  Component Value Date   HGBA1C 9.8 (H) 06/15/2018   Urine Drug Screen:     Component Value Date/Time   LABOPIA NONE DETECTED 06/14/2018 1648   COCAINSCRNUR NONE DETECTED 06/14/2018 1648   LABBENZ NONE DETECTED 06/14/2018 1648   AMPHETMU NONE DETECTED 06/14/2018 1648   THCU NONE DETECTED 06/14/2018 1648   LABBARB NONE DETECTED 06/14/2018 1648    Alcohol Level     Component Value Date/Time   ETH <10 06/14/2018 1648    IMAGING Dg Chest 2 View  Result Date:  06/14/2018 CLINICAL DATA:  Weakness, difficulty using left hand EXAM: CHEST - 2 VIEW COMPARISON:  03/23/2018 FINDINGS: There is bilateral chronic interstitial thickening. There is no focal parenchymal opacity. There is no pleural effusion or pneumothorax. The heart and mediastinal contours are unremarkable. The osseous structures are unremarkable. IMPRESSION: No active cardiopulmonary disease. Electronically Signed   By: Elige KoHetal  Patel   On: 06/14/2018 19:32   Mr Brain Wo Contrast  Result Date: 06/15/2018 CLINICAL DATA:  Acute onset confusion and aphasia. Assess for stroke. History of recent Bell's palsy, hypertension, diabetes and stroke. EXAM: MRI HEAD WITHOUT CONTRAST TECHNIQUE: Multiplanar, multiecho pulse sequences of the brain and surrounding structures were obtained without intravenous contrast. COMPARISON:  CT HEAD June 14, 2018 and MRI head March 23, 2018 FINDINGS: INTRACRANIAL CONTENTS: 11 x 13 x 14 mm LEFT posterior thalamus reduced diffusion with low ADC values. 4 mm reduced diffusion LEFT pons with low ADC values. No susceptibility artifact to suggest hemorrhage. No advanced parenchymal brain volume loss for age. No hydrocephalus. Old small RIGHT basal ganglia infarcts. Patchy to confluent supratentorial  and pontine white matter FLAIR T2 hyperintensities. No midline shift, mass effect or masses. No abnormal extra-axial fluid collections. VASCULAR: Similar poor flow related enhancement basilar artery. SKULL AND UPPER CERVICAL SPINE: No abnormal sellar expansion. No suspicious calvarial bone marrow signal. Craniocervical junction maintained. Partially imaged small retroauricular lipoma. SINUSES/ORBITS: Small LEFT maxillary mucosal retention cyst and trace paranasal sinus mucosal thickening. Minimal LEFT mastoid effusion.The included ocular globes and orbital contents are non-suspicious. Status post RIGHT ocular lens implant. OTHER: None. IMPRESSION: 1. Acute 14 mm LEFT thalamus and 4 mm LEFT pons  nonhemorrhagic infarcts. 2. Similar moderate to severe chronic small vessel ischemic changes and old RIGHT basal ganglia infarcts. 3. Chronically attenuated basilar artery. 4. These results will be called to the ordering clinician or representative by the Radiologist Assistant, and communication documented in the PACS or zVision Dashboard. Electronically Signed   By: Awilda Metro M.D.   On: 06/15/2018 05:29   Ct Head Code Stroke Wo Contrast  Result Date: 06/14/2018 CLINICAL DATA:  Code stroke. Slurred speech, aphasia and confusion. Left facial droop. EXAM: CT HEAD WITHOUT CONTRAST TECHNIQUE: Contiguous axial images were obtained from the base of the skull through the vertex without intravenous contrast. COMPARISON:  Head CT 03/24/2018 FINDINGS: Brain: There is no mass, hemorrhage or extra-axial collection. There is generalized atrophy without lobar predilection. There is hypoattenuation of the periventricular white matter, most commonly indicating chronic ischemic microangiopathy. Vascular: No abnormal hyperdensity of the major intracranial arteries or dural venous sinuses. No intracranial atherosclerosis. Skull: The visualized skull base, calvarium and extracranial soft tissues are normal. Sinuses/Orbits: No fluid levels or advanced mucosal thickening of the visualized paranasal sinuses. No mastoid or middle ear effusion. The orbits are normal. ASPECTS [facility] Stroke Program Early CT Score) - Ganglionic level infarction (caudate, lentiform nuclei, internal capsule, insula, M1-M3 cortex): 7 - Supraganglionic infarction (M4-M6 cortex): 3 Total score (0-10 with 10 being normal): 10 IMPRESSION: 1. No acute hemorrhage. 2. ASPECTS is 10. These results were communicated to Dr. Georgiana Spinner Aroor at 5:13 pm on 06/14/2018 by text page via the [facility] messaging system. Electronically Signed   By: Deatra Robinson M.D.   On: 06/14/2018 17:14   2D echocardiogram - Left ventricle: The cavity size was normal. Wall thickness  was   increased in a pattern of mild LVH. There was focal basal   hypertrophy. Systolic function was vigorous. The estimated   ejection fraction was in the range of 65% to 70%. Wall motion was   normal; there were no regional wall motion abnormalities. Doppler   parameters are consistent with abnormal left ventricular   relaxation (grade 1 diastolic dysfunction).   PHYSICAL EXAM Elderly African-American male was quite sleepy and not cooperative for exam. . Afebrile. Head is nontraumatic. Neck is supple without bruit.    Cardiac exam no murmur or gallop. Lungs are clear to auscultation. Distal pulses are well felt.left toes show old ulcers Neurological Exam :  Awake  Speech appears clear but his disoriented. Follows simple commands only. Is not cooperative for eye movement testing. He has left pan facial weakness likely from his old Bell's palsy. Tongue is midline. Motor system exam limited due to lack of cooperation. He has antigravity strength in the right upper extremity. His mild weakness in the left upper extremity 2-3/5. He has weakness in both lower extremities and is unable to lift them off the bed. He can recall his toes. Tone is increased on the left side compared to the right.sensation and coordination cannot be  reliably tested. Gait not tested. ASSESSMENT/PLAN Mr. Nihal Marzella is a 84 y.o. 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   ASPECTS 10.     MRI  L thalamic and L pontine infarct.  Similar moderate to severe small vessel disease.  Old R basal ganglia infarcts.  Chronically attenuated BA.  CTA head & neck (in August) severe posterior circulation disease.  Small BA. (no acute  stroke on MRI at that time)  2D Echo EF 65 to 70%, no source of embolus  LDL 146  HgbA1c 9.8  Eliquis for VTE prophylaxis  no aspirin (allergic to aspirin per dtr) prior to admission, on Eliquis 2.5 bid PTA, during August admission had discussed Eliquis dosing was not optimal.  Asked him to follow-up with his primary care physician.  On admission today remains on Eliquis 2.5 mg twice daily.  This has been continued in hospital  Therapy recommendations:  SNF vs home with max assist (uses stand up lift at home, has home health PT and OT through [facility])  Disposition:  pending   Pt followed at [facility] in [place]  Hypertension  Stable . Permissive hypertension (OK if < 220/120) but gradually normalize in 5-7 days . Long-term BP goal normotensive  Hyperlipidemia  Home meds: No statin -had been on Zocor 20 in August 2019  Had asked for PCP follow-up for increasing dosage  LDL 146, goal < 70  Now on Lipitor 40 mg daily  Continue statin at discharge  Diabetes type II  HgbA1c 9.8, goal < 7.0  Uncontrolled  Other Stroke Risk Factors  Advanced age  Obesity, Body mass index is 34.5 kg/m., recommend weight loss, diet and exercise as appropriate   Hx stroke/TIA  03/2018 recrudescence of old stroke - presented with increased slurred speech  2014 - right basal ganglia infarct with resultant left hemiparesis, left facial droop, left arm weakness, bilateral weakness requiring wheelchair bound status - due to combination of severe LBP - pt was bedbound at home   Other Active Problems  Hx Bell's Palsy  DVT left lower extremity on Eliquis 2.5 mg twice daily.  See note on Eliquis above  Hospital day # 2    I have personally examined this patient, reviewed notes, independently viewed imaging studies, participated in medical decision making and plan of care.ROS completed by me personally and pertinent positives fully documented  I have made any additions or clarifications  directly to the above note. Agree with note above. he has presented with new pontine and thalamic infarcts from small vessel disease. His poor neurological baseline with left hemiparesis from a prior stroke from 2014 and is essentially bedbound at baseline. I spoke to the patient's granddaughter over the phone and informed me that patient had history of DVT several years ago and was started on eliquis at the [facility] hospital in [place]. She is going to get those records for [place] today. If there is no clear pulmonary embolism documented and not believe he needs long-term eliquis and he can come off it can be switched to aspirin 325 mg at known. Had discussed this with his daughter who expressed understanding.Long discussion with the daughter the bedside as well as with Dr. Catha Gosselin. Greater than 50%  time during this 25 minute visit was spent on counseling and coordination of care of his lacunar infarct  In discussion about need for anticoagulation and answered questions. Delia Heady, MD Medical Director [facility] Stroke Center Pager: [phone] 06/16/2018 2:07 PM  To contact Stroke Continuity provider, please refer to [email or web address]. After hours, contact General Neurology

## 2018-06-16 NOTE — Care Management Important Message (Signed)
Important Message  Patient Details  Name: Tom Wang MRN: [id number] Date of Birth: 05-02-1925   Medicare Important Message Given:  Yes    Torrey Horseman Stefan ChurchBratton 06/16/2018, 3:32 PM

## 2018-06-16 NOTE — Discharge Summary (Signed)
Physician Discharge Summary  Tom Wang ZOX:[id number] DOB: 06-Aug-1924 DOA: 06/14/2018  PCP: [facility]  Admit date: 06/14/2018 Discharge date: 06/17/2018  Time spent: 45 minutes  Recommendations for Outpatient Follow-up:  Patient will be discharged to home with home health services.  Patient will need to follow up with primary care provider within one week of discharge,discuss blood pressure and diabetes management.  Follow up with neurology in 4-6 weeks. Patient should continue medications as prescribed.  Patient should follow a heart healthy/carb modified diet.   Discharge Diagnoses:  Acute CVA Essential hypertension Hyperlipidemia Diabetes mellitus, type II GERD  Discharge Condition: Stable  Diet recommendation: heart healthy/carb modifed  Filed Weights   06/14/18 2111  Weight: 121.9 kg    History of present illness:  on 06/14/2018 by Dr. Annamarie Wang a 84 y.o.malewith medical history significant ofdiabetes, hypertension, recent Bell's palsy, GERD, previous CVAs who was brought in by family today due to sudden onset of abnormal speech and aphasia with confusion. Symptoms occurred around 2:30 in the afternoon. Patient was apparently coming from his ophthalmologist where he had some eyedrops in his eyes. Family initially thought it was reaction to the medications. He was unable to identify wife and daughter. They brought him to the ER where his speech returned spontaneously around 4:00. He has been evaluated in the ER and by neurology. Suspected recurrent CVA however this could also relate to some infection or drug reaction. Patient is currently awake and communicative but is weak and drowsy. He is being admitted for CVA work-up.  Hospital Course:  Acute CVA -Presented with abnormal speech -CT head no acute hemorrhage -MRI brain: Acute 14mm left thalamus and left pons nonhemorrhagic infarcts -Hemoglobin A1c 9.8, LDL  146 -Echocardiogram EF 65-70%, G1DD -PT recommended [facility] -neurology consulted and appreciated- pending recommendations -started on lipitor (low dose given advanced age and possible side effects) -lower extremity Doppler negative for DVT -Discussed with neurology, Dr. [name] to review records from the [facility] hospital to see if patient ever had diagnosis of pulmonary embolism.  If no PE, would discontinue Eliquis and place patient on full dose aspirin daily. -On 06/16/2018, reviewed patient's records from the [place] along with granddaughter at bedside and discussing plan. No mention of PE in records presented to [place]. Granddaughter does not recall patient having a CT of chest or being told he had a clot in his lungs. Given this information, will place patient on full dose aspirin. Discussed signs and symptoms of PE and DVT. Granddaughter voiced understanding.  -today, discussed the same things with the patient's daughter at bedside -patient transitioned to aspirin after family discussion    Essential hypertension -allowed for permissive HTN during hospitalization -Upon review of patient's record, he was on losartan at some point, but not on his med list when admitted -patient should discuss BP control with PCP -will give script for losartan (low dose) to start in one week  Hyperlipidemia -Continue statin  Diabetes mellitus, type II -Continue glipizide, patient refuses to take insulin due to allergy -hemoglobin A1c 9.6 (will need to follow up with PCP for better CBG control) -Monitor CBGs  GERD -Continue PPI  Consultants Neurology   Procedures  Echocardiogram Lower extremity doppler  Discharge Exam: Vitals:   06/17/18 0412 06/17/18 0743  BP: (!) 204/69 (!) 175/85  Pulse: 60 66  Resp: 18 18  Temp: 98.7 F (37.1 C) 98.7 F (37.1 C)  SpO2: 95% 97%     General: Well developed, elderly, chronically ill  appearing, NAD  HEENT: NCAT, mucous membranes moist.  Neck:  Supple  Cardiovascular: S1 S2 auscultated, RRR  Respiratory: Clear to auscultation bilaterally with equal chest rise  Abdomen: Soft, nontender, nondistended, + bowel sounds  Extremities: warm dry without cyanosis clubbing or edema  Neuro: AAOx3, left sided weakness 3/5, left facial droop  Psych: Normal affect and demeanor  Discharge Instructions Discharge Instructions    Diet - low sodium heart healthy   Complete by:  As directed    Increase activity slowly   Complete by:  As directed      Allergies as of 06/17/2018      Reactions   Morphine And Related Other (See Comments)   SEVERE REACTION   Other Other (See Comments)   NO NARCOTICS PER FAMILY   Gabapentin Other (See Comments)   AGITATION, COMBATIVE   Insulins Other (See Comments)   Stroke like symptoms per family   Lantus [insulin Glargine] Other (See Comments)   unknown   Penicillins Other (See Comments)   UNKNOWN      Medication List    STOP taking these medications   apixaban 2.5 MG Tabs tablet Commonly known as:  ELIQUIS     TAKE these medications   acetaminophen 500 MG tablet Commonly known as:  TYLENOL Take 500 mg by mouth daily.   aspirin 325 MG tablet Take 1 tablet (325 mg total) by mouth daily.   atorvastatin 40 MG tablet Commonly known as:  LIPITOR Take 1 tablet (40 mg total) by mouth daily at 6 PM.   cholecalciferol 1000 units tablet Commonly known as:  VITAMIN D Take 500 Units by mouth at bedtime.   glipiZIDE 10 MG tablet Commonly known as:  GLUCOTROL Take 10 mg by mouth 2 (two) times daily before a meal.   losartan 25 MG tablet Commonly known as:  COZAAR Take 0.5 tablets (12.5 mg total) by mouth daily. Start taking on:  06/22/2018   omeprazole 20 MG capsule Commonly known as:  PRILOSEC Take 20 mg by mouth daily.   senna-docusate 8.6-50 MG tablet Commonly known as:  Senokot-S Take 2 tablets by mouth daily.   Turmeric Curcumin Caps Take 1 capsule by mouth daily.       Allergies  Allergen Reactions  . Morphine And Related Other (See Comments)    SEVERE REACTION  . Other Other (See Comments)    NO NARCOTICS PER FAMILY  . Gabapentin Other (See Comments)    AGITATION, COMBATIVE  . Insulins Other (See Comments)    Stroke like symptoms per family  . Lantus [Insulin Glargine] Other (See Comments)    unknown  . Penicillins Other (See Comments)    UNKNOWN   Follow-up Information    [facility] Schedule an appointment as soon as possible for a visit in 1 week(s).   Why:  Hospital follow up Contact information: [address] [facility] [place] [address] [phone]        Micki Riley, MD. Schedule an appointment as soon as possible for a visit in 6 week(s).   Specialties:  Neurology, Radiology Why:  Stroke clinic Contact information: [address] [place] [address] [phone]            The results of significant diagnostics from this hospitalization (including imaging, microbiology, ancillary and laboratory) are listed below for reference.    Significant Diagnostic Studies: Dg Chest 2 View  Result Date: 06/14/2018 CLINICAL DATA:  Weakness, difficulty using left hand EXAM: CHEST -  2 VIEW COMPARISON:  03/23/2018 FINDINGS: There is bilateral chronic interstitial thickening. There is no focal parenchymal opacity. There is no pleural effusion or pneumothorax. The heart and mediastinal contours are unremarkable. The osseous structures are unremarkable. IMPRESSION: No active cardiopulmonary disease. Electronically Signed   By: Elige Ko   On: 06/14/2018 19:32   Mr Brain Wo Contrast  Result Date: 06/15/2018 CLINICAL DATA:  Acute onset confusion and aphasia. Assess for stroke. History of recent Bell's palsy, hypertension, diabetes and stroke. EXAM: MRI HEAD WITHOUT CONTRAST TECHNIQUE: Multiplanar, multiecho pulse sequences of the brain and surrounding structures were obtained without  intravenous contrast. COMPARISON:  CT HEAD June 14, 2018 and MRI head March 23, 2018 FINDINGS: INTRACRANIAL CONTENTS: 11 x 13 x 14 mm LEFT posterior thalamus reduced diffusion with low ADC values. 4 mm reduced diffusion LEFT pons with low ADC values. No susceptibility artifact to suggest hemorrhage. No advanced parenchymal brain volume loss for age. No hydrocephalus. Old small RIGHT basal ganglia infarcts. Patchy to confluent supratentorial and pontine white matter FLAIR T2 hyperintensities. No midline shift, mass effect or masses. No abnormal extra-axial fluid collections. VASCULAR: Similar poor flow related enhancement basilar artery. SKULL AND UPPER CERVICAL SPINE: No abnormal sellar expansion. No suspicious calvarial bone marrow signal. Craniocervical junction maintained. Partially imaged small retroauricular lipoma. SINUSES/ORBITS: Small LEFT maxillary mucosal retention cyst and trace paranasal sinus mucosal thickening. Minimal LEFT mastoid effusion.The included ocular globes and orbital contents are non-suspicious. Status post RIGHT ocular lens implant. OTHER: None. IMPRESSION: 1. Acute 14 mm LEFT thalamus and 4 mm LEFT pons nonhemorrhagic infarcts. 2. Similar moderate to severe chronic small vessel ischemic changes and old RIGHT basal ganglia infarcts. 3. Chronically attenuated basilar artery. 4. These results will be called to the ordering clinician or representative by the Radiologist Assistant, and communication documented in the PACS or zVision Dashboard. Electronically Signed   By: Awilda Metro M.D.   On: 06/15/2018 05:29   Ct Head Code Stroke Wo Contrast  Result Date: 06/14/2018 CLINICAL DATA:  Code stroke. Slurred speech, aphasia and confusion. Left facial droop. EXAM: CT HEAD WITHOUT CONTRAST TECHNIQUE: Contiguous axial images were obtained from the base of the skull through the vertex without intravenous contrast. COMPARISON:  Head CT 03/24/2018 FINDINGS: Brain: There is no mass,  hemorrhage or extra-axial collection. There is generalized atrophy without lobar predilection. There is hypoattenuation of the periventricular white matter, most commonly indicating chronic ischemic microangiopathy. Vascular: No abnormal hyperdensity of the Wang intracranial arteries or dural venous sinuses. No intracranial atherosclerosis. Skull: The visualized skull base, calvarium and extracranial soft tissues are normal. Sinuses/Orbits: No fluid levels or advanced mucosal thickening of the visualized paranasal sinuses. No mastoid or middle ear effusion. The orbits are normal. ASPECTS [facility] Stroke Program Early CT Score) - Ganglionic level infarction (caudate, lentiform nuclei, internal capsule, insula, M1-M3 cortex): 7 - Supraganglionic infarction (M4-M6 cortex): 3 Total score (0-10 with 10 being normal): 10 IMPRESSION: 1. No acute hemorrhage. 2. ASPECTS is 10. These results were communicated to Dr. Georgiana Spinner Aroor at 5:13 pm on 06/14/2018 by text page via the [facility] messaging system. Electronically Signed   By: Deatra Robinson M.D.   On: 06/14/2018 17:14    Microbiology: No results found for this or any previous visit (from the past 240 hour(s)).   Labs: Basic Metabolic Panel: Recent Labs  Lab 06/14/18 1648 06/14/18 1717  NA 136 138  K 4.0 4.0  CL 103 102  CO2 24  --   GLUCOSE 291* 286*  BUN 14 15  CREATININE 1.05 1.10  CALCIUM 9.6  --    Liver Function Tests: Recent Labs  Lab 06/14/18 1648  AST 15  ALT 13  ALKPHOS 59  BILITOT 0.5  PROT 7.1  ALBUMIN 3.4*   No results for input(s): LIPASE, AMYLASE in the last 168 hours. No results for input(s): AMMONIA in the last 168 hours. CBC: Recent Labs  Lab 06/14/18 1648 06/14/18 1717  WBC 9.3  --   NEUTROABS 6.3  --   HGB 12.4* 13.6  HCT 39.7 40.0  MCV 81.9  --   PLT 200  --    Cardiac Enzymes: No results for input(s): CKTOTAL, CKMB, CKMBINDEX, TROPONINI in the last 168 hours. BNP: BNP (last 3 results) No results for  input(s): BNP in the last 8760 hours.  ProBNP (last 3 results) No results for input(s): PROBNP in the last 8760 hours.  CBG: Recent Labs  Lab 06/16/18 0608 06/16/18 1153 06/16/18 1602 06/16/18 2121 06/17/18 0606  GLUCAP 253* 216* 189* 219* 174*       Signed:  Macguire Holsinger  [facility] 06/17/2018, 9:49 AM

## 2018-06-17 LAB — GLUCOSE, CAPILLARY
GLUCOSE-CAPILLARY: 174 mg/dL — AB (ref 70–99)
GLUCOSE-CAPILLARY: 199 mg/dL — AB (ref 70–99)

## 2018-06-17 MED ORDER — ASPIRIN 325 MG PO TABS: 325.0000 mg | ORAL_TABLET | Freq: Every day | ORAL | 0 refills | Status: DC

## 2018-06-17 MED ORDER — ATORVASTATIN CALCIUM 40 MG PO TABS: 40.0000 mg | ORAL_TABLET | Freq: Every day | ORAL | 0 refills | Status: AC

## 2018-06-17 MED ORDER — LOSARTAN POTASSIUM 25 MG PO TABS: 12.5000 mg | ORAL_TABLET | Freq: Every day | ORAL | 0 refills | Status: DC

## 2018-06-17 NOTE — Progress Notes (Signed)
Physical Therapy Treatment Patient Details Name: Tom Wang MRN: [id number] DOB: 03-12-1925 Today's Date: 06/17/2018    History of Present Illness Pt is a 84 y.o male with a PMH consisting of DM, HTN, Bell's palsy, and CVA with residual R sided weakness presents to the ED with complaints of abnormal speech and weakness. MRI on 06/15/2018 showed acute L thalamic and pons infarct, as well as chronic R basal ganglia infarcts.     PT Comments    Pt presents supine, HOB elevated, and alert. Pt states willingness to participate in PT. Daughter is present throughout the session and provides assistance during. Pt is overall total A x2 for repositioning in bed. Pt continues to have pain in BLE during PROM in all directions. Secondary to weakness and pain, pt will continue to be limited in ADL's and will require heavy physical assistance of two people. Family remains adamant to return home with patient even with physical assistance of 2 people needed for functional mobility . Pt would benefit from continued skilled PT in order to increases strength and functional mobility, while decresaesing skin break down and contracture risk.    Follow Up Recommendations  Home health PT;Supervision/Assistance - 24 hour     Equipment Recommendations  None recommended by PT    Recommendations for Other Services       Precautions / Restrictions Precautions Precautions: Fall Restrictions Weight Bearing Restrictions: No    Mobility  Bed Mobility Overal bed mobility: Needs Assistance             General bed mobility comments: Pt required total A x2 for repositioning in bed for trunk elevation and flexion. Pt also total A x2 with trunk rotation exercises in bed.   Transfers                    Ambulation/Gait                 Stairs             Wheelchair Mobility    Modified Rankin (Stroke Patients Only) Modified Rankin (Stroke Patients Only) Pre-Morbid Rankin Score:  Moderately severe disability Modified Rankin: Severe disability     Balance Overall balance assessment: (deferred)                                          Cognition Arousal/Alertness: Awake/alert Behavior During Therapy: Flat affect Overall Cognitive Status: Impaired/Different from baseline Area of Impairment: Awareness;Problem solving;Safety/judgement;Following commands;Memory                   Current Attention Level: Sustained Memory: Decreased short-term memory Following Commands: Follows one step commands inconsistently;Follows one step commands with increased time Safety/Judgement: Decreased awareness of safety;Decreased awareness of deficits Awareness: Intellectual Problem Solving: Slow processing;Decreased initiation;Requires tactile cues;Requires verbal cues;Difficulty sequencing General Comments: Pt requires increased verbal and tactile cuing to complete PROM activities to BLE      Exercises General Exercises - Lower Extremity Ankle Circles/Pumps: AROM;Right;Left;AAROM;15 reps Heel Slides: PROM;Both;AAROM;Right;15 reps(Pt able to complete 5 reps with AAROM on RLE) Hip ABduction/ADduction: PROM;Both;15 reps Hip Flexion/Marching: PROM;Both;15 reps Other Exercises Other Exercises: While supine in bed with BLE in a flexed position, pt completes trunk/pelvic rotations bilaterally with total A x2    General Comments        Pertinent Vitals/Pain Pain Assessment: Faces Faces Pain Scale: Hurts little more Pain Location:  BLE Pain Descriptors / Indicators: Grimacing;Guarding Pain Intervention(s): Limited activity within patient's tolerance;Monitored during session    Home Living                      Prior Function            PT Goals (current goals can now be found in the care plan section) Acute Rehab PT Goals Patient Stated Goal: To return home to be taken care of by family PT Goal Formulation: With patient/family Time For Goal  Achievement: 06/29/18 Potential to Achieve Goals: Fair Progress towards PT goals: Progressing toward goals    Frequency    Min 4X/week      PT Plan Current plan remains appropriate    Co-evaluation              AM-PAC PT "6 Clicks" Daily Activity  Outcome Measure  Difficulty turning over in bed (including adjusting bedclothes, sheets and blankets)?: Unable Difficulty moving from lying on back to sitting on the side of the bed? : Unable Difficulty sitting down on and standing up from a chair with arms (e.g., wheelchair, bedside commode, etc,.)?: Unable Help needed moving to and from a bed to chair (including a wheelchair)?: Total Help needed walking in hospital room?: Total Help needed climbing 3-5 steps with a railing? : Total 6 Click Score: 6    End of Session   Activity Tolerance: Patient limited by pain Patient left: in bed;with nursing/sitter in room;with family/visitor present;with call bell/phone within reach Nurse Communication: Mobility status PT Visit Diagnosis: Muscle weakness (generalized) (M62.81);Other abnormalities of gait and mobility (R26.89)     Time: 1610-9604 PT Time Calculation (min) (ACUTE ONLY): 20 min  Charges:  $Therapeutic Exercise: 8-22 mins                     Rolm Bookbinder, SPT Acute Rehab [phone] (pager) [phone] (office)   Tom Wang 06/17/2018, 11:58 AM

## 2018-06-17 NOTE — Progress Notes (Signed)
P.t. Discharged from unit. Discharge instruction and handout given to daughter and granddaughter. P.t. Left via wheelchair.

## 2018-06-17 NOTE — Progress Notes (Signed)
Patient pulled out IV, attempt was made 3 times to obtain IV access, IV team used doppler with no success.  Patient family requested that patient not be stuck anymore for IV access as they feel that is why patients arms are swelling   Patient granddaughter question RN to as to why patient has a swollen area on his right upper chest.  RN assess site, patient did not appear in pain when RN touched area.  It appear that area was raised due to how patient was positioned in the bed.  Patient grandaughter advised that would like for provider to check out the area tomorrow morning   RN will continue to monitor patient

## 2018-06-17 NOTE — Progress Notes (Signed)
PT Progress Note for Charges    06/17/18 1143  PT General Charges  $$ ACUTE PT VISIT 1 Visit  PT Treatments  $Therapeutic Exercise 8-22 mins  Deborah ChalkJennifer Ember Henrikson, PT, DPT  Acute Rehabilitation Services Pager [phone] Office [phone]

## 2018-06-17 NOTE — Care Management Note (Signed)
Case Management Note  Patient Details  Name: Tom Wang MRN: [id number] Date of Birth: April 26, 1925  Subjective/Objective:   Patient for dc today, resumption orders in  [facility], HHPT, HHOT, HHST with [facility].  [facility].                   Action/Plan: DC when ready.   Expected Discharge Date:  06/17/18               Expected Discharge Plan:  Home w Home Health Services  In-House Referral:     Discharge planning Services  CM Consult  Post Acute Care Choice:  Home Health, Resumption of Svcs/PTA Provider Choice offered to:     DME Arranged:    DME Agency:     HH Arranged:  RN, PT, OT, Speech Therapy HH Agency:  [facility]  Status of Service:  In process, will continue to follow  If discussed at Long Length of Stay Meetings, dates discussed:    Additional Comments:  Leone Havenaylor, Aubrei Bouchie Clinton, RN 06/17/2018, 11:03 AM

## 2018-07-15 ENCOUNTER — Emergency Department (HOSPITAL_COMMUNITY): Payer: Medicare HMO

## 2018-07-15 ENCOUNTER — Other Ambulatory Visit: Payer: Self-pay

## 2018-07-15 ENCOUNTER — Inpatient Hospital Stay (HOSPITAL_COMMUNITY)
Admission: EM | Admit: 2018-07-15 | Discharge: 2018-07-27 | DRG: 871 | Disposition: A | Discharge status: Home Health | Payer: Medicare HMO | Attending: Internal Medicine | Admitting: Internal Medicine

## 2018-07-15 ENCOUNTER — Encounter (HOSPITAL_COMMUNITY): Payer: Self-pay

## 2018-07-15 DIAGNOSIS — Z6841 Body Mass Index (BMI) 40.0 and over, adult: Secondary | ICD-10-CM

## 2018-07-15 DIAGNOSIS — Z993 Dependence on wheelchair: Secondary | ICD-10-CM

## 2018-07-15 DIAGNOSIS — Z7984 Long term (current) use of oral hypoglycemic drugs: Secondary | ICD-10-CM

## 2018-07-15 DIAGNOSIS — D509 Iron deficiency anemia, unspecified: Secondary | ICD-10-CM | POA: Diagnosis present

## 2018-07-15 DIAGNOSIS — K8042 Calculus of bile duct with acute cholecystitis without obstruction: Secondary | ICD-10-CM

## 2018-07-15 DIAGNOSIS — G9340 Encephalopathy, unspecified: Secondary | ICD-10-CM | POA: Diagnosis not present

## 2018-07-15 DIAGNOSIS — K5900 Constipation, unspecified: Secondary | ICD-10-CM | POA: Diagnosis present

## 2018-07-15 DIAGNOSIS — Z7901 Long term (current) use of anticoagulants: Secondary | ICD-10-CM

## 2018-07-15 DIAGNOSIS — R509 Fever, unspecified: Secondary | ICD-10-CM | POA: Diagnosis present

## 2018-07-15 DIAGNOSIS — I10 Essential (primary) hypertension: Secondary | ICD-10-CM | POA: Diagnosis present

## 2018-07-15 DIAGNOSIS — Z7189 Other specified counseling: Secondary | ICD-10-CM

## 2018-07-15 DIAGNOSIS — R1084 Generalized abdominal pain: Secondary | ICD-10-CM | POA: Diagnosis not present

## 2018-07-15 DIAGNOSIS — Z888 Allergy status to other drugs, medicaments and biological substances status: Secondary | ICD-10-CM

## 2018-07-15 DIAGNOSIS — Z79899 Other long term (current) drug therapy: Secondary | ICD-10-CM

## 2018-07-15 DIAGNOSIS — K8043 Calculus of bile duct with acute cholecystitis with obstruction: Secondary | ICD-10-CM | POA: Diagnosis not present

## 2018-07-15 DIAGNOSIS — H409 Unspecified glaucoma: Secondary | ICD-10-CM | POA: Diagnosis present

## 2018-07-15 DIAGNOSIS — E1149 Type 2 diabetes mellitus with other diabetic neurological complication: Secondary | ICD-10-CM | POA: Diagnosis not present

## 2018-07-15 DIAGNOSIS — Z7401 Bed confinement status: Secondary | ICD-10-CM

## 2018-07-15 DIAGNOSIS — K819 Cholecystitis, unspecified: Secondary | ICD-10-CM | POA: Diagnosis not present

## 2018-07-15 DIAGNOSIS — I361 Nonrheumatic tricuspid (valve) insufficiency: Secondary | ICD-10-CM | POA: Diagnosis not present

## 2018-07-15 DIAGNOSIS — I6340 Cerebral infarction due to embolism of unspecified cerebral artery: Secondary | ICD-10-CM | POA: Diagnosis not present

## 2018-07-15 DIAGNOSIS — K8000 Calculus of gallbladder with acute cholecystitis without obstruction: Secondary | ICD-10-CM

## 2018-07-15 DIAGNOSIS — I639 Cerebral infarction, unspecified: Secondary | ICD-10-CM

## 2018-07-15 DIAGNOSIS — K219 Gastro-esophageal reflux disease without esophagitis: Secondary | ICD-10-CM | POA: Diagnosis present

## 2018-07-15 DIAGNOSIS — Z86718 Personal history of other venous thrombosis and embolism: Secondary | ICD-10-CM | POA: Diagnosis not present

## 2018-07-15 DIAGNOSIS — E785 Hyperlipidemia, unspecified: Secondary | ICD-10-CM | POA: Diagnosis present

## 2018-07-15 DIAGNOSIS — R400 Somnolence: Secondary | ICD-10-CM | POA: Diagnosis present

## 2018-07-15 DIAGNOSIS — E119 Type 2 diabetes mellitus without complications: Secondary | ICD-10-CM

## 2018-07-15 DIAGNOSIS — B9620 Unspecified Escherichia coli [E. coli] as the cause of diseases classified elsewhere: Secondary | ICD-10-CM | POA: Diagnosis present

## 2018-07-15 DIAGNOSIS — J690 Pneumonitis due to inhalation of food and vomit: Secondary | ICD-10-CM | POA: Diagnosis not present

## 2018-07-15 DIAGNOSIS — I69354 Hemiplegia and hemiparesis following cerebral infarction affecting left non-dominant side: Secondary | ICD-10-CM

## 2018-07-15 DIAGNOSIS — Z880 Allergy status to penicillin: Secondary | ICD-10-CM | POA: Diagnosis not present

## 2018-07-15 DIAGNOSIS — K8001 Calculus of gallbladder with acute cholecystitis with obstruction: Secondary | ICD-10-CM | POA: Diagnosis not present

## 2018-07-15 DIAGNOSIS — Z7982 Long term (current) use of aspirin: Secondary | ICD-10-CM

## 2018-07-15 DIAGNOSIS — B964 Proteus (mirabilis) (morganii) as the cause of diseases classified elsewhere: Secondary | ICD-10-CM | POA: Diagnosis present

## 2018-07-15 DIAGNOSIS — I370 Nonrheumatic pulmonary valve stenosis: Secondary | ICD-10-CM | POA: Diagnosis not present

## 2018-07-15 DIAGNOSIS — Z885 Allergy status to narcotic agent status: Secondary | ICD-10-CM

## 2018-07-15 DIAGNOSIS — A419 Sepsis, unspecified organism: Secondary | ICD-10-CM | POA: Diagnosis present

## 2018-07-15 DIAGNOSIS — J189 Pneumonia, unspecified organism: Secondary | ICD-10-CM

## 2018-07-15 DIAGNOSIS — E1165 Type 2 diabetes mellitus with hyperglycemia: Secondary | ICD-10-CM | POA: Diagnosis present

## 2018-07-15 DIAGNOSIS — G9341 Metabolic encephalopathy: Secondary | ICD-10-CM | POA: Diagnosis present

## 2018-07-15 DIAGNOSIS — I679 Cerebrovascular disease, unspecified: Secondary | ICD-10-CM | POA: Diagnosis not present

## 2018-07-15 DIAGNOSIS — Z515 Encounter for palliative care: Secondary | ICD-10-CM | POA: Diagnosis not present

## 2018-07-15 DIAGNOSIS — N200 Calculus of kidney: Secondary | ICD-10-CM

## 2018-07-15 DIAGNOSIS — K8066 Calculus of gallbladder and bile duct with acute and chronic cholecystitis without obstruction: Secondary | ICD-10-CM | POA: Diagnosis present

## 2018-07-15 DIAGNOSIS — Z8673 Personal history of transient ischemic attack (TIA), and cerebral infarction without residual deficits: Secondary | ICD-10-CM | POA: Diagnosis not present

## 2018-07-15 DIAGNOSIS — E876 Hypokalemia: Secondary | ICD-10-CM | POA: Diagnosis not present

## 2018-07-15 DIAGNOSIS — E1151 Type 2 diabetes mellitus with diabetic peripheral angiopathy without gangrene: Secondary | ICD-10-CM | POA: Diagnosis present

## 2018-07-15 LAB — CBC WITH DIFFERENTIAL/PLATELET
Abs Immature Granulocytes: 0.09 10*3/uL — ABNORMAL HIGH (ref 0.00–0.07)
Basophils Absolute: 0 10*3/uL (ref 0.0–0.1)
Basophils Relative: 0 %
EOS ABS: 0 10*3/uL (ref 0.0–0.5)
Eosinophils Relative: 0 %
HEMATOCRIT: 41.9 % (ref 39.0–52.0)
Hemoglobin: 12.7 g/dL — ABNORMAL LOW (ref 13.0–17.0)
IMMATURE GRANULOCYTES: 1 %
LYMPHS ABS: 1.3 10*3/uL (ref 0.7–4.0)
Lymphocytes Relative: 8 %
MCH: 24.8 pg — ABNORMAL LOW (ref 26.0–34.0)
MCHC: 30.3 g/dL (ref 30.0–36.0)
MCV: 81.7 fL (ref 80.0–100.0)
MONOS PCT: 17 %
Monocytes Absolute: 2.8 10*3/uL — ABNORMAL HIGH (ref 0.1–1.0)
Neutro Abs: 12.3 10*3/uL — ABNORMAL HIGH (ref 1.7–7.7)
Neutrophils Relative %: 74 %
Platelets: 184 10*3/uL (ref 150–400)
RBC: 5.13 MIL/uL (ref 4.22–5.81)
RDW: 15.3 % (ref 11.5–15.5)
WBC: 16.6 10*3/uL — ABNORMAL HIGH (ref 4.0–10.5)
nRBC: 0 % (ref 0.0–0.2)

## 2018-07-15 LAB — COMPREHENSIVE METABOLIC PANEL
ALK PHOS: 63 U/L (ref 38–126)
ALT: 10 U/L (ref 0–44)
AST: 15 U/L (ref 15–41)
Albumin: 3.5 g/dL (ref 3.5–5.0)
Anion gap: 15 (ref 5–15)
BUN: 10 mg/dL (ref 8–23)
CALCIUM: 9.7 mg/dL (ref 8.9–10.3)
CO2: 23 mmol/L (ref 22–32)
Chloride: 97 mmol/L — ABNORMAL LOW (ref 98–111)
Creatinine, Ser: 1.1 mg/dL (ref 0.61–1.24)
GFR calc Af Amer: >60 mL/min (ref >60)
GFR calc non Af Amer: 59 mL/min — ABNORMAL LOW (ref >60)
Glucose, Bld: 352 mg/dL — ABNORMAL HIGH (ref 70–99)
Potassium: 3.8 mmol/L (ref 3.5–5.1)
Sodium: 135 mmol/L (ref 135–145)
Total Bilirubin: 0.9 mg/dL (ref 0.3–1.2)
Total Protein: 7.5 g/dL (ref 6.5–8.1)

## 2018-07-15 LAB — URINALYSIS, MICROSCOPIC (REFLEX)
Bacteria, UA: NONE SEEN
WBC UA: NONE SEEN WBC/hpf (ref 0–5)

## 2018-07-15 LAB — I-STAT CG4 LACTIC ACID, ED
Lactic Acid, Venous: 2.43 mmol/L (ref 0.5–1.9)
Lactic Acid, Venous: 1.34 mmol/L (ref 0.5–1.9)

## 2018-07-15 LAB — URINALYSIS, ROUTINE W REFLEX MICROSCOPIC
Bilirubin Urine: NEGATIVE
Glucose, UA: 500 mg/dL — AB
Ketones, ur: NEGATIVE mg/dL
Leukocytes, UA: NEGATIVE
Nitrite: NEGATIVE
PH: 6 (ref 5.0–8.0)
Protein, ur: >300 mg/dL — AB
Specific Gravity, Urine: 1.025 (ref 1.005–1.030)

## 2018-07-15 LAB — GLUCOSE, CAPILLARY
Glucose-Capillary: 347 mg/dL — ABNORMAL HIGH (ref 70–99)
Glucose-Capillary: 310 mg/dL — ABNORMAL HIGH (ref 70–99)

## 2018-07-15 MED ORDER — VITAMIN B-12 100 MCG PO TABS
50.0000 ug | ORAL_TABLET | Freq: Every day | ORAL | Status: DC
  Administered 2018-07-18 – 2018-07-19 (×2): 50 ug via ORAL
  Filled 2018-07-15 (×6): qty 1

## 2018-07-15 MED ORDER — IOHEXOL 300 MG/ML  SOLN
100.0000 mL | Freq: Once | INTRAMUSCULAR | Status: AC | PRN
  Administered 2018-07-15: 100 mL via INTRAVENOUS

## 2018-07-15 MED ORDER — VITAMIN D 25 MCG (1000 UNIT) PO TABS
500.0000 [IU] | ORAL_TABLET | Freq: Every day | ORAL | Status: DC
  Administered 2018-07-15 – 2018-07-19 (×5): 500 [IU] via ORAL
  Filled 2018-07-15 (×6): qty 1

## 2018-07-15 MED ORDER — PANTOPRAZOLE SODIUM 40 MG PO TBEC
40.0000 mg | DELAYED_RELEASE_TABLET | Freq: Every day | ORAL | Status: DC
  Administered 2018-07-15 – 2018-07-20 (×3): 40 mg via ORAL
  Filled 2018-07-15 (×3): qty 1

## 2018-07-15 MED ORDER — SODIUM CHLORIDE 0.9 % IV SOLN
1.0000 g | Freq: Once | INTRAVENOUS | Status: AC
  Administered 2018-07-15: 1000 mg via INTRAVENOUS
  Filled 2018-07-15: qty 1

## 2018-07-15 MED ORDER — ATORVASTATIN CALCIUM 40 MG PO TABS
40.0000 mg | ORAL_TABLET | Freq: Every day | ORAL | Status: DC
  Administered 2018-07-18 – 2018-07-19 (×2): 40 mg via ORAL
  Filled 2018-07-15 (×3): qty 1

## 2018-07-15 MED ORDER — SODIUM CHLORIDE 0.9 % IV BOLUS
500.0000 mL | Freq: Once | INTRAVENOUS | Status: AC
  Administered 2018-07-15: 500 mL via INTRAVENOUS

## 2018-07-15 MED ORDER — SENNOSIDES-DOCUSATE SODIUM 8.6-50 MG PO TABS
2.0000 | ORAL_TABLET | Freq: Every day | ORAL | Status: DC
  Administered 2018-07-15 – 2018-07-20 (×3): 2 via ORAL
  Filled 2018-07-15 (×3): qty 2

## 2018-07-15 MED ORDER — SODIUM CHLORIDE 0.9 % IV SOLN
INTRAVENOUS | Status: DC
  Administered 2018-07-15 – 2018-07-18 (×7): via INTRAVENOUS

## 2018-07-15 MED ORDER — SODIUM CHLORIDE 0.9 % IV SOLN
Freq: Once | INTRAVENOUS | Status: AC
  Administered 2018-07-15: 15:00:00 via INTRAVENOUS

## 2018-07-15 MED ORDER — SODIUM CHLORIDE 0.9 % IV SOLN
Freq: Once | INTRAVENOUS | Status: AC
  Administered 2018-07-16: 11:00:00 via INTRAVENOUS

## 2018-07-15 MED ORDER — ENOXAPARIN SODIUM 40 MG/0.4ML ~~LOC~~ SOLN
40.0000 mg | SUBCUTANEOUS | Status: DC
  Filled 2018-07-15 (×2): qty 0.4

## 2018-07-15 MED ORDER — GLIPIZIDE 5 MG PO TABS
10.0000 mg | ORAL_TABLET | Freq: Two times a day (BID) | ORAL | Status: DC
  Administered 2018-07-15 – 2018-07-20 (×7): 10 mg via ORAL
  Filled 2018-07-15 (×11): qty 2

## 2018-07-15 MED ORDER — SODIUM CHLORIDE 0.9 % IV SOLN
1.0000 g | INTRAVENOUS | Status: DC
  Administered 2018-07-15 – 2018-07-19 (×5): 1000 mg via INTRAVENOUS
  Filled 2018-07-15 (×6): qty 1

## 2018-07-15 MED ORDER — ACETAMINOPHEN 500 MG PO TABS
500.0000 mg | ORAL_TABLET | Freq: Every day | ORAL | Status: DC
  Administered 2018-07-19 – 2018-07-20 (×2): 500 mg via ORAL
  Filled 2018-07-15 (×3): qty 1

## 2018-07-15 MED ORDER — LOSARTAN POTASSIUM 25 MG PO TABS
12.5000 mg | ORAL_TABLET | Freq: Every day | ORAL | Status: DC
  Filled 2018-07-15 (×3): qty 0.5

## 2018-07-15 NOTE — Consult Note (Signed)
Reason for Consult:gallstones Referring Physician: Everrett CoombeMessick, Peter  Edd Rasmussen is an 84 y.o. male.  HPI: 84 yo male who has been less aware in the last week. Since his stroke last month he has had less activity but still is usually sharp. In the last week he has been less conversive and sleeping more. He has had pain and the family is concerned about constipation. He has not vomited.  Past Medical History:  Diagnosis Date  . Arthritis    ankles  . Diabetes mellitus    oral meds only  . GERD (gastroesophageal reflux disease)   . Hypertension   . Pneumonia    20 years ago .  Marland Kitchen. Stroke [facility](HCC)     Past Surgical History:  Procedure Laterality Date  . CATARACT EXTRACTION W/PHACO  04/26/2012   Procedure: CATARACT EXTRACTION PHACO AND INTRAOCULAR LENS PLACEMENT (IOC);  Surgeon: Chalmers Guestoy Whitaker, MD;  [facility];  Service: Ophthalmology;  Laterality: Right;  . TONSILLECTOMY    . TUMOR EXCISION     60 years ago .. right forearm .     Family History  Problem Relation Age of Onset  . Other Grandchild     Social History:  reports that he has never smoked. He has never used smokeless tobacco. He reports that he does not drink alcohol or use drugs.  Allergies:  Allergies  Allergen Reactions  . Morphine And Related Other (See Comments)    SEVERE REACTION  . Other Other (See Comments)    NO NARCOTICS PER FAMILY  . Gabapentin Other (See Comments)    AGITATION, COMBATIVE  . Insulins Other (See Comments)    Stroke like symptoms per family  . Lantus [Insulin Glargine] Other (See Comments)    unknown  . Penicillins Other (See Comments)    UNKNOWN    Medications: I have reviewed the patient's current medications.  Results for orders placed or performed during the hospital encounter of 07/15/18 (from the past 48 hour(s))  Comprehensive metabolic panel     Status: Abnormal   Collection Time: 07/15/18  5:28 AM  Result Value Ref Range   Sodium 135 135 - 145 mmol/L   Potassium 3.8  3.5 - 5.1 mmol/L   Chloride 97 (L) 98 - 111 mmol/L   CO2 23 22 - 32 mmol/L   Glucose, Bld 352 (H) 70 - 99 mg/dL   BUN 10 8 - 23 mg/dL   Creatinine, Ser 1.611.10 0.61 - 1.24 mg/dL   Calcium 9.7 8.9 - 09.610.3 mg/dL   Total Protein 7.5 6.5 - 8.1 g/dL   Albumin 3.5 3.5 - 5.0 g/dL   AST 15 15 - 41 U/L   ALT 10 0 - 44 U/L   Alkaline Phosphatase 63 38 - 126 U/L   Total Bilirubin 0.9 0.3 - 1.2 mg/dL   GFR calc non Af Amer 59 (L) >60 mL/min   GFR calc Af Amer >60 >60 mL/min   Anion gap 15 5 - 15    Comment: Performed at [facility], [address] [place], [place] [address]  CBC with Differential     Status: Abnormal   Collection Time: 07/15/18  5:28 AM  Result Value Ref Range   WBC 16.6 (H) 4.0 - 10.5 K/uL   RBC 5.13 4.22 - 5.81 MIL/uL   Hemoglobin 12.7 (L) 13.0 - 17.0 g/dL   HCT 98.141.9 19.139.0 - 47.852.0 %   MCV 81.7 80.0 - 100.0 fL   MCH 24.8 (L) 26.0 - 34.0  pg   MCHC 30.3 30.0 - 36.0 g/dL   RDW 82.9 56.2 - 13.0 %   Platelets 184 150 - 400 K/uL   nRBC 0.0 0.0 - 0.2 %   Neutrophils Relative % 74 %   Neutro Abs 12.3 (H) 1.7 - 7.7 K/uL   Lymphocytes Relative 8 %   Lymphs Abs 1.3 0.7 - 4.0 K/uL   Monocytes Relative 17 %   Monocytes Absolute 2.8 (H) 0.1 - 1.0 K/uL   Eosinophils Relative 0 %   Eosinophils Absolute 0.0 0.0 - 0.5 K/uL   Basophils Relative 0 %   Basophils Absolute 0.0 0.0 - 0.1 K/uL   Immature Granulocytes 1 %   Abs Immature Granulocytes 0.09 (H) 0.00 - 0.07 K/uL    Comment: Performed at [facility], [address] [place], [place] [address]  I-Stat CG4 Lactic Acid, ED     Status: Abnormal   Collection Time: 07/15/18  5:54 AM  Result Value Ref Range   Lactic Acid, Venous 2.43 (HH) 0.5 - 1.9 mmol/L   Comment NOTIFIED PHYSICIAN   I-Stat CG4 Lactic Acid, ED     Status: None   Collection Time: 07/15/18 10:03 AM  Result Value Ref Range   Lactic Acid, Venous 1.34 0.5 - 1.9 mmol/L  Urinalysis, Routine w reflex microscopic     Status: Abnormal   Collection Time:  07/15/18 10:33 AM  Result Value Ref Range   Color, Urine YELLOW (A) YELLOW    Comment: BIOCHEMICALS MAY BE AFFECTED BY COLOR CORRECTED ON 12/14 AT 1148: PREVIOUSLY REPORTED AS YELLOW    APPearance CLOUDY (A) CLEAR   Specific Gravity, Urine 1.025 1.005 - 1.030   pH 6.0 5.0 - 8.0   Glucose, UA 500 (A) NEGATIVE mg/dL   Hgb urine dipstick SMALL (A) NEGATIVE   Bilirubin Urine NEGATIVE NEGATIVE   Ketones, ur NEGATIVE NEGATIVE mg/dL   Protein, ur >469 (A) NEGATIVE mg/dL   Nitrite NEGATIVE NEGATIVE   Leukocytes, UA NEGATIVE NEGATIVE    Comment: Performed at [facility], [address] [place], [place] [address]  Urinalysis, Microscopic (reflex)     Status: None   Collection Time: 07/15/18 10:33 AM  Result Value Ref Range   RBC / HPF 0-5 0 - 5 RBC/hpf   WBC, UA NONE SEEN 0 - 5 WBC/hpf   Bacteria, UA NONE SEEN NONE SEEN   Squamous Epithelial / LPF 0-5 0 - 5    Comment: Performed at [facility], [address] [place], [place] [address]    Dg Chest 2 View  Result Date: 111-Feb-202019 CLINICAL DATA:  Fever and vomiting. Possible infection. EXAM: CHEST - 2 VIEW COMPARISON:  06/14/2018. FINDINGS: Cardiomegaly. Low lung volumes. No consolidation or edema. No effusion or pneumothorax. Calcified tortuous aorta. IMPRESSION: Cardiomegaly. No active disease. Electronically Signed   By: Elsie Stain M.D.   On: 111-Feb-202019 07:04   Ct Head Wo Contrast  Result Date: 111-Feb-202019 CLINICAL DATA:  Fever and vomiting. EXAM: CT HEAD WITHOUT CONTRAST TECHNIQUE: Contiguous axial images were obtained from the base of the skull through the vertex without intravenous contrast. COMPARISON:  June 14, 2018 FINDINGS: Brain: No subdural, epidural, or subarachnoid hemorrhage. Cerebellum, brainstem, and basal cisterns are normal. Ventricles and sulci are unchanged. White matter changes are stable. No acute cortical ischemia or infarct. No mass effect or midline shift. Vascular: No hyperdense vessel or  unexpected calcification. Skull: Normal. Negative for fracture or focal lesion. Sinuses/Orbits: No acute finding.  Other: None. IMPRESSION: 1. No acute intracranial abnormalities. No cause for fever or vomiting noted. Electronically Signed   By: Gerome Sam III M.D   On: 07/15/2018 13:36   Ct Abdomen Pelvis W Contrast  Result Date: 07/15/2018 CLINICAL DATA:  Acute generalized abdominal pain.  Vomiting. EXAM: CT ABDOMEN AND PELVIS WITH CONTRAST TECHNIQUE: Multidetector CT imaging of the abdomen and pelvis was performed using the standard protocol following bolus administration of intravenous contrast. CONTRAST:  OMNIPAQUE IOHEXOL 300 MG/ML  SOLN COMPARISON:  None. FINDINGS: Lower chest: Minimal bibasilar posterior subsegmental atelectasis is noted. Hepatobiliary: No biliary dilatation is noted. The liver is unremarkable. Multiple gallstones are noted with the largest measuring 4 cm in the neck of the gallbladder. Mild gallbladder dilatation is noted with mild surrounding fluid and inflammatory changes concerning for acute cholecystitis. Pancreas: Unremarkable. No pancreatic ductal dilatation or surrounding inflammatory changes. Spleen: Normal in size without focal abnormality. Adrenals/Urinary Tract: Adrenal glands are unremarkable. Kidneys are normal, without renal calculi, focal lesion, or hydronephrosis. Bladder is unremarkable. Stomach/Bowel: Stomach is within normal limits. Appendix appears normal. No evidence of bowel wall thickening, distention, or inflammatory changes. Vascular/Lymphatic: Aortic atherosclerosis. No enlarged abdominal or pelvic lymph nodes. Reproductive: Prostate is unremarkable. Other: Moderate fat containing right inguinal hernia is noted. No ascites is noted. Musculoskeletal: No acute or significant osseous findings. IMPRESSION: Cholelithiasis is noted with 4 cm gallstone noted in neck of gallbladder. Mild gallbladder dilatation is noted with mild surrounding fluid and  inflammatory changes concerning for acute cholecystitis. Moderate size fat containing right inguinal hernia. Aortic Atherosclerosis (ICD10-I70.0). Electronically Signed   By: Lupita Raider, M.D.   On: 07/15/2018 13:39    Review of Systems  Constitutional: Positive for fever and malaise/fatigue. Negative for chills.  HENT: Negative for hearing loss.   Eyes: Negative for blurred vision and double vision.  Respiratory: Negative for cough and hemoptysis.   Cardiovascular: Negative for chest pain and palpitations.  Gastrointestinal: Positive for abdominal pain. Negative for nausea and vomiting.  Genitourinary: Negative for dysuria and urgency.  Musculoskeletal: Negative for myalgias and neck pain.  Skin: Negative for itching and rash.  Neurological: Positive for weakness. Negative for dizziness, tingling and headaches.  Endo/Heme/Allergies: Does not bruise/bleed easily.  Psychiatric/Behavioral: Positive for memory loss. Negative for depression and suicidal ideas.   Blood pressure (!) 161/82, pulse 82, temperature 99.6 F (37.6 C), temperature source Rectal, resp. rate (!) 0, height 6\' 2"  (1.88 m), weight (!) 147.4 kg, SpO2 98 %. Physical Exam  Constitutional: He appears well-developed and well-nourished.  HENT:  Head: Normocephalic and atraumatic.  Eyes: Left eye exhibits no discharge. Scleral icterus is present.  Cardiovascular: Normal rate and regular rhythm.  Respiratory: Effort normal and breath sounds normal. No respiratory distress. He has no wheezes.  GI: Soft. He exhibits no distension.  Grimace to some palpation but not consistent  Neurological:  Somnolent, difficult to arouse  Skin: Skin is warm and dry.    Assessment/Plan: 84 yo male with decreased activity/energy and nausea. Work up concerning for gallbladder disease with large stones and leukocytosis. It sounds like he has been less alert in the last week and also waxed and waning in last 2 months since stroke. Will get  HIDA tonight to further evaluate GB function. Recommend broad spectrum ABX now. If HIDA positive will discuss drain vs surgery further with family.  De Blanch Jarry Manon 07/15/2018, 4:23 PM

## 2018-07-15 NOTE — Progress Notes (Signed)
Patient arrived to 6n19, Oriented to self only.  Family at bedside.  Will continue to monitor.

## 2018-07-15 NOTE — ED Provider Notes (Signed)
Patient seen after signout. He appears comfortable.  Patient's CT imaging suggest acute cholecystitis with a gallstone in the gallbladder neck.  Surgery is aware of case.  They will evaluate as consult.  Hospitalist service is aware of case and will evaluate for admission.   Wynetta FinesMessick, Samul Mcinroy C, MD 07/15/18 1534

## 2018-07-15 NOTE — ED Provider Notes (Signed)
[facility] EMERGENCY DEPARTMENT Provider Note   CSN: [id number] Arrival date & time: 07/15/18  0448     History   Chief Complaint Chief Complaint  Patient presents with  . Fever    HPI Tom Wang is a 84 y.o. male.  Patient brought to the ER by ambulance from home.  Patient had onset of nausea and vomited one time around lunchtime yesterday.  Family reports that he has been less active than usual, seems more confused and tired, sleepy.  He did complain of some abdominal pain earlier.  Family was concerned he might have a fever or an infection.  He has not had any cough or chest congestion.  There has not been any diarrhea.     Past Medical History:  Diagnosis Date  . Arthritis    ankles  . Diabetes mellitus    oral meds only  . GERD (gastroesophageal reflux disease)   . Hypertension   . Pneumonia    20 years ago .  Marland Kitchen Stroke [facility])     Patient Active Problem List   Diagnosis Date Noted  . Acute CVA (cerebrovascular accident) (HCC) 06/14/2018  . Somnolence   . TIA (transient ischemic attack) 03/23/2018  . DM type 2 (diabetes mellitus, type 2) (HCC) 03/23/2018  . H/O: CVA (cerebrovascular accident) 03/23/2018  . Left hemiparesis (HCC) 03/23/2018  . Chronic anticoagulation- for h/o LLE DVT, on Eliquis. 03/23/2018  . Gastroesophageal reflux disease without esophagitis 08/29/2016  . Essential hypertension 08/29/2016  . Hyperlipidemia 08/29/2016    Past Surgical History:  Procedure Laterality Date  . CATARACT EXTRACTION W/PHACO  04/26/2012   Procedure: CATARACT EXTRACTION PHACO AND INTRAOCULAR LENS PLACEMENT (IOC);  Surgeon: Chalmers Guest, MD;  Location: [facility] OR;  Service: Ophthalmology;  Laterality: Right;  . TONSILLECTOMY    . TUMOR EXCISION     60 years ago .. right forearm .         Home Medications    Prior to Admission medications   Medication Sig Start Date End Date Taking? Authorizing Provider  acetaminophen (TYLENOL) 500 MG  tablet Take 500 mg by mouth daily.    Yes [provider]  Ascorbic Acid (VITAMIN C PO) Take 1 tablet by mouth daily.   Yes [provider]  aspirin 325 MG tablet Take 1 tablet (325 mg total) by mouth daily. 06/17/18  Yes Mikhail, Nita Sells, DO  atorvastatin (LIPITOR) 40 MG tablet Take 1 tablet (40 mg total) by mouth daily at 6 PM. 06/17/18  Yes Mikhail, [place], DO  cholecalciferol (VITAMIN D) 1000 UNITS tablet Take 500 Units by mouth at bedtime.    Yes [provider]  Cyanocobalamin (VITAMIN B-12 PO) Take 1 tablet by mouth daily.   Yes [provider]  glipiZIDE (GLUCOTROL) 10 MG tablet Take 10 mg by mouth 2 (two) times daily before a meal.   Yes [provider]  losartan (COZAAR) 25 MG tablet Take 0.5 tablets (12.5 mg total) by mouth daily. 06/22/18 07/22/18 Yes Mikhail, [place], DO  Misc Natural Products (TURMERIC CURCUMIN) CAPS Take 1 capsule by mouth daily.    Yes [provider]  omeprazole (PRILOSEC) 20 MG capsule Take 20 mg by mouth daily.   Yes [provider]  senna-docusate (SENOKOT-S) 8.6-50 MG tablet Take 2 tablets by mouth daily.   Yes [provider]    Family History Family History  Problem Relation Age of Onset  . Other Grandchild     Social History Social History  Tobacco Use  . Smoking status: Never Smoker  . Smokeless tobacco: Never Used  Substance Use Topics  . Alcohol use: No  . Drug use: No     Allergies   Morphine and related; Other; Gabapentin; Insulins; Lantus [insulin glargine]; and Penicillins   Review of Systems Review of Systems  Constitutional: Positive for fatigue.  Gastrointestinal: Positive for abdominal pain, nausea and vomiting.  Psychiatric/Behavioral: Positive for confusion.  All other systems reviewed and are negative.    Physical Exam Updated Vital Signs BP (!) 188/91   Pulse 95   Temp 99.6 F (37.6 C) (Rectal)   Resp (!) 22   Ht 6\' 2"  (1.88 m)   Wt (!)  147.4 kg   SpO2 97%   BMI 41.73 kg/m   Physical Exam Vitals signs and nursing note reviewed.  Constitutional:      General: He is not in acute distress.    Appearance: Normal appearance. He is well-developed.  HENT:     Head: Normocephalic and atraumatic.     Right Ear: Hearing normal.     Left Ear: Hearing normal.     Nose: Nose normal.  Eyes:     Conjunctiva/sclera: Conjunctivae normal.     Pupils: Pupils are equal, round, and reactive to light.  Neck:     Musculoskeletal: Normal range of motion and neck supple.  Cardiovascular:     Rate and Rhythm: Regular rhythm.     Heart sounds: S1 normal and S2 normal. No murmur. No friction rub. No gallop.   Pulmonary:     Effort: Pulmonary effort is normal. No respiratory distress.     Breath sounds: Normal breath sounds.  Chest:     Chest wall: No tenderness.  Abdominal:     General: Bowel sounds are normal.     Tenderness: There is no abdominal tenderness. There is no guarding or rebound. Negative signs include Murphy's sign and McBurney's sign.     Hernia: No hernia is present.     Comments: Slight distention with diffuse tenderness, no guarding or rebound  Musculoskeletal: Normal range of motion.  Skin:    General: Skin is warm and dry.     Findings: No rash.  Neurological:     Mental Status: He is alert.     GCS: GCS eye subscore is 4. GCS verbal subscore is 5. GCS motor subscore is 6.     Cranial Nerves: No cranial nerve deficit.     Sensory: No sensory deficit.     Coordination: Coordination normal.     Comments: Somnolent, awakens to voice and answers questions  Psychiatric:        Speech: Speech normal.        Behavior: Behavior normal.        Thought Content: Thought content normal.      ED Treatments / Results  Labs (all labs ordered are listed, but only abnormal results are displayed) Labs Reviewed  COMPREHENSIVE METABOLIC PANEL - Abnormal; Notable for the following components:      Result Value    Chloride 97 (*)    Glucose, Bld 352 (*)    GFR calc non Af Amer 59 (*)    All other components within normal limits  CBC WITH DIFFERENTIAL/PLATELET - Abnormal; Notable for the following components:   WBC 16.6 (*)    Hemoglobin 12.7 (*)    MCH 24.8 (*)    Neutro Abs 12.3 (*)    Monocytes Absolute 2.8 (*)  Abs Immature Granulocytes 0.09 (*)    All other components within normal limits  I-STAT CG4 LACTIC ACID, ED - Abnormal; Notable for the following components:   Lactic Acid, Venous 2.43 (*)    All other components within normal limits  URINE CULTURE  CULTURE, BLOOD (ROUTINE X 2)  CULTURE, BLOOD (ROUTINE X 2)  URINALYSIS, ROUTINE W REFLEX MICROSCOPIC  I-STAT CG4 LACTIC ACID, ED    EKG None  Radiology Dg Chest 2 View  Result Date: 07/15/2018 CLINICAL DATA:  Fever and vomiting. Possible infection. EXAM: CHEST - 2 VIEW COMPARISON:  06/14/2018. FINDINGS: Cardiomegaly. Low lung volumes. No consolidation or edema. No effusion or pneumothorax. Calcified tortuous aorta. IMPRESSION: Cardiomegaly. No active disease. Electronically Signed   By: Elsie Stain M.D.   On: 07/15/2018 07:04    Procedures Procedures (including critical care time)  Medications Ordered in ED Medications  iohexol (OMNIPAQUE) 300 MG/ML solution 100 mL (has no administration in time range)     Initial Impression / Assessment and Plan / ED Course  I have reviewed the triage vital signs and the nursing notes.  Pertinent labs & imaging results that were available during my care of the patient were reviewed by me and considered in my medical decision making (see chart for details).     Patient presenting for evaluation of mental status changes.  Patient did have an episode of vomiting yesterday and has had some complaints of abdominal pain.  At arrival to the emergency department, patient is somnolent.  No focal neurologic deficit.  Neck is supple, no meningismus.  Abdominal exam reveals mild distention and  tenderness, no signs of peritonitis.  Patient does have an elevated white blood cell count and slightly elevated lactic acid.  Chest x-ray does not show evidence of pneumonia.  Urinalysis still pending.  Will perform CT head and abdominal pain for further evaluation.  Suspect delirium secondary to medical condition, possibly infection.  Does not appear septic at this time.  Will sign out to oncoming ER physician to follow-up.  Final Clinical Impressions(s) / ED Diagnoses   Final diagnoses:  Generalized abdominal pain    ED Discharge Orders    None       Blinda Leatherwood Canary Brim, MD 07/15/18 0800

## 2018-07-15 NOTE — ED Notes (Signed)
Family informed that pt needs UA. Family stated that she would try to get him to urinate in urinal. RN approved.

## 2018-07-15 NOTE — ED Notes (Addendum)
Family member requested for pt to try to urinate. RN agreeable to let pt try. Family made aware that UA was needed to continue plan of care.

## 2018-07-15 NOTE — ED Triage Notes (Signed)
Pt brought in by EMS due to having fever and vomiting. Pt had one episode of vomiting yesterday. Family is concerned that pt has an infection.

## 2018-07-15 NOTE — H&P (Signed)
History and Physical  Tom Wang WUJ:[id number] DOB: June 17, 1925 DOA: 1Feb 12, 202019  Referring physician: Wynetta Fines, MD PCP: [name]  Outpatient Specialists:  Patient coming from: Home & is able to ambulate   Chief Complaint: Nausea and vomiting x1 and abdominal pain  HPI: Tom Wang is a 84 y.o. male with medical history significant for diabetes mellitus recent stroke hypertension GERD, who was brought by ambulance from home because of sudden onset of nausea and vomiting around lunchtime yesterday family reported patient had been less active than usual seem more confused and tired and sleeping a lot.  He had complained of an abdominal pain earlier but not recently.  He stated that he does not usually usually complain a lot.  Do think he may have also have some fever and they were concerned about possible infection.  He does have a little bit of cough with a little congestion he denied any diarrhea.    ED Course: Patient was evaluated by surgery and recommend inpatient admission to medicine  Review of Systems: Fatigue confusion not as active as he used to abdominal pain nausea vomiting x1 Pt complains of nausea x1 mostly from family who at bedside because patient was very drowsy and was not very cooperative and some abdominal pain    Past Medical History:  Diagnosis Date  . Arthritis    ankles  . Diabetes mellitus    oral meds only  . GERD (gastroesophageal reflux disease)   . Hypertension   . Pneumonia    20 years ago .  Marland Kitchen Stroke [facility])    Past Surgical History:  Procedure Laterality Date  . CATARACT EXTRACTION W/PHACO  04/26/2012   Procedure: CATARACT EXTRACTION PHACO AND INTRAOCULAR LENS PLACEMENT (IOC);  Surgeon: Chalmers Guest, MD;  Location: [facility] OR;  Service: Ophthalmology;  Laterality: Right;  . TONSILLECTOMY    . TUMOR EXCISION     60 years ago .. right forearm .     Social History:  reports that he has never smoked. He has never used  smokeless tobacco. He reports that he does not drink alcohol or use drugs.   Allergies  Allergen Reactions  . Morphine And Related Other (See Comments)    SEVERE REACTION  . Other Other (See Comments)    NO NARCOTICS PER FAMILY  . Gabapentin Other (See Comments)    AGITATION, COMBATIVE  . Insulins Other (See Comments)    Stroke like symptoms per family  . Lantus [Insulin Glargine] Other (See Comments)    unknown  . Penicillins Other (See Comments)    UNKNOWN    Family History  Problem Relation Age of Onset  . Other Grandchild       Prior to Admission medications   Medication Sig Start Date End Date Taking? Authorizing Provider  acetaminophen (TYLENOL) 500 MG tablet Take 500 mg by mouth daily.    Yes [provider]  Ascorbic Acid (VITAMIN C PO) Take 1 tablet by mouth daily.   Yes [provider]  aspirin 325 MG tablet Take 1 tablet (325 mg total) by mouth daily. 06/17/18  Yes Mikhail, Nita Sells, DO  atorvastatin (LIPITOR) 40 MG tablet Take 1 tablet (40 mg total) by mouth daily at 6 PM. 06/17/18  Yes Mikhail, [place], DO  cholecalciferol (VITAMIN D) 1000 UNITS tablet Take 500 Units by mouth at bedtime.    Yes [provider]  Cyanocobalamin (VITAMIN B-12 PO) Take 1 tablet by mouth daily.   Yes [provider]  glipiZIDE (  GLUCOTROL) 10 MG tablet Take 10 mg by mouth 2 (two) times daily before a meal.   Yes [provider]  losartan (COZAAR) 25 MG tablet Take 0.5 tablets (12.5 mg total) by mouth daily. 06/22/18 07/22/18 Yes Mikhail, [place], DO  Misc Natural Products (TURMERIC CURCUMIN) CAPS Take 1 capsule by mouth daily.    Yes [provider]  omeprazole (PRILOSEC) 20 MG capsule Take 20 mg by mouth daily.   Yes [provider]  senna-docusate (SENOKOT-S) 8.6-50 MG tablet Take 2 tablets by mouth daily.   Yes [provider]    Physical Exam: BP (!) 161/82   Pulse 82   Temp 99.6 F (37.6 C) (Rectal)   Resp  (!) 0   Ht 6\' 2"  (1.88 m)   Wt (!) 147.4 kg   SpO2 98%   BMI 41.73 kg/m   Exam:  . General: 84 y.o. year-old male well developed well nourished in no acute distress.  Alert and oriented x3.  Patient is somnolent and drowsy obese . Cardiovascular: Regular rate and rhythm with no rubs or gallops.  No thyromegaly or JVD noted.   Marland Kitchen Respiratory: Clear to auscultation with no wheezes or rales. Good inspiratory effort. . Abdomen: Soft vague tenderness to palpation, no hepatosplenomegaly nondistended with normal bowel sounds x4 quadrants. . Musculoskeletal: Trace lower extremity edema. 2/4 pulses in all 4 extremities. . Skin: No ulcerative lesions noted or rashes, . Psychiatry: Mood is appropriate for condition and setting somnolent patient stated that he was tired           Labs on Admission:  Basic Metabolic Panel: Recent Labs  Lab 07/15/18 0528  NA 135  K 3.8  CL 97*  CO2 23  GLUCOSE 352*  BUN 10  CREATININE 1.10  CALCIUM 9.7   Liver Function Tests: Recent Labs  Lab 07/15/18 0528  AST 15  ALT 10  ALKPHOS 63  BILITOT 0.9  PROT 7.5  ALBUMIN 3.5   No results for input(s): LIPASE, AMYLASE in the last 168 hours. No results for input(s): AMMONIA in the last 168 hours. CBC: Recent Labs  Lab 07/15/18 0528  WBC 16.6*  NEUTROABS 12.3*  HGB 12.7*  HCT 41.9  MCV 81.7  PLT 184   Cardiac Enzymes: No results for input(s): CKTOTAL, CKMB, CKMBINDEX, TROPONINI in the last 168 hours.  BNP (last 3 results) No results for input(s): BNP in the last 8760 hours.  ProBNP (last 3 results) No results for input(s): PROBNP in the last 8760 hours.  CBG: No results for input(s): GLUCAP in the last 168 hours.  Radiological Exams on Admission: Dg Chest 2 View  Result Date: 07/15/2018 CLINICAL DATA:  Fever and vomiting. Possible infection. EXAM: CHEST - 2 VIEW COMPARISON:  06/14/2018. FINDINGS: Cardiomegaly. Low lung volumes. No consolidation or edema. No effusion or pneumothorax.  Calcified tortuous aorta. IMPRESSION: Cardiomegaly. No active disease. Electronically Signed   By: Elsie Stain M.D.   On: 07/15/2018 07:04   Ct Head Wo Contrast  Result Date: 07/15/2018 CLINICAL DATA:  Fever and vomiting. EXAM: CT HEAD WITHOUT CONTRAST TECHNIQUE: Contiguous axial images were obtained from the base of the skull through the vertex without intravenous contrast. COMPARISON:  June 14, 2018 FINDINGS: Brain: No subdural, epidural, or subarachnoid hemorrhage. Cerebellum, brainstem, and basal cisterns are normal. Ventricles and sulci are unchanged. White matter changes are stable. No acute cortical ischemia or infarct. No mass effect or midline shift. Vascular: No hyperdense vessel or unexpected calcification. Skull: Normal. Negative  for fracture or focal lesion. Sinuses/Orbits: No acute finding. Other: None. IMPRESSION: 1. No acute intracranial abnormalities. No cause for fever or vomiting noted. Electronically Signed   By: Gerome Samavid  Williams III M.D   On: 113-Apr-202019 13:36   Ct Abdomen Pelvis W Contrast  Result Date: 113-Apr-202019 CLINICAL DATA:  Acute generalized abdominal pain.  Vomiting. EXAM: CT ABDOMEN AND PELVIS WITH CONTRAST TECHNIQUE: Multidetector CT imaging of the abdomen and pelvis was performed using the standard protocol following bolus administration of intravenous contrast. CONTRAST:  100mL OMNIPAQUE IOHEXOL 300 MG/ML  SOLN COMPARISON:  None. FINDINGS: Lower chest: Minimal bibasilar posterior subsegmental atelectasis is noted. Hepatobiliary: No biliary dilatation is noted. The liver is unremarkable. Multiple gallstones are noted with the largest measuring 4 cm in the neck of the gallbladder. Mild gallbladder dilatation is noted with mild surrounding fluid and inflammatory changes concerning for acute cholecystitis. Pancreas: Unremarkable. No pancreatic ductal dilatation or surrounding inflammatory changes. Spleen: Normal in size without focal abnormality. Adrenals/Urinary Tract:  Adrenal glands are unremarkable. Kidneys are normal, without renal calculi, focal lesion, or hydronephrosis. Bladder is unremarkable. Stomach/Bowel: Stomach is within normal limits. Appendix appears normal. No evidence of bowel wall thickening, distention, or inflammatory changes. Vascular/Lymphatic: Aortic atherosclerosis. No enlarged abdominal or pelvic lymph nodes. Reproductive: Prostate is unremarkable. Other: Moderate fat containing right inguinal hernia is noted. No ascites is noted. Musculoskeletal: No acute or significant osseous findings. IMPRESSION: Cholelithiasis is noted with 4 cm gallstone noted in neck of gallbladder. Mild gallbladder dilatation is noted with mild surrounding fluid and inflammatory changes concerning for acute cholecystitis. Moderate size fat containing right inguinal hernia. Aortic Atherosclerosis (ICD10-I70.0). Electronically Signed   By: Lupita RaiderJames  Green Jr, M.D.   On: 113-Apr-202019 13:39    EKG: Independently reviewed.  None  Assessment/Plan Present on Admission: . Essential hypertension . Hyperlipidemia . Somnolence . Gall stone in bile duct with infection of gallbladder  Principal Problem:   Gall stone in bile duct with infection of gallbladder Active Problems:   Essential hypertension   Hyperlipidemia   DM type 2 (diabetes mellitus, type 2) (HCC)   H/O: CVA (cerebrovascular accident)   Chronic anticoagulation- for h/o LLE DVT, on Eliquis.   Somnolence   Nephrolithiasis   1.  Generalized abdominal pain  2.  Cholecystitis cholelithiasis.  Multiple gall stones noted with the largest measuring 4 cm in the neck of the gallbladder also mild gallbladder dilatation with surrounding inflammation concerning for cholecystitis.  General surgery has evaluated.  He recommended starting him on broad-spectrum antibiotics and he would do HIDA scan and discuss further management with family patient is comfortable  3.  Fatigue mental status change patient is more somnolent  than usual head CT was negative however  4.  Leukocytosis most likely from his cholecystitis  5.  Type 2 diabetes mellitus uncontrolled patient is said to be allergic to insulin  6.  Hypertension continue home medication  7.  History of CVA stable  Severity of Illness: The appropriate patient status for this patient is INPATIENT. Inpatient status is judged to be reasonable and necessary in order to provide the required intensity of service to ensure the patient's safety. The patient's presenting symptoms, physical exam findings, and initial radiographic and laboratory data in the context of their chronic comorbidities is felt to place them at high risk for further clinical deterioration. Furthermore, it is not anticipated that the patient will be medically stable for discharge from the hospital within 2 midnights of admission. The following factors support the  patient status of inpatient.   " The patient's presenting symptoms include nausea vomiting. " The worrisome physical exam findings include cholelithiasis. " The initial radiographic and laboratory data are worrisome because of leukocytosis gallstone. " The chronic co-morbidities include recent stroke.   * I certify that at the point of admission it is my clinical judgment that the patient will require inpatient hospital care spanning beyond 2 midnights from the point of admission due to high intensity of service, high risk for further deterioration and high frequency of surveillance required.*    DVT prophylaxis: Lovenox  Code Status: Full  Family Communication: Daughter, Dorann Lodge and granddaughter at bedside  Disposition Plan: Home when stable  Consults called: Dr. Robyne Askew general surgery  Admission status: Inpatient    Myrtie Neither MD [facility] Pager [phone]  If 7PM-7AM, please contact night-coverage [email or web address] Password [facility]  07/15/2018, 4:56 PM

## 2018-07-15 NOTE — ED Notes (Signed)
Patient transported to CT 

## 2018-07-15 NOTE — ED Notes (Signed)
I attempted report rn not available will call back

## 2018-07-16 ENCOUNTER — Inpatient Hospital Stay (HOSPITAL_COMMUNITY): Payer: Medicare HMO

## 2018-07-16 LAB — SURGICAL PCR SCREEN
MRSA, PCR: NEGATIVE
Staphylococcus aureus: NEGATIVE

## 2018-07-16 LAB — BASIC METABOLIC PANEL
Anion gap: 14 (ref 5–15)
BUN: 9 mg/dL (ref 8–23)
CALCIUM: 9.1 mg/dL (ref 8.9–10.3)
CO2: 23 mmol/L (ref 22–32)
Chloride: 100 mmol/L (ref 98–111)
Creatinine, Ser: 1.11 mg/dL (ref 0.61–1.24)
GFR calc non Af Amer: 58 mL/min — ABNORMAL LOW (ref >60)
Glucose, Bld: 328 mg/dL — ABNORMAL HIGH (ref 70–99)
Potassium: 3.6 mmol/L (ref 3.5–5.1)
Sodium: 137 mmol/L (ref 135–145)

## 2018-07-16 LAB — URINE CULTURE: Culture: NO GROWTH

## 2018-07-16 LAB — CBC
HCT: 38.6 % — ABNORMAL LOW (ref 39.0–52.0)
Hemoglobin: 12.2 g/dL — ABNORMAL LOW (ref 13.0–17.0)
MCH: 25.4 pg — ABNORMAL LOW (ref 26.0–34.0)
MCHC: 31.6 g/dL (ref 30.0–36.0)
MCV: 80.4 fL (ref 80.0–100.0)
NRBC: 0 % (ref 0.0–0.2)
PLATELETS: 174 10*3/uL (ref 150–400)
RBC: 4.8 MIL/uL (ref 4.22–5.81)
RDW: 15.3 % (ref 11.5–15.5)
WBC: 23.9 10*3/uL — ABNORMAL HIGH (ref 4.0–10.5)

## 2018-07-16 LAB — GLUCOSE, CAPILLARY
GLUCOSE-CAPILLARY: 339 mg/dL — AB (ref 70–99)
GLUCOSE-CAPILLARY: 279 mg/dL — AB (ref 70–99)
Glucose-Capillary: 254 mg/dL — ABNORMAL HIGH (ref 70–99)

## 2018-07-16 LAB — HEMOGLOBIN A1C
Hgb A1c MFr Bld: 9.5 % — ABNORMAL HIGH (ref 4.8–5.6)
Mean Plasma Glucose: 225.95 mg/dL

## 2018-07-16 MED ORDER — TECHNETIUM TC 99M MEBROFENIN IV KIT
5.0000 | PACK | Freq: Once | INTRAVENOUS | Status: AC | PRN
  Administered 2018-07-16: 5 via INTRAVENOUS

## 2018-07-16 NOTE — Progress Notes (Signed)
PROGRESS NOTE  Tom Wang ZOX:[id number] DOB: October 24, 1924 DOA: 101/19/202019 [facility]   LOS: 1 day   Brief Narrative / Interim history: 84 year old male with history of diabetes mellitus, recent CVA few weeks ago, hypertension, who was brought to the hospital on 12/14 due to sudden onset of nausea and vomiting around lunchtime.  He was also reported to be a little bit more confused and lethargic and sleeping a lot.  He has been having abdominal pain since yesterday.  Family also reported fevers and chills at home  Subjective: -Sleeping when I entered the room, however wakes up and answers questions however appears somewhat less interactive.  He can tell me his name, currently denies any pain or nausea  Assessment & Plan: Principal Problem:   Gall stone in bile duct with infection of gallbladder Active Problems:   Essential hypertension   Hyperlipidemia   DM type 2 (diabetes mellitus, type 2) (HCC)   H/O: CVA (cerebrovascular accident)   Chronic anticoagulation- for h/o LLE DVT, on Eliquis.   Somnolence   Nephrolithiasis   Principal Problem Acute cholecystitis -LFTs were normal on admission, he underwent a CT scan of the abdomen and pelvis which showed cholelithiasis with 4 cm gallstone noted in the neck of the gallbladder -General surgery was consulted and patient was started on broad-spectrum antibiotics -Leukocytosis significantly worse today 23.9 -General surgery recommended a HIDA scan, awaiting results  Additional Problems Type 2 diabetes mellitus -Patient has been refusing insulin in the past, states that he is allergic to it, this is confirmed by daughter who is at bedside who tells me that every time he gets insulin he has strokelike symptoms -Hemoglobin A1c in the 9 range, showing poor control -Continue glipizide  History of CVA -Admitted and discharged about a month ago when he presented with slurred speech, MRI at that time showed an acute 14  mm left thalamus and 4 mm left pons nonhemorrhagic infarcts.  He was placed on full dose aspirin.  Hold now while awaiting final surgical decision  Hypertension -Continue losartan, blood pressure on the high side  Hyperlipidemia -Continue statin   Scheduled Meds: . acetaminophen  500 mg Oral Daily  . atorvastatin  40 mg Oral q1800  . cholecalciferol  500 Units Oral QHS  . enoxaparin (LOVENOX) injection  40 mg Subcutaneous Q24H  . glipiZIDE  10 mg Oral BID AC  . losartan  12.5 mg Oral Daily  . pantoprazole  40 mg Oral Daily  . senna-docusate  2 tablet Oral Daily  . vitamin B-12  50 mcg Oral Daily   Continuous Infusions: . sodium chloride 125 mL/hr at 07/15/18 2259  . ertapenem 1,000 mg (07/15/18 2302)   PRN Meds:.  DVT prophylaxis: lovenox  Code Status: Full code Family Communication: Daughter present at bedside Disposition Plan: To be determined  Consultants:   General surgery  Procedures:   None   Antimicrobials:  Ertapenem 12/14 >>  Objective: Vitals:   07/15/18 1730 07/15/18 1902 07/15/18 2115 07/16/18 0424  BP: (!) 147/76 (!) 175/87 (!) 154/85 (!) 172/76  Pulse: 78 87 87 92  Resp: (!) 29 19 18 15   Temp:  98.3 F (36.8 C) 98.3 F (36.8 C) 98.6 F (37 C)  TempSrc:  Oral Oral Oral  SpO2: 97% 100% 100% 100%  Weight:      Height:        Intake/Output Summary (Last 24 hours) at 07/16/2018 1110 Last data filed at 07/16/2018 0427 Gross per 24 hour  Intake 500 ml  Output 600 ml  Net -100 ml   Filed Weights   07/15/18 0454  Weight: (!) 147.4 kg    Examination:  Constitutional: Lethargic does not appear to be in any acute distress Eyes: No scleral icterus ENMT: Mucous membranes are moist.  Neck: normal, supple Respiratory: clear to auscultation bilaterally, no wheezing, no crackles. Normal respiratory effort. No accessory muscle use.  Cardiovascular: Regular rate and rhythm, no murmurs / rubs / gallops.  Trace peripheral edema Abdomen: Soft, no  right upper quadrant tenderness, bowel sounds positive Musculoskeletal: no clubbing / cyanosis Skin: no rashes seen Neurologic: Grossly nonfocal and equal strength, facial droop present (daughter says it is chronic) Psychiatric: Alert to self   Data Reviewed: I have independently reviewed following labs and imaging studies   CBC: Recent Labs  Lab 07/15/18 0528 07/16/18 0246  WBC 16.6* 23.9*  NEUTROABS 12.3*  --   HGB 12.7* 12.2*  HCT 41.9 38.6*  MCV 81.7 80.4  PLT 184 174   Basic Metabolic Panel: Recent Labs  Lab 07/15/18 0528 07/16/18 0246  NA 135 137  K 3.8 3.6  CL 97* 100  CO2 23 23  GLUCOSE 352* 328*  BUN 10 9  CREATININE 1.10 1.11  CALCIUM 9.7 9.1   GFR: Estimated Creatinine Clearance: 67.8 mL/min (by C-G formula based on SCr of 1.11 mg/dL). Liver Function Tests: Recent Labs  Lab 07/15/18 0528  AST 15  ALT 10  ALKPHOS 63  BILITOT 0.9  PROT 7.5  ALBUMIN 3.5   No results for input(s): LIPASE, AMYLASE in the last 168 hours. No results for input(s): AMMONIA in the last 168 hours. Coagulation Profile: No results for input(s): INR, PROTIME in the last 168 hours. Cardiac Enzymes: No results for input(s): CKTOTAL, CKMB, CKMBINDEX, TROPONINI in the last 168 hours. BNP (last 3 results) No results for input(s): PROBNP in the last 8760 hours. HbA1C: Recent Labs    07/16/18 0246  HGBA1C 9.5*   CBG: Recent Labs  Lab 07/15/18 0851 07/15/18 1854 07/15/18 2112 07/16/18 1046  GLUCAP 339* 347* 310* 279*   Lipid Profile: No results for input(s): CHOL, HDL, LDLCALC, TRIG, CHOLHDL, LDLDIRECT in the last 72 hours. Thyroid Function Tests: No results for input(s): TSH, T4TOTAL, FREET4, T3FREE, THYROIDAB in the last 72 hours. Anemia Panel: No results for input(s): VITAMINB12, FOLATE, FERRITIN, TIBC, IRON, RETICCTPCT in the last 72 hours. Urine analysis:    Component Value Date/Time   COLORURINE YELLOW (A) 109-14-2019 1033   APPEARANCEUR CLOUDY (A) 109-14-2019  1033   LABSPEC 1.025 109-14-2019 1033   PHURINE 6.0 109-14-2019 1033   GLUCOSEU 500 (A) 109-14-2019 1033   HGBUR SMALL (A) 109-14-2019 1033   BILIRUBINUR NEGATIVE 109-14-2019 1033   KETONESUR NEGATIVE 109-14-2019 1033   PROTEINUR >300 (A) 109-14-2019 1033   UROBILINOGEN 0.2 12/13/2014 2011   NITRITE NEGATIVE 109-14-2019 1033   LEUKOCYTESUR NEGATIVE 109-14-2019 1033   Sepsis Labs: Invalid input(s): PROCALCITONIN, LACTICIDVEN  Recent Results (from the past 240 hour(s))  Urine culture     Status: None   Collection Time: 07/15/18 10:33 AM  Result Value Ref Range Status   Specimen Description URINE, RANDOM  Final   Special Requests NONE  Final   Culture   Final    NO GROWTH Performed at [facility], [address], [place], [place] [address]    Report Status 07/16/2018 FINAL  Final  Surgical PCR screen     Status: None   Collection Time: 07/16/18  4:23 AM  Result Value Ref Range Status   MRSA, PCR NEGATIVE NEGATIVE Final   Staphylococcus aureus NEGATIVE NEGATIVE Final    Comment: (NOTE) The Xpert SA Assay (FDA approved for NASAL specimens in patients 25 years of age and older), is one component of a comprehensive surveillance program. It is not intended to diagnose infection nor to guide or monitor treatment. Performed at [facility], [address] [place], [place] [address]       Radiology Studies: Dg Chest 2 View  Result Date: 111-28-2019 CLINICAL DATA:  Fever and vomiting. Possible infection. EXAM: CHEST - 2 VIEW COMPARISON:  06/14/2018. FINDINGS: Cardiomegaly. Low lung volumes. No consolidation or edema. No effusion or pneumothorax. Calcified tortuous aorta. IMPRESSION: Cardiomegaly. No active disease. Electronically Signed   By: Elsie Stain M.D.   On: 111-28-2019 07:04   Ct Head Wo Contrast  Result Date: 111-28-2019 CLINICAL DATA:  Fever and vomiting. EXAM: CT HEAD WITHOUT CONTRAST TECHNIQUE: Contiguous axial images were obtained from the base of the skull  through the vertex without intravenous contrast. COMPARISON:  June 14, 2018 FINDINGS: Brain: No subdural, epidural, or subarachnoid hemorrhage. Cerebellum, brainstem, and basal cisterns are normal. Ventricles and sulci are unchanged. White matter changes are stable. No acute cortical ischemia or infarct. No mass effect or midline shift. Vascular: No hyperdense vessel or unexpected calcification. Skull: Normal. Negative for fracture or focal lesion. Sinuses/Orbits: No acute finding. Other: None. IMPRESSION: 1. No acute intracranial abnormalities. No cause for fever or vomiting noted. Electronically Signed   By: Gerome Sam III M.D   On: 111-28-2019 13:36   Ct Abdomen Pelvis W Contrast  Result Date: 111-28-2019 CLINICAL DATA:  Acute generalized abdominal pain.  Vomiting. EXAM: CT ABDOMEN AND PELVIS WITH CONTRAST TECHNIQUE: Multidetector CT imaging of the abdomen and pelvis was performed using the standard protocol following bolus administration of intravenous contrast. CONTRAST:  OMNIPAQUE IOHEXOL 300 MG/ML  SOLN COMPARISON:  None. FINDINGS: Lower chest: Minimal bibasilar posterior subsegmental atelectasis is noted. Hepatobiliary: No biliary dilatation is noted. The liver is unremarkable. Multiple gallstones are noted with the largest measuring 4 cm in the neck of the gallbladder. Mild gallbladder dilatation is noted with mild surrounding fluid and inflammatory changes concerning for acute cholecystitis. Pancreas: Unremarkable. No pancreatic ductal dilatation or surrounding inflammatory changes. Spleen: Normal in size without focal abnormality. Adrenals/Urinary Tract: Adrenal glands are unremarkable. Kidneys are normal, without renal calculi, focal lesion, or hydronephrosis. Bladder is unremarkable. Stomach/Bowel: Stomach is within normal limits. Appendix appears normal. No evidence of bowel wall thickening, distention, or inflammatory changes. Vascular/Lymphatic: Aortic atherosclerosis. No enlarged  abdominal or pelvic lymph nodes. Reproductive: Prostate is unremarkable. Other: Moderate fat containing right inguinal hernia is noted. No ascites is noted. Musculoskeletal: No acute or significant osseous findings. IMPRESSION: Cholelithiasis is noted with 4 cm gallstone noted in neck of gallbladder. Mild gallbladder dilatation is noted with mild surrounding fluid and inflammatory changes concerning for acute cholecystitis. Moderate size fat containing right inguinal hernia. Aortic Atherosclerosis (ICD10-I70.0). Electronically Signed   By: Lupita Raider, M.D.   On: 111-28-2019 13:39     Pamella Pert, MD, PhD [facility] Pager [phone]  If 7PM-7AM, please contact night-coverage [email or web address] Password [email or web address] 07/16/2018, 11:10 AM

## 2018-07-16 NOTE — Evaluation (Signed)
Physical Therapy Evaluation Patient Details Name: Tom Wang MRN: [id number] DOB: 17-May-1925 Today's Date: 07/16/2018   History of Present Illness  Tom Wang is a 84 y.o. male with medical history significant for diabetes mellitus recent stroke hypertension GERD, who was brought by ambulance from home because of sudden onset of nausea and vomiting around lunchtime the day prior to admission, and family reported patient had been less active than usual seem more confused and tired and sleeping a lot.Gall stone in bile duct with infection of gallbladder. General surgery deemed Not optimal operative candidate.  Clinical Impression   Pt admitted with above diagnosis. Pt currently with functional limitations due to the deficits listed below (see PT Problem List). Needing Lots of assistance, dependent for ADLs, and using standing lift for transfers prior to admission; Presents with decr functional mobility, decr balance, pusher-syndrome-like R lean; Family very well-equipped and very much want to take him home at dc;  Pt will benefit from skilled PT to increase their independence and safety with mobility to allow discharge to the venue listed below.       Follow Up Recommendations Home health PT;Supervision/Assistance - 24 hour;Other (comment)(Consider HHOT as well)    Equipment Recommendations  None recommended by PT(very well equipped; consider ambulance transport home)    Recommendations for Other Services       Precautions / Restrictions Precautions Precautions: Fall      Mobility  Bed Mobility Overal bed mobility: Needs Assistance Bed Mobility: Rolling;Sidelying to Sit;Sit to Supine Rolling: +2 for physical assistance;Total assist Sidelying to sit: +2 for physical assistance;Total assist   Sit to supine: +2 for physical assistance;Total assist   General bed mobility comments: Total assist for all aspects of bed mobility  Transfers                     Ambulation/Gait                Stairs            Wheelchair Mobility    Modified Rankin (Stroke Patients Only)       Balance Overall balance assessment: Needs assistance Sitting-balance support: Single extremity supported;Bilateral upper extremity supported;No upper extremity supported;Feet supported Sitting balance-Leahy Scale: Poor Sitting balance - Comments: Sat EOB at least 8-10 minutes with level of assist fluctuating between Total assist and close guard; noted pusher-like tendency to lean R and push with LUE when palm was down on bed; worked on L weight shift, L elbow prop, midline orientation, and overall sitting balance Postural control: Right lateral lean                                   Pertinent Vitals/Pain Pain Assessment: Faces Pain Location: Vocalizes with PROM bil LEs; Family tells me that's his normal Pain Descriptors / Indicators: Moaning Pain Intervention(s): Monitored during session    Home Living Family/patient expects to be discharged to:: Private residence Living Arrangements: Children Available Help at Discharge: Family;Available 24 hours/day Type of Home: Apartment Home Access: Level entry     Home Layout: One level Home Equipment: Wheelchair - manual;Hospital bed;Other (comment)(Sara Plus Lift) Additional Comments: CNA comes 5x week for 3 hours     Prior Function Level of Independence: Needs assistance   Gait / Transfers Assistance Needed: Wheelchair; [place] Dec lift for standing transfers  ADL's / Homemaking Assistance Needed: Dependent for all ADL's  Hand Dominance   Dominant Hand: Right    Extremity/Trunk Assessment   Upper Extremity Assessment Upper Extremity Assessment: RUE deficits/detail;LUE deficits/detail RUE Deficits / Details: Per pt daughter, pt is R hand dominant. Pt daughter reports decreased use of R hand in general; minimal voluntary movement noted, except spontaneous use of RUE to scratch  face once settled at end of session RUE Coordination: decreased fine motor;decreased gross motor LUE Deficits / Details: Non-dominant; minimal voluntary use of LUE during session    Lower Extremity Assessment Lower Extremity Assessment: Generalized weakness;RLE deficits/detail;LLE deficits/detail RLE Deficits / Details: Minimal voluntary movement noted LLE Deficits / Details: Minimal voluntary mvoement noted; incr swelling L ankle compared to R; Daughter tells me this is his baseline       Communication   Communication: Expressive difficulties;HOH  Cognition Arousal/Alertness: Lethargic Behavior During Therapy: WFL for tasks assessed/performed(though sleepy) Overall Cognitive Status: Impaired/Different from baseline                                 General Comments: Less aroused than normal      General Comments General comments (skin integrity, edema, etc.): Session conducted on room air and O2 sats remained greater tahn or equal to 92%    Exercises     Assessment/Plan    PT Assessment Patient needs continued PT services  PT Problem List Decreased strength;Decreased range of motion;Decreased activity tolerance;Decreased balance;Decreased mobility;Decreased coordination;Decreased cognition;Decreased knowledge of use of DME;Decreased safety awareness;Decreased knowledge of precautions;Impaired tone       PT Treatment Interventions DME instruction;Functional mobility training;Therapeutic activities;Therapeutic exercise;Balance training;Neuromuscular re-education;Cognitive remediation;Patient/family education    PT Goals (Current goals can be found in the Care Plan section)  Acute Rehab PT Goals Patient Stated Goal: Did not state; Family very much wants to bring him home PT Goal Formulation: With family Time For Goal Achievement: 07/30/18 Potential to Achieve Goals: Good    Frequency Min 3X/week   Barriers to discharge        Co-evaluation                AM-PAC PT "6 Clicks" Mobility  Outcome Measure Help needed turning from your back to your side while in a flat bed without using bedrails?: Total Help needed moving from lying on your back to sitting on the side of a flat bed without using bedrails?: Total Help needed moving to and from a bed to a chair (including a wheelchair)?: Total Help needed standing up from a chair using your arms (e.g., wheelchair or bedside chair)?: Total Help needed to walk in hospital room?: Total Help needed climbing 3-5 steps with a railing? : Total 6 Click Score: 6    End of Session Equipment Utilized During Treatment: Other (comment)(Bed pad) Activity Tolerance: Patient tolerated treatment well Patient left: in bed;with call bell/phone within reach;with family/visitor present;Other (comment)(bed in chair position) Nurse Communication: Mobility status;Need for lift equipment PT Visit Diagnosis: Unsteadiness on feet (R26.81);Other abnormalities of gait and mobility (R26.89);Muscle weakness (generalized) (M62.81);Other symptoms and signs involving the nervous system (R29.898);Hemiplegia and hemiparesis Hemiplegia - Right/Left: Right Hemiplegia - dominant/non-dominant: Dominant Hemiplegia - caused by: Cerebral infarction    Time: [id number] PT Time Calculation (min) (ACUTE ONLY): 37 min   Charges:   PT Evaluation $PT Eval Moderate Complexity: 1 Mod PT Treatments $Therapeutic Activity: 8-22 mins        Van ClinesHolly Carleigh Buccieri, PT  Acute Rehabilitation Services Pager [phone] Office [phone]  Levi Aland 07/16/2018, 4:51 PM

## 2018-07-16 NOTE — Progress Notes (Signed)
Subjective/Chief Complaint: no pain     Objective: Vital signs in last 24 hours: Temp:  [98.3 F (36.8 C)-98.6 F (37 C)] 98.6 F (37 C) (12/15 0424) Pulse Rate:  [76-97] 92 (12/15 0424) Resp:  [0-30] 15 (12/15 0424) BP: (141-175)/(67-87) 172/76 (12/15 0424) SpO2:  [97 %-100 %] 100 % (12/15 0424) Last BM Date: 07/12/18  Intake/Output from previous day: 12/14 0701 - 12/15 0700 In: 500 [IV Piggyback:500] Out: 600 [Urine:600] Intake/Output this shift: No intake/output data recorded.  GI: soft, non-tender; bowel sounds normal; no masses,  no organomegaly  Lab Results:  Recent Labs    07/15/18 0528 07/16/18 0246  WBC 16.6* 23.9*  HGB 12.7* 12.2*  HCT 41.9 38.6*  PLT 184 174   BMET Recent Labs    07/15/18 0528 07/16/18 0246  NA 135 137  K 3.8 3.6  CL 97* 100  CO2 23 23  GLUCOSE 352* 328*  BUN 10 9  CREATININE 1.10 1.11  CALCIUM 9.7 9.1   PT/INR No results for input(s): LABPROT, INR in the last 72 hours. ABG No results for input(s): PHART, HCO3 in the last 72 hours.  Invalid input(s): PCO2, PO2  Studies/Results: Dg Chest 2 View  Result Date: 07/15/2018 CLINICAL DATA:  Fever and vomiting. Possible infection. EXAM: CHEST - 2 VIEW COMPARISON:  06/14/2018. FINDINGS: Cardiomegaly. Low lung volumes. No consolidation or edema. No effusion or pneumothorax. Calcified tortuous aorta. IMPRESSION: Cardiomegaly. No active disease. Electronically Signed   By: Elsie StainJohn T Curnes M.D.   On: 07/15/2018 07:04   Ct Head Wo Contrast  Result Date: 07/15/2018 CLINICAL DATA:  Fever and vomiting. EXAM: CT HEAD WITHOUT CONTRAST TECHNIQUE: Contiguous axial images were obtained from the base of the skull through the vertex without intravenous contrast. COMPARISON:  June 14, 2018 FINDINGS: Brain: No subdural, epidural, or subarachnoid hemorrhage. Cerebellum, brainstem, and basal cisterns are normal. Ventricles and sulci are unchanged. White matter changes are stable. No acute  cortical ischemia or infarct. No mass effect or midline shift. Vascular: No hyperdense vessel or unexpected calcification. Skull: Normal. Negative for fracture or focal lesion. Sinuses/Orbits: No acute finding. Other: None. IMPRESSION: 1. No acute intracranial abnormalities. No cause for fever or vomiting noted. Electronically Signed   By: Gerome Samavid  Williams III M.D   On: 07/15/2018 13:36   Ct Abdomen Pelvis W Contrast  Result Date: 07/15/2018 CLINICAL DATA:  Acute generalized abdominal pain.  Vomiting. EXAM: CT ABDOMEN AND PELVIS WITH CONTRAST TECHNIQUE: Multidetector CT imaging of the abdomen and pelvis was performed using the standard protocol following bolus administration of intravenous contrast. CONTRAST:  100mL OMNIPAQUE IOHEXOL 300 MG/ML  SOLN COMPARISON:  None. FINDINGS: Lower chest: Minimal bibasilar posterior subsegmental atelectasis is noted. Hepatobiliary: No biliary dilatation is noted. The liver is unremarkable. Multiple gallstones are noted with the largest measuring 4 cm in the neck of the gallbladder. Mild gallbladder dilatation is noted with mild surrounding fluid and inflammatory changes concerning for acute cholecystitis. Pancreas: Unremarkable. No pancreatic ductal dilatation or surrounding inflammatory changes. Spleen: Normal in size without focal abnormality. Adrenals/Urinary Tract: Adrenal glands are unremarkable. Kidneys are normal, without renal calculi, focal lesion, or hydronephrosis. Bladder is unremarkable. Stomach/Bowel: Stomach is within normal limits. Appendix appears normal. No evidence of bowel wall thickening, distention, or inflammatory changes. Vascular/Lymphatic: Aortic atherosclerosis. No enlarged abdominal or pelvic lymph nodes. Reproductive: Prostate is unremarkable. Other: Moderate fat containing right inguinal hernia is noted. No ascites is noted. Musculoskeletal: No acute or significant osseous findings. IMPRESSION: Cholelithiasis is noted with  4 cm gallstone noted in  neck of gallbladder. Mild gallbladder dilatation is noted with mild surrounding fluid and inflammatory changes concerning for acute cholecystitis. Moderate size fat containing right inguinal hernia. Aortic Atherosclerosis (ICD10-I70.0). Electronically Signed   By: Lupita Raider, M.D.   On: 12020/05/2418 13:39    Anti-infectives: Anti-infectives (From admission, onward)   Start     Dose/Rate Route Frequency Ordered Stop   07/15/18 1930  ertapenem (INVANZ) 1,000 mg in sodium chloride 0.9 % 100 mL IVPB    Note to Pharmacy:  Treatment for presumed cholecystitis (Pen allergic)   1 g 200 mL/hr over 30 Minutes Intravenous Every 24 hours 07/15/18 1832     07/15/18 1500  ertapenem (INVANZ) 1,000 mg in sodium chloride 0.9 % 100 mL IVPB    Note to Pharmacy:  Treatment for presumed cholecystitis (Pen allergic)   1 g 200 mL/hr over 30 Minutes Intravenous  Once 07/15/18 1412 07/15/18 1558      Assessment/Plan: Gallstones Work up for cholecystitis  Ok to feed  Benign abdomen on exam  Not optimal operative candidate  Discussed with family    LOS: 1 day    Tom Wang 07/16/2018

## 2018-07-17 ENCOUNTER — Encounter (HOSPITAL_COMMUNITY): Payer: Self-pay | Admitting: Interventional Radiology

## 2018-07-17 ENCOUNTER — Inpatient Hospital Stay (HOSPITAL_COMMUNITY): Payer: Medicare HMO

## 2018-07-17 HISTORY — PX: IR PERC CHOLECYSTOSTOMY: IMG2326

## 2018-07-17 LAB — COMPREHENSIVE METABOLIC PANEL
ALBUMIN: 2.7 g/dL — AB (ref 3.5–5.0)
ALT: 12 U/L (ref 0–44)
AST: 15 U/L (ref 15–41)
Alkaline Phosphatase: 70 U/L (ref 38–126)
Anion gap: 8 (ref 5–15)
BUN: 8 mg/dL (ref 8–23)
CO2: 26 mmol/L (ref 22–32)
Calcium: 9 mg/dL (ref 8.9–10.3)
Chloride: 104 mmol/L (ref 98–111)
Creatinine, Ser: 0.96 mg/dL (ref 0.61–1.24)
GFR calc Af Amer: >60 mL/min (ref >60)
GFR calc non Af Amer: >60 mL/min (ref >60)
Glucose, Bld: 283 mg/dL — ABNORMAL HIGH (ref 70–99)
Potassium: 3.5 mmol/L (ref 3.5–5.1)
Sodium: 138 mmol/L (ref 135–145)
Total Bilirubin: 0.7 mg/dL (ref 0.3–1.2)
Total Protein: 6.8 g/dL (ref 6.5–8.1)

## 2018-07-17 LAB — CBC
HCT: 38.5 % — ABNORMAL LOW (ref 39.0–52.0)
Hemoglobin: 11.6 g/dL — ABNORMAL LOW (ref 13.0–17.0)
MCH: 24.7 pg — AB (ref 26.0–34.0)
MCHC: 30.1 g/dL (ref 30.0–36.0)
MCV: 81.9 fL (ref 80.0–100.0)
Platelets: 176 10*3/uL (ref 150–400)
RBC: 4.7 MIL/uL (ref 4.22–5.81)
RDW: 15.5 % (ref 11.5–15.5)
WBC: 22 10*3/uL — ABNORMAL HIGH (ref 4.0–10.5)
nRBC: 0 % (ref 0.0–0.2)

## 2018-07-17 LAB — GLUCOSE, CAPILLARY
Glucose-Capillary: 244 mg/dL — ABNORMAL HIGH (ref 70–99)
Glucose-Capillary: 297 mg/dL — ABNORMAL HIGH (ref 70–99)
Glucose-Capillary: 230 mg/dL — ABNORMAL HIGH (ref 70–99)
Glucose-Capillary: 239 mg/dL — ABNORMAL HIGH (ref 70–99)
Glucose-Capillary: 257 mg/dL — ABNORMAL HIGH (ref 70–99)
Glucose-Capillary: 266 mg/dL — ABNORMAL HIGH (ref 70–99)

## 2018-07-17 LAB — PROTIME-INR
INR: 1.06
Prothrombin Time: 13.7 seconds (ref 11.4–15.2)

## 2018-07-17 MED ORDER — FENTANYL CITRATE (PF) 100 MCG/2ML IJ SOLN
INTRAMUSCULAR | Status: AC
  Filled 2018-07-17: qty 2

## 2018-07-17 MED ORDER — KETOROLAC TROMETHAMINE 15 MG/ML IJ SOLN
15.0000 mg | Freq: Three times a day (TID) | INTRAMUSCULAR | Status: AC | PRN
  Filled 2018-07-17: qty 1

## 2018-07-17 MED ORDER — CLONIDINE HCL 0.2 MG/24HR TD PTWK
0.2000 mg | MEDICATED_PATCH | TRANSDERMAL | Status: DC
  Administered 2018-07-17: 0.2 mg via TRANSDERMAL
  Filled 2018-07-17: qty 1

## 2018-07-17 MED ORDER — HYDRALAZINE HCL 20 MG/ML IJ SOLN
10.0000 mg | INTRAMUSCULAR | Status: DC | PRN
  Administered 2018-07-17 – 2018-07-19 (×4): 10 mg via INTRAVENOUS
  Filled 2018-07-17 (×5): qty 1

## 2018-07-17 MED ORDER — ENOXAPARIN SODIUM 80 MG/0.8ML ~~LOC~~ SOLN: 70.0000 mg | SUBCUTANEOUS | Status: DC

## 2018-07-17 MED ORDER — LIDOCAINE HCL 1 % IJ SOLN
INTRAMUSCULAR | Status: AC
  Filled 2018-07-17: qty 20

## 2018-07-17 MED ORDER — MIDAZOLAM HCL 2 MG/2ML IJ SOLN
INTRAMUSCULAR | Status: AC
  Filled 2018-07-17: qty 2

## 2018-07-17 MED ORDER — VANCOMYCIN HCL IN DEXTROSE 1-5 GM/200ML-% IV SOLN
1000.0000 mg | INTRAVENOUS | Status: AC
  Administered 2018-07-17: 1000 mg via INTRAVENOUS
  Filled 2018-07-17: qty 200

## 2018-07-17 MED ORDER — AMLODIPINE BESYLATE 10 MG PO TABS
10.0000 mg | ORAL_TABLET | Freq: Every day | ORAL | Status: DC
  Administered 2018-07-17 – 2018-07-19 (×2): 10 mg via ORAL
  Filled 2018-07-17 (×2): qty 1

## 2018-07-17 MED ORDER — FENTANYL CITRATE (PF) 100 MCG/2ML IJ SOLN
INTRAMUSCULAR | Status: AC | PRN
  Administered 2018-07-17: 25 ug via INTRAVENOUS
  Administered 2018-07-17: 50 ug via INTRAVENOUS

## 2018-07-17 MED ORDER — KETOROLAC TROMETHAMINE 15 MG/ML IJ SOLN
15.0000 mg | Freq: Once | INTRAMUSCULAR | Status: AC
  Administered 2018-07-17: 15 mg via INTRAVENOUS
  Filled 2018-07-17: qty 1

## 2018-07-17 MED ORDER — CLONIDINE HCL 0.1 MG PO TABS
0.1000 mg | ORAL_TABLET | Freq: Once | ORAL | Status: DC
  Filled 2018-07-17: qty 1

## 2018-07-17 MED ORDER — ENOXAPARIN SODIUM 40 MG/0.4ML ~~LOC~~ SOLN: 40.0000 mg | SUBCUTANEOUS | Status: DC

## 2018-07-17 MED ORDER — VANCOMYCIN HCL IN DEXTROSE 1-5 GM/200ML-% IV SOLN
INTRAVENOUS | Status: AC
  Filled 2018-07-17: qty 200

## 2018-07-17 MED ORDER — IOPAMIDOL (ISOVUE-300) INJECTION 61%
INTRAVENOUS | Status: AC
  Administered 2018-07-17: 10 mL
  Filled 2018-07-17: qty 50

## 2018-07-17 MED ORDER — LIDOCAINE 5 % EX PTCH
1.0000 | MEDICATED_PATCH | CUTANEOUS | Status: DC
  Administered 2018-07-17: 1 via TRANSDERMAL
  Filled 2018-07-17 (×6): qty 1

## 2018-07-17 MED ORDER — MIDAZOLAM HCL 2 MG/2ML IJ SOLN
INTRAMUSCULAR | Status: AC | PRN
  Administered 2018-07-17 (×2): 0.5 mg via INTRAVENOUS

## 2018-07-17 MED ORDER — LIDOCAINE HCL 1 % IJ SOLN
INTRAMUSCULAR | Status: AC | PRN
  Administered 2018-07-17: 10 mL

## 2018-07-17 MED ORDER — SODIUM CHLORIDE 0.9% FLUSH
5.0000 mL | Freq: Three times a day (TID) | INTRAVENOUS | Status: DC
  Administered 2018-07-17 – 2018-07-27 (×25): 5 mL

## 2018-07-17 NOTE — Progress Notes (Signed)
Inpatient Diabetes Program Recommendations  AACE/ADA: New Consensus Statement on Inpatient Glycemic Control (2015)  Target Ranges:  Prepandial:   less than 140 mg/dL      Peak postprandial:   less than 180 mg/dL (1-2 hours)      Critically ill patients:  140 - 180 mg/dL   Lab Results  Component Value Date   GLUCAP 230 (H) 07/17/2018   HGBA1C 9.5 (H) 07/16/2018    Review of Glycemic Control  Diabetes history: DM2 Outpatient Diabetes medications: Glucotrol 10 mg bid Current orders for Inpatient glycemic control: Glucotrol 10 mg bid  Inpatient Diabetes Program Recommendations:   Noted patient does not take insulin while in the hospital due to ?allergy that causes stroke like symptoms. While in the hospital and NPO, D/C Glucotrol  Thank you, Billy FischerJudy E. Taylor Spilde, RN, MSN, CDE  Diabetes Coordinator Inpatient Glycemic Control Team Team Pager [phone] (8am-5pm) 07/17/2018 11:08 AM

## 2018-07-17 NOTE — Progress Notes (Signed)
PROGRESS NOTE  Tom Wang WUJ:[id number] DOB: [birth year] DOA: 07/15/2018 PCP: [name]   LOS: 2 days   Brief Narrative / Interim history: 84 year old male with history of diabetes mellitus, recent CVA few weeks ago, hypertension, who was brought to the hospital on 12/14 due to sudden onset of nausea and vomiting around lunchtime.  He was also reported to be a little bit more confused and lethargic and sleeping a lot.  He has been having abdominal pain since yesterday.  Family also reported fevers and chills at home  Subjective: -Does not complain of significant pain when I entered the room, keeps his eyes closed during the interview but answers questions.  Daughter is at bedside  Assessment & Plan: Principal Problem:   Gall stone in bile duct with infection of gallbladder Active Problems:   Essential hypertension   Hyperlipidemia   DM type 2 (diabetes mellitus, type 2) (HCC)   H/O: CVA (cerebrovascular accident)   Chronic anticoagulation- for h/o LLE DVT, on Eliquis.   Somnolence   Nephrolithiasis   Principal Problem Acute cholecystitis -LFTs were normal on admission, he underwent a CT scan of the abdomen and pelvis which showed cholelithiasis with 4 cm gallstone noted in the neck of the gallbladder -General surgery was consulted and patient was started on broad-spectrum antibiotics -HIDA scan was positive for cholecystitis, patient with significant leukocytosis, continue antibiotics, I have discussed with general surgery and further management per them  Additional Problems Type 2 diabetes mellitus -Patient has been refusing insulin in the past, states that he is allergic to it, this is confirmed by daughter who is at bedside who tells me that every time he gets insulin he has strokelike symptoms -Hemoglobin A1c in the 9 range, showing poor control -Continue glipizide, CBGs in the hospital are suboptimally controlled however family does not want  insulin  History of CVA -Admitted and discharged about a month ago when he presented with slurred speech, MRI at that time showed an acute 14 mm left thalamus and 4 mm left pons nonhemorrhagic infarcts.  Resume aspirin as soon as surgical decision has been made  Hypertension -Continue losartan  Hyperlipidemia -Continue statin   Scheduled Meds: . acetaminophen  500 mg Oral Daily  . atorvastatin  40 mg Oral q1800  . cholecalciferol  500 Units Oral QHS  . enoxaparin (LOVENOX) injection  40 mg Subcutaneous Q24H  . glipiZIDE  10 mg Oral BID AC  . losartan  12.5 mg Oral Daily  . pantoprazole  40 mg Oral Daily  . senna-docusate  2 tablet Oral Daily  . vitamin B-12  50 mcg Oral Daily   Continuous Infusions: . sodium chloride 125 mL/hr at 07/17/18 0656  . ertapenem 1,000 mg (07/16/18 2111)   PRN Meds:.  DVT prophylaxis: lovenox  Code Status: Full code Family Communication: Daughter present at bedside Disposition Plan: To be determined  Consultants:   General surgery  Procedures:   None   Antimicrobials:  Ertapenem 12/14 >>  Objective: Vitals:   07/16/18 0424 07/16/18 1644 07/16/18 2127 07/17/18 0539  BP: (!) 172/76 (!) 141/73 (!) 156/73 (!) 161/68  Pulse: 92 79 92 79  Resp: 15 18 18 18   Temp: 98.6 F (37 C) 98.9 F (37.2 C) 98.8 F (37.1 C) 98.9 F (37.2 C)  TempSrc: Oral Oral  Oral  SpO2: 100% 96% 96% 99%  Weight:      Height:        Intake/Output Summary (Last 24 hours) at 07/17/2018 1109 Last  data filed at 07/17/2018 0537 Gross per 24 hour  Intake 2275.56 ml  Output 800 ml  Net 1475.56 ml   Filed Weights   07/15/18 0454  Weight: (!) 147.4 kg    Examination:  Constitutional: No distress, keeps his eyes closed ENMT: Moist mucous membranes Neck: Normal, supple Respiratory: Clear to auscultation bilaterally on anterior fields, no wheezing or crackles heard.  Normal respiratory effort Cardiovascular: Regular rate and rhythm, no murmurs  appreciated.  Trace edema Abdomen: Soft, nontender, bowel sounds positive Musculoskeletal: No clubbing or cyanosis seen Skin: No rashes appreciated Neurologic: Without significant focal deficits, chronic facial droop present Psychiatric: Alert to self   Data Reviewed: I have independently reviewed following labs and imaging studies   CBC: Recent Labs  Lab 07/15/18 0528 07/16/18 0246 07/17/18 0145  WBC 16.6* 23.9* 22.0*  NEUTROABS 12.3*  --   --   HGB 12.7* 12.2* 11.6*  HCT 41.9 38.6* 38.5*  MCV 81.7 80.4 81.9  PLT 184 174 176   Basic Metabolic Panel: Recent Labs  Lab 07/15/18 0528 07/16/18 0246 07/17/18 0145  NA 135 137 138  K 3.8 3.6 3.5  CL 97* 100 104  CO2 23 23 26   GLUCOSE 352* 328* 283*  BUN 10 9 8   CREATININE 1.10 1.11 0.96  CALCIUM 9.7 9.1 9.0   GFR: Estimated Creatinine Clearance: 78.3 mL/min (by C-G formula based on SCr of 0.96 mg/dL). Liver Function Tests: Recent Labs  Lab 07/15/18 0528 07/17/18 0145  AST 15 15  ALT 10 12  ALKPHOS 63 70  BILITOT 0.9 0.7  PROT 7.5 6.8  ALBUMIN 3.5 2.7*   No results for input(s): LIPASE, AMYLASE in the last 168 hours. No results for input(s): AMMONIA in the last 168 hours. Coagulation Profile: No results for input(s): INR, PROTIME in the last 168 hours. Cardiac Enzymes: No results for input(s): CKTOTAL, CKMB, CKMBINDEX, TROPONINI in the last 168 hours. BNP (last 3 results) No results for input(s): PROBNP in the last 8760 hours. HbA1C: Recent Labs    07/16/18 0246  HGBA1C 9.5*   CBG: Recent Labs  Lab 07/16/18 1046 07/16/18 1229 07/16/18 1734 07/16/18 2204 07/17/18 0812  GLUCAP 279* 254* 244* 297* 230*   Lipid Profile: No results for input(s): CHOL, HDL, LDLCALC, TRIG, CHOLHDL, LDLDIRECT in the last 72 hours. Thyroid Function Tests: No results for input(s): TSH, T4TOTAL, FREET4, T3FREE, THYROIDAB in the last 72 hours. Anemia Panel: No results for input(s): VITAMINB12, FOLATE, FERRITIN, TIBC, IRON,  RETICCTPCT in the last 72 hours. Urine analysis:    Component Value Date/Time   COLORURINE YELLOW (A) 103/13/202019 1033   APPEARANCEUR CLOUDY (A) 103/13/202019 1033   LABSPEC 1.025 103/13/202019 1033   PHURINE 6.0 103/13/202019 1033   GLUCOSEU 500 (A) 103/13/202019 1033   HGBUR SMALL (A) 103/13/202019 1033   BILIRUBINUR NEGATIVE 103/13/202019 1033   KETONESUR NEGATIVE 103/13/202019 1033   PROTEINUR >300 (A) 103/13/202019 1033   UROBILINOGEN 0.2 12/13/2014 2011   NITRITE NEGATIVE 103/13/202019 1033   LEUKOCYTESUR NEGATIVE 103/13/202019 1033   Sepsis Labs: Invalid input(s): PROCALCITONIN, LACTICIDVEN  Recent Results (from the past 240 hour(s))  Blood culture (routine x 2)     Status: None (Preliminary result)   Collection Time: 07/15/18  5:30 AM  Result Value Ref Range Status   Specimen Description BLOOD LEFT ARM  Final   Special Requests   Final    BOTTLES DRAWN AEROBIC ONLY Blood Culture adequate volume   Culture   Final    NO GROWTH 2  DAYS Performed at [facility], [address], [place], [place] [address]    Report Status PENDING  Incomplete  Blood culture (routine x 2)     Status: None (Preliminary result)   Collection Time: 07/15/18  5:45 AM  Result Value Ref Range Status   Specimen Description BLOOD LEFT HAND  Final   Special Requests   Final    BOTTLES DRAWN AEROBIC AND ANAEROBIC Blood Culture adequate volume   Culture   Final    NO GROWTH 2 DAYS Performed at [facility], [address] [place], [place] [address]    Report Status PENDING  Incomplete  Urine culture     Status: None   Collection Time: 07/15/18 10:33 AM  Result Value Ref Range Status   Specimen Description URINE, RANDOM  Final   Special Requests NONE  Final   Culture   Final    NO GROWTH Performed at [facility], [address], [place], [place] [address]    Report Status 07/16/2018 FINAL  Final  Surgical PCR screen     Status: None   Collection Time: 07/16/18  4:23 AM  Result Value Ref Range Status    MRSA, PCR NEGATIVE NEGATIVE Final   Staphylococcus aureus NEGATIVE NEGATIVE Final    Comment: (NOTE) The Xpert SA Assay (FDA approved for NASAL specimens in patients 77 years of age and older), is one component of a comprehensive surveillance program. It is not intended to diagnose infection nor to guide or monitor treatment. Performed at [facility], [address] [place], [place] [address]       Radiology Studies: Ct Head Wo Contrast  Result Date: 07/15/2018 CLINICAL DATA:  Fever and vomiting. EXAM: CT HEAD WITHOUT CONTRAST TECHNIQUE: Contiguous axial images were obtained from the base of the skull through the vertex without intravenous contrast. COMPARISON:  June 14, 2018 FINDINGS: Brain: No subdural, epidural, or subarachnoid hemorrhage. Cerebellum, brainstem, and basal cisterns are normal. Ventricles and sulci are unchanged. White matter changes are stable. No acute cortical ischemia or infarct. No mass effect or midline shift. Vascular: No hyperdense vessel or unexpected calcification. Skull: Normal. Negative for fracture or focal lesion. Sinuses/Orbits: No acute finding. Other: None. IMPRESSION: 1. No acute intracranial abnormalities. No cause for fever or vomiting noted. Electronically Signed   By: Gerome Sam III M.D   On: 07/15/2018 13:36   Nm Hepatobiliary Liver Func  Result Date: 07/16/2018 CLINICAL DATA:  Abnormal CT scan.  Abdominal pain. EXAM: NUCLEAR MEDICINE HEPATOBILIARY IMAGING TECHNIQUE: Sequential images of the abdomen were obtained out to 60 minutes following intravenous administration of radiopharmaceutical. RADIOPHARMACEUTICALS:  5.3 mCi Tc-77m  Choletec IV COMPARISON:  None. FINDINGS: Prompt uptake and biliary excretion of activity by the liver is seen. The gallbladder does not fill during 2 hours of imaging. Biliary activity passes into small bowel, consistent with patent common bile duct. IMPRESSION: The gallbladder does not fill during 2 hours of  imaging. Given the CT findings, findings are most consistent with acute cholecystitis. Lack of gallbladder filling can also be seen in the setting of chronic cholecystitis, recent meal, prolonged fasting, or severe acute illness. Electronically Signed   By: Gerome Sam III M.D   On: 07/16/2018 12:02   Ct Abdomen Pelvis W Contrast  Result Date: 07/15/2018 CLINICAL DATA:  Acute generalized abdominal pain.  Vomiting. EXAM: CT ABDOMEN AND PELVIS WITH CONTRAST TECHNIQUE: Multidetector CT imaging of the abdomen and pelvis was performed using the  standard protocol following bolus administration of intravenous contrast. CONTRAST:  OMNIPAQUE IOHEXOL 300 MG/ML  SOLN COMPARISON:  None. FINDINGS: Lower chest: Minimal bibasilar posterior subsegmental atelectasis is noted. Hepatobiliary: No biliary dilatation is noted. The liver is unremarkable. Multiple gallstones are noted with the largest measuring 4 cm in the neck of the gallbladder. Mild gallbladder dilatation is noted with mild surrounding fluid and inflammatory changes concerning for acute cholecystitis. Pancreas: Unremarkable. No pancreatic ductal dilatation or surrounding inflammatory changes. Spleen: Normal in size without focal abnormality. Adrenals/Urinary Tract: Adrenal glands are unremarkable. Kidneys are normal, without renal calculi, focal lesion, or hydronephrosis. Bladder is unremarkable. Stomach/Bowel: Stomach is within normal limits. Appendix appears normal. No evidence of bowel wall thickening, distention, or inflammatory changes. Vascular/Lymphatic: Aortic atherosclerosis. No enlarged abdominal or pelvic lymph nodes. Reproductive: Prostate is unremarkable. Other: Moderate fat containing right inguinal hernia is noted. No ascites is noted. Musculoskeletal: No acute or significant osseous findings. IMPRESSION: Cholelithiasis is noted with 4 cm gallstone noted in neck of gallbladder. Mild gallbladder dilatation is noted with mild surrounding  fluid and inflammatory changes concerning for acute cholecystitis. Moderate size fat containing right inguinal hernia. Aortic Atherosclerosis (ICD10-I70.0). Electronically Signed   By: Lupita Raider, M.D.   On: 12020-07-718 13:39    Pamella Pert, MD, PhD [facility] Pager [phone]  If 7PM-7AM, please contact night-coverage [email or web address] Password [email or web address] 07/17/2018, 11:09 AM

## 2018-07-17 NOTE — Sedation Documentation (Signed)
Upon arrival to IR, Patient is hard to arouse and alert only to self

## 2018-07-17 NOTE — Progress Notes (Signed)
CC: Abdominal pain, nausea and vomiting/increased confusion/lethargy  Subjective: Patient is somnolent in the room.  He is clenched his eyes once but that is about the only response I got out of him during the entire time I was in the room.  He has a daughter with him currently and she said he had some pain but she was not sure whether it was abdominal or knee pain.  He is normally not this sleepy at home.  His activity is pretty much bed to wheelchair since his stroke 5 years ago.  Another family member was on the phone when I was talking with the patient.  Both say he is done well with the liquids, no increased pain.  They were also concerned that he has not had a recent bowel movement.  Objective: Vital signs in last 24 hours: Temp:  [98.8 F (37.1 C)-98.9 F (37.2 C)] 98.9 F (37.2 C) (12/16 0539) Pulse Rate:  [79-92] 79 (12/16 0539) Resp:  [18] 18 (12/16 0539) BP: (141-161)/(68-73) 161/68 (12/16 0539) SpO2:  [96 %-99 %] 99 % (12/16 0539) Last BM Date: 07/12/18 120 p.o. 2100 IV 800 urine recorded Afebrile vital signs are stable Glucose 283, albumin 2.7 -remainder of the CMP is normal WBC 22K H&H stable  Intake/Output from previous day: 12/15 0701 - 12/16 0700 In: 2275.6 [P.O.:120; I.V.:1955.6; IV Piggyback:200] Out: 800 [Urine:800] Intake/Output this shift: No intake/output data recorded.   Patient is somnolent he does not open his eyes.,  He did not really speak he groaned x 1 during the exam. Resp: clear to auscultation bilaterally GI: Soft, nontender, few bowel sounds.  He groaned once while I was palpating the left side of his abdomen he did not seem to be tender in the right upper quadrant on exam.  Lab Results:  Recent Labs    07/16/18 0246 07/17/18 0145  WBC 23.9* 22.0*  HGB 12.2* 11.6*  HCT 38.6* 38.5*  PLT 174 176    BMET Recent Labs    07/16/18 0246 07/17/18 0145  NA 137 138  K 3.6 3.5  CL 100 104  CO2 23 26  GLUCOSE 328* 283*  BUN 9 8   CREATININE 1.11 0.96  CALCIUM 9.1 9.0   PT/INR No results for input(s): LABPROT, INR in the last 72 hours.  Recent Labs  Lab 07/15/18 0528 07/17/18 0145  AST 15 15  ALT 10 12  ALKPHOS 63 70  BILITOT 0.9 0.7  PROT 7.5 6.8  ALBUMIN 3.5 2.7*     Lipase  No results found for: LIPASE   Prior to Admission medications   Medication Sig Start Date End Date Taking? Authorizing Provider  acetaminophen (TYLENOL) 500 MG tablet Take 500 mg by mouth daily.    Yes [provider]  Ascorbic Acid (VITAMIN C PO) Take 1 tablet by mouth daily.   Yes [provider]  aspirin 325 MG tablet Take 1 tablet (325 mg total) by mouth daily. 06/17/18  Yes Mikhail, Nita SellsMaryann, DO  atorvastatin (LIPITOR) 40 MG tablet Take 1 tablet (40 mg total) by mouth daily at 6 PM. 06/17/18  Yes Mikhail, [place] AcresMaryann, DO  cholecalciferol (VITAMIN D) 1000 UNITS tablet Take 500 Units by mouth at bedtime.    Yes [provider]  Cyanocobalamin (VITAMIN B-12 PO) Take 1 tablet by mouth daily.   Yes [provider]  glipiZIDE (GLUCOTROL) 10 MG tablet Take 10 mg by mouth 2 (two) times daily before a meal.   Yes [provider]  losartan (COZAAR) 25 MG tablet Take 0.5 tablets (12.5 mg total) by mouth daily. 06/22/18 07/22/18 Yes Mikhail, Moore, DO  Misc Natural Products (TURMERIC CURCUMIN) CAPS Take 1 capsule by mouth daily.    Yes [provider]  omeprazole (PRILOSEC) 20 MG capsule Take 20 mg by mouth daily.   Yes [provider]  senna-docusate (SENOKOT-S) 8.6-50 MG tablet Take 2 tablets by mouth daily.   Yes [provider]    Medications: . acetaminophen  500 mg Oral Daily  . atorvastatin  40 mg Oral q1800  . cholecalciferol  500 Units Oral QHS  . enoxaparin (LOVENOX) injection  40 mg Subcutaneous Q24H  . glipiZIDE  10 mg Oral BID AC  . losartan  12.5 mg Oral Daily  . pantoprazole  40 mg Oral Daily  . senna-docusate  2 tablet Oral Daily  . vitamin B-12   50 mcg Oral Daily   . sodium chloride 125 mL/hr at 07/17/18 0656  . ertapenem 1,000 mg (07/16/18 2111)   Anti-infectives (From admission, onward)   Start     Dose/Rate Route Frequency Ordered Stop   07/15/18 1930  ertapenem (INVANZ) 1,000 mg in sodium chloride 0.9 % 100 mL IVPB    Note to Pharmacy:  Treatment for presumed cholecystitis (Pen allergic)   1 g 200 mL/hr over 30 Minutes Intravenous Every 24 hours 07/15/18 1832     07/15/18 1500  ertapenem (INVANZ) 1,000 mg in sodium chloride 0.9 % 100 mL IVPB    Note to Pharmacy:  Treatment for presumed cholecystitis (Pen allergic)   1 g 200 mL/hr over 30 Minutes Intravenous  Once 07/15/18 1412 07/15/18 1558      Assessment/Plan Somnolent  CVA left 2014, right Nov 2019  - bed to wheel chair at home Hypertension Type 2 diabetes    Cholelithiasis -with positive HIDA scan  - HIDA positive  - WBC 16.6  >> 23.9 >> 22.0K today   FEN:  IV fluids/full liquids ID:  Invanz 12/14 =>> day 3 DVT: Lovenox Follow up:  TBD   Plan: I will review with Dr. Luisa Hart and see about IR drainage.  LOS: 2 days    Tom Wang 07/17/2018 [phone]

## 2018-07-17 NOTE — Procedures (Signed)
10 Fr Cholecystostomy Frank pus EBL 0 Comp 0

## 2018-07-17 NOTE — Progress Notes (Signed)
07/17/18 1400  Clinical Encounter Type  Visited With Patient and family together  Visit Type Initial;Spiritual support  Referral From Nurse;Family;Patient  Spiritual Encounters  Spiritual Needs Prayer;Emotional  Stress Factors  Patient Stress Factors Loss of control;Exhausted   Responded to consult entered by RN.  Pt and family had requested that pt be allowed to go to hospital chapel, but medical team said no.  Chaplain met w/ pt and 2 family members.  They emphasized they he wanted to go to the chapel, not see a chaplain.  Chaplain explained that since that had been disallowed, wanted to meet w/ pt and also see if there was some way to bring elements of chapel to his room.  Pt speaking very little and his family had a difficult time understanding him.  At request, prayed w/ pt and his family.  Also will do my best to f/u tomorrow.  Pls page chaplain in meantime if return desired sooner.  Family and pt appreciative of visit.  Andrea M Davis Chaplain resident, [phone] 

## 2018-07-17 NOTE — Consult Note (Signed)
Chief Complaint: Patient was seen in consultation today for percutaneous cholecystostomy drain placement Chief Complaint  Patient presents with  . Fever   at the request of Dr Warren Lacy  Supervising Physician: Jolaine Click  Patient Status: [facility] - In-pt  History of Present Illness: Tom Wang is a 84 y.o. male   N/V abd pain for days Worsening sxs Leukocytosis +HIDA  IMPRESSION: The gallbladder does not fill during 2 hours of imaging. Given the CT findings, findings are most consistent with acute cholecystitis. Lack of gallbladder filling can also be seen in the setting of chronic cholecystitis, recent meal, prolonged fasting, or severe acute illness.  CT 2 days ago:  IMPRESSION: Cholelithiasis is noted with 4 cm gallstone noted in neck of gallbladder. Mild gallbladder dilatation is noted with mild surrounding fluid and inflammatory changes concerning for acute cholecystitis  Pt does seem to communicate to family Groggy and sleepy in room  Deemed not surgical candidate per [facility] Request for IR drain  Past Medical History:  Diagnosis Date  . Arthritis    ankles  . Diabetes mellitus    oral meds only  . GERD (gastroesophageal reflux disease)   . Hypertension   . Pneumonia    20 years ago .  Marland Kitchen Stroke [facility])     Past Surgical History:  Procedure Laterality Date  . CATARACT EXTRACTION W/PHACO  04/26/2012   Procedure: CATARACT EXTRACTION PHACO AND INTRAOCULAR LENS PLACEMENT (IOC);  Surgeon: Chalmers Guest, MD;  Location: [facility] OR;  Service: Ophthalmology;  Laterality: Right;  . TONSILLECTOMY    . TUMOR EXCISION     60 years ago .. right forearm .     Allergies: Morphine and related; Other; Gabapentin; Insulins; Lantus [insulin glargine]; and Penicillins  Medications: Prior to Admission medications   Medication Sig Start Date End Date Taking? Authorizing Provider  acetaminophen (TYLENOL) 500 MG tablet Take 500 mg by mouth daily.    Yes [provider]  Ascorbic Acid (VITAMIN C PO) Take 1 tablet by mouth daily.   Yes [provider]  aspirin 325 MG tablet Take 1 tablet (325 mg total) by mouth daily. 06/17/18  Yes Mikhail, Nita Sells, DO  atorvastatin (LIPITOR) 40 MG tablet Take 1 tablet (40 mg total) by mouth daily at 6 PM. 06/17/18  Yes Mikhail, [place], DO  cholecalciferol (VITAMIN D) 1000 UNITS tablet Take 500 Units by mouth at bedtime.    Yes [provider]  Cyanocobalamin (VITAMIN B-12 PO) Take 1 tablet by mouth daily.   Yes [provider]  glipiZIDE (GLUCOTROL) 10 MG tablet Take 10 mg by mouth 2 (two) times daily before a meal.   Yes [provider]  losartan (COZAAR) 25 MG tablet Take 0.5 tablets (12.5 mg total) by mouth daily. 06/22/18 07/22/18 Yes Mikhail, [place], DO  Misc Natural Products (TURMERIC CURCUMIN) CAPS Take 1 capsule by mouth daily.    Yes [provider]  omeprazole (PRILOSEC) 20 MG capsule Take 20 mg by mouth daily.   Yes [provider]  senna-docusate (SENOKOT-S) 8.6-50 MG tablet Take 2 tablets by mouth daily.   Yes [provider]     Family History  Problem Relation Age of Onset  . Other Grandchild     Social History   Socioeconomic History  . Marital status: Widowed    Spouse name: Not on file  . Number of children: Not on file  . Years of education: Not on file  . Highest education level: Not on file  Occupational History  . Not on file  Social Needs  . Financial resource strain: Not on file  . Food insecurity:    Worry: Not on file    Inability: Not on file  . Transportation needs:    Medical: Not on file    Non-medical: Not on file  Tobacco Use  . Smoking status: Never Smoker  . Smokeless tobacco: Never Used  Substance and Sexual Activity  . Alcohol use: No  . Drug use: No  . Sexual activity: Never  Lifestyle  . Physical activity:    Days per week: Not on file    Minutes per session: Not on file  . Stress: Not on file    Relationships  . Social connections:    Talks on phone: Not on file    Gets together: Not on file    Attends religious service: Not on file    Active member of club or organization: Not on file    Attends meetings of clubs or organizations: Not on file    Relationship status: Not on file  Other Topics Concern  . Not on file  Social History Narrative  . Not on file    Review of Systems: A 12 point ROS discussed and pertinent positives are indicated in the HPI above.  All other systems are negative.  Review of Systems  Constitutional: Positive for activity change, appetite change, fatigue and fever.  Respiratory: Positive for shortness of breath.   Gastrointestinal: Positive for abdominal pain, nausea and vomiting.  Neurological: Positive for weakness.  Psychiatric/Behavioral: Positive for decreased concentration.    Vital Signs: BP (!) 161/68 (BP Location: Right Arm)   Pulse 79   Temp 98.9 F (37.2 C) (Oral)   Resp 18   Ht 6\' 2"  (1.88 m)   Wt (!) 325 lb (147.4 kg)   SpO2 99%   BMI 41.73 kg/m   Physical Exam Vitals signs reviewed.  Cardiovascular:     Rate and Rhythm: Normal rate.  Pulmonary:     Effort: Pulmonary effort is normal.  Abdominal:     General: Bowel sounds are normal. There is distension.     Tenderness: There is abdominal tenderness.  Skin:    General: Skin is warm.  Psychiatric:     Comments: Consented with Grand daughter at bedside POA     Imaging: Dg Chest 2 View  Result Date: 12020/11/2017 CLINICAL DATA:  Fever and vomiting. Possible infection. EXAM: CHEST - 2 VIEW COMPARISON:  06/14/2018. FINDINGS: Cardiomegaly. Low lung volumes. No consolidation or edema. No effusion or pneumothorax. Calcified tortuous aorta. IMPRESSION: Cardiomegaly. No active disease. Electronically Signed   By: Elsie Stain M.D.   On: 12020/11/2017 07:04   Ct Head Wo Contrast  Result Date: 12020/11/2017 CLINICAL DATA:  Fever and vomiting. EXAM: CT HEAD WITHOUT CONTRAST  TECHNIQUE: Contiguous axial images were obtained from the base of the skull through the vertex without intravenous contrast. COMPARISON:  June 14, 2018 FINDINGS: Brain: No subdural, epidural, or subarachnoid hemorrhage. Cerebellum, brainstem, and basal cisterns are normal. Ventricles and sulci are unchanged. White matter changes are stable. No acute cortical ischemia or infarct. No mass effect or midline shift. Vascular: No hyperdense vessel or unexpected calcification. Skull: Normal. Negative for fracture or focal lesion. Sinuses/Orbits: No acute finding. Other: None. IMPRESSION: 1. No acute intracranial abnormalities. No cause for fever or vomiting noted. Electronically Signed   By: Gerome Sam III M.D   On: 12020/11/2017 13:36   Nm Hepatobiliary Liver Func  Result Date: 07/16/2018 CLINICAL DATA:  Abnormal CT scan.  Abdominal pain. EXAM: NUCLEAR MEDICINE HEPATOBILIARY IMAGING TECHNIQUE: Sequential images of the abdomen were obtained out to 60 minutes following intravenous administration of radiopharmaceutical. RADIOPHARMACEUTICALS:  5.3 mCi Tc-89110m  Choletec IV COMPARISON:  None. FINDINGS: Prompt uptake and biliary excretion of activity by the liver is seen. The gallbladder does not fill during 2 hours of imaging. Biliary activity passes into small bowel, consistent with patent common bile duct. IMPRESSION: The gallbladder does not fill during 2 hours of imaging. Given the CT findings, findings are most consistent with acute cholecystitis. Lack of gallbladder filling can also be seen in the setting of chronic cholecystitis, recent meal, prolonged fasting, or severe acute illness. Electronically Signed   By: Gerome Samavid  Williams III M.D   On: 07/16/2018 12:02   Ct Abdomen Pelvis W Contrast  Result Date: 12020/10/2317 CLINICAL DATA:  Acute generalized abdominal pain.  Vomiting. EXAM: CT ABDOMEN AND PELVIS WITH CONTRAST TECHNIQUE: Multidetector CT imaging of the abdomen and pelvis was performed using the  standard protocol following bolus administration of intravenous contrast. CONTRAST:  100mL OMNIPAQUE IOHEXOL 300 MG/ML  SOLN COMPARISON:  None. FINDINGS: Lower chest: Minimal bibasilar posterior subsegmental atelectasis is noted. Hepatobiliary: No biliary dilatation is noted. The liver is unremarkable. Multiple gallstones are noted with the largest measuring 4 cm in the neck of the gallbladder. Mild gallbladder dilatation is noted with mild surrounding fluid and inflammatory changes concerning for acute cholecystitis. Pancreas: Unremarkable. No pancreatic ductal dilatation or surrounding inflammatory changes. Spleen: Normal in size without focal abnormality. Adrenals/Urinary Tract: Adrenal glands are unremarkable. Kidneys are normal, without renal calculi, focal lesion, or hydronephrosis. Bladder is unremarkable. Stomach/Bowel: Stomach is within normal limits. Appendix appears normal. No evidence of bowel wall thickening, distention, or inflammatory changes. Vascular/Lymphatic: Aortic atherosclerosis. No enlarged abdominal or pelvic lymph nodes. Reproductive: Prostate is unremarkable. Other: Moderate fat containing right inguinal hernia is noted. No ascites is noted. Musculoskeletal: No acute or significant osseous findings. IMPRESSION: Cholelithiasis is noted with 4 cm gallstone noted in neck of gallbladder. Mild gallbladder dilatation is noted with mild surrounding fluid and inflammatory changes concerning for acute cholecystitis. Moderate size fat containing right inguinal hernia. Aortic Atherosclerosis (ICD10-I70.0). Electronically Signed   By: Lupita RaiderJames  Green Jr, M.D.   On: 12020/10/2317 13:39    Labs:  CBC: Recent Labs    06/14/18 1648 06/14/18 1717 07/15/18 0528 07/16/18 0246 07/17/18 0145  WBC 9.3  --  16.6* 23.9* 22.0*  HGB 12.4* 13.6 12.7* 12.2* 11.6*  HCT 39.7 40.0 41.9 38.6* 38.5*  PLT 200  --  184 174 176    COAGS: Recent Labs    03/23/18 0317 06/14/18 1648  INR 1.11 1.00  APTT 38*  38*    BMP: Recent Labs    06/14/18 1648 06/14/18 1717 07/15/18 0528 07/16/18 0246 07/17/18 0145  NA 136 138 135 137 138  K 4.0 4.0 3.8 3.6 3.5  CL 103 102 97* 100 104  CO2 24  --  23 23 26   GLUCOSE 291* 286* 352* 328* 283*  BUN 14 15 10 9 8   CALCIUM 9.6  --  9.7 9.1 9.0  CREATININE 1.05 1.10 1.10 1.11 0.96  GFRNONAA >60  --  59* 58* >60  GFRAA >60  --  >60 >60 >60    LIVER FUNCTION TESTS: Recent Labs    03/23/18 0317 06/14/18 1648 07/15/18 0528 07/17/18 0145  BILITOT 0.7 0.5 0.9 0.7  AST 18 15 15 15   ALT  12 13 10 12   ALKPHOS 57 59 63 70  PROT 6.6 7.1 7.5 6.8  ALBUMIN 3.5 3.4* 3.5 2.7*    TUMOR MARKERS: No results for input(s): AFPTM, CEA, CA199, CHROMGRNA in the last 8760 hours.  Assessment and Plan:  abd pain N/V Loss of appetite Leukocytosis; fever +HIDA Scheduled now for percutaneous cholecystostomy drain placement Risks and benefits discussed with the patient including, but not limited to bleeding, infection, gallbladder perforation, bile leak, sepsis or even death. LD lovenox 2 days ago  All of the patient's questions were answered, patient is agreeable to proceed. Consent signed and in chart.   Thank you for this interesting consult.  I greatly enjoyed meeting Tom Wang and look forward to participating in their care.  A copy of this report was sent to the requesting provider on this date.  Electronically Signed: Robet Leu, PA-C 07/17/2018, 1:55 PM   I spent a total of 40 Minutes    in face to face in clinical consultation, greater than 50% of which was counseling/coordinating care for perc chole drain

## 2018-07-18 ENCOUNTER — Inpatient Hospital Stay (HOSPITAL_COMMUNITY): Payer: Medicare HMO

## 2018-07-18 DIAGNOSIS — G9341 Metabolic encephalopathy: Secondary | ICD-10-CM

## 2018-07-18 DIAGNOSIS — K8001 Calculus of gallbladder with acute cholecystitis with obstruction: Secondary | ICD-10-CM

## 2018-07-18 LAB — CBC
HEMATOCRIT: 35.8 % — AB (ref 39.0–52.0)
Hemoglobin: 10.9 g/dL — ABNORMAL LOW (ref 13.0–17.0)
MCH: 24.4 pg — ABNORMAL LOW (ref 26.0–34.0)
MCHC: 30.4 g/dL (ref 30.0–36.0)
MCV: 80.3 fL (ref 80.0–100.0)
Platelets: 191 10*3/uL (ref 150–400)
RBC: 4.46 MIL/uL (ref 4.22–5.81)
RDW: 15.3 % (ref 11.5–15.5)
WBC: 14.1 10*3/uL — ABNORMAL HIGH (ref 4.0–10.5)
nRBC: 0 % (ref 0.0–0.2)

## 2018-07-18 LAB — GLUCOSE, CAPILLARY
Glucose-Capillary: 279 mg/dL — ABNORMAL HIGH (ref 70–99)
Glucose-Capillary: 245 mg/dL — ABNORMAL HIGH (ref 70–99)
Glucose-Capillary: 279 mg/dL — ABNORMAL HIGH (ref 70–99)
Glucose-Capillary: 269 mg/dL — ABNORMAL HIGH (ref 70–99)

## 2018-07-18 LAB — BLOOD GAS, ARTERIAL
Acid-Base Excess: 0.5 mmol/L (ref 0.0–2.0)
Bicarbonate: 24.5 mmol/L (ref 20.0–28.0)
Drawn by: 244901
FIO2: 21
O2 Saturation: 92.3 %
PH ART: 7.406 (ref 7.350–7.450)
PO2 ART: 64.3 mmHg — AB (ref 83.0–108.0)
Patient temperature: 99.5
pCO2 arterial: 40.2 mmHg (ref 32.0–48.0)

## 2018-07-18 LAB — BASIC METABOLIC PANEL
Anion gap: 10 (ref 5–15)
BUN: 10 mg/dL (ref 8–23)
CO2: 24 mmol/L (ref 22–32)
Calcium: 8.6 mg/dL — ABNORMAL LOW (ref 8.9–10.3)
Chloride: 106 mmol/L (ref 98–111)
Creatinine, Ser: 1.1 mg/dL (ref 0.61–1.24)
GFR calc Af Amer: >60 mL/min (ref >60)
GFR calc non Af Amer: 59 mL/min — ABNORMAL LOW (ref >60)
Glucose, Bld: 307 mg/dL — ABNORMAL HIGH (ref 70–99)
Potassium: 3.6 mmol/L (ref 3.5–5.1)
Sodium: 140 mmol/L (ref 135–145)

## 2018-07-18 MED ORDER — ASPIRIN 300 MG RE SUPP
300.0000 mg | Freq: Every day | RECTAL | Status: DC
  Filled 2018-07-18: qty 1

## 2018-07-18 MED ORDER — ASPIRIN 325 MG PO TABS
325.0000 mg | ORAL_TABLET | Freq: Every day | ORAL | Status: DC
  Filled 2018-07-18 (×2): qty 1

## 2018-07-18 MED ORDER — MILK AND MOLASSES ENEMA
1.0000 | Freq: Once | RECTAL | Status: DC
  Filled 2018-07-18 (×2): qty 250

## 2018-07-18 NOTE — Progress Notes (Signed)
Physical Therapy Treatment Patient Details Name: Tom Wang MRN: [id number] DOB: 1925-01-11 Today's Date: 07/18/2018    History of Present Illness Tom Wang is a 84 y.o. male with medical history significant for diabetes mellitus recent stroke hypertension GERD, who was brought by ambulance from home because of sudden onset of nausea and vomiting around lunchtime the day prior to admission, and family reported patient had been less active than usual seem more confused and tired and sleeping a lot.Gall stone in bile duct with infection of gallbladder. General surgery deemed Not optimal operative candidate. Now s/p percutaneous cholecystostomy on 12/16    PT Comments    Continuing work on functional mobility and activity tolerance;  Session focused on helping Tom Wang to recliner; initially, I had planned to use the [employer] Plus lift to approximate the equipment he is used to at home, however, the position of the perc cholecystostomy drain would be very uncomfortable under the Sara straps; Opted for the [facility], and positioned him in the recliner with Daughter and Grandddaughter present; Will consider a way to pad the perc drain for their use of the standing lift at home (perhaps simply egg crate foam with "bumpy" side toward patient) -- worth considering hoyer lift for home as well.  Mr. Tom Wang' family is dedicated to his care; I don't think they are ready for this conversation, but I do believe he is appropriate for a Palliative Care Team consult.    Follow Up Recommendations  Home health PT;Supervision/Assistance - 24 hour;Other (comment)(Consider HHOT and RN as well)     Equipment Recommendations  Other (comment)(Hoyer lift, Ambulance transport home)    Recommendations for Other Services       Precautions / Restrictions Precautions Precautions: Fall    Mobility  Bed Mobility Overal bed mobility: Needs Assistance Bed Mobility: Rolling;Sidelying to Sit Rolling: +2  for physical assistance;Total assist Sidelying to sit: +2 for physical assistance;Total assist       General bed mobility comments: Total assist for all aspects of bed mobility  Transfers Overall transfer level: Needs assistance               General transfer comment: Total assist of 3 using Maximove for bed to chair transfer; Placed lift pad at his back while he was sitting EOB; total assist to steady in sitting while pad was placed  Ambulation/Gait                 Stairs             Wheelchair Mobility    Modified Rankin (Stroke Patients Only)       Balance     Sitting balance-Leahy Scale: Zero Sitting balance - Comments: Sat EOB approx 5 minutes; less aroused and noted less trunk postural muscle recruitment this session sitting EOB, but still notable pusher-like R lean Postural control: Right lateral lean                                  Cognition Arousal/Alertness: Lethargic Behavior During Therapy: Flat affect Overall Cognitive Status: Impaired/Different from baseline                                 General Comments: Eyes closed majority of session; he did open eyes briefly about 3-5 times, and answered one yes/no question      Exercises  General Comments General comments (skin integrity, edema, etc.): O2 sats 92% at the end of the session conducted on Room Air      Pertinent Vitals/Pain Pain Assessment: Faces Pain Location: Vocalizes with PROM bil LEs; Family tells me that's his normal Pain Descriptors / Indicators: Moaning Pain Intervention(s): Monitored during session    Home Living                      Prior Function            PT Goals (current goals can now be found in the care plan section) Acute Rehab PT Goals Patient Stated Goal: Did not state; Family very much wants to bring him home PT Goal Formulation: With family Time For Goal Achievement: 07/30/18 Potential to Achieve Goals:  Fair Progress towards PT goals: Not progressing toward goals - comment(Less arousable this session)    Frequency    Min 3X/week      PT Plan Current plan remains appropriate;Equipment recommendations need to be updated    Co-evaluation              AM-PAC PT "6 Clicks" Mobility   Outcome Measure  Help needed turning from your back to your side while in a flat bed without using bedrails?: Total Help needed moving from lying on your back to sitting on the side of a flat bed without using bedrails?: Total Help needed moving to and from a bed to a chair (including a wheelchair)?: Total Help needed standing up from a chair using your arms (e.g., wheelchair or bedside chair)?: Total Help needed to walk in hospital room?: Total Help needed climbing 3-5 steps with a railing? : Total 6 Click Score: 6    End of Session Equipment Utilized During Treatment: Other (comment)(Maximove) Activity Tolerance: Patient tolerated treatment well Patient left: in chair;with call bell/phone within reach;with family/visitor present Nurse Communication: Mobility status;Need for lift equipment PT Visit Diagnosis: Unsteadiness on feet (R26.81);Other abnormalities of gait and mobility (R26.89);Muscle weakness (generalized) (M62.81);Other symptoms and signs involving the nervous system (R29.898);Hemiplegia and hemiparesis Hemiplegia - Right/Left: Right Hemiplegia - dominant/non-dominant: Dominant Hemiplegia - caused by: Cerebral infarction     Time: 1334-1410 PT Time Calculation (min) (ACUTE ONLY): 36 min  Charges:  $Therapeutic Activity: 23-37 mins                     Van ClinesHolly Victorian Gunn, PT  Acute Rehabilitation Services Pager [phone] Office [phone]    Levi AlandHolly H Paytyn Mesta 07/18/2018, 4:17 PM

## 2018-07-18 NOTE — Progress Notes (Addendum)
PROGRESS NOTE  Tom Wang MWU:[id number] DOB: 26-Oct-1924 DOA: 07/15/2018 PCP: [name]   LOS: 3 days   Brief Narrative / Interim history: 84 year old male with history of diabetes mellitus, recent CVA few weeks ago, hypertension, who was brought to the hospital on 12/14 due to sudden onset of nausea and vomiting around lunchtime.  He was also reported to be a little bit more confused and lethargic and sleeping a lot.  He has been having abdominal pain since yesterday.  Family also reported fevers and chills at home  Subjective: -Rapid response called this morning due to patient not waking up.  Evaluated stat at bedside.  His vital signs are stable, CBG in the 200s, patient appears comfortable however does not respond to verbal stimuli.  He responds to pain.  Daughter is present and tells me that he "does this all the time at home" when he is extremely tired.  Patient seems to respond more to the daughter and open her eyes when she asks him to.  Will obtain a stat CT scan of the head, ABG, blood work this morning looks fairly unremarkable.  Assessment & Plan: Principal Problem:   Gall stone in bile duct with infection of gallbladder Active Problems:   Essential hypertension   Hyperlipidemia   DM type 2 (diabetes mellitus, type 2) (HCC)   H/O: CVA (cerebrovascular accident)   Chronic anticoagulation- for h/o LLE DVT, on Eliquis.   Somnolence   Nephrolithiasis   Principal Problem Acute metabolic encephalopathy/unresponsiveness -Patient's family tells me that he this is normal for him and does this occasionally at home.  ABG reviewed and unremarkable.  Blood work this morning with white count improving.  CT scan of the brain without acute findings.  We will continue to closely monitor  Addendum: CT scan unremarkable. MRI ordered and shows scattered small acute infarcts in the left PCA territory with no associated hemorrhage or mass effect. Neurology consulted, d/w Dr  Aroor  Additional Problems Sepsis in the setting of acute cholecystitis -Still febrile, had significant white count yesterday which is now improving -LFTs were normal on admission, he underwent a CT scan of the abdomen and pelvis which showed cholelithiasis with 4 cm gallstone noted in the neck of the gallbladder -General surgery was consulted and patient was started on broad-spectrum antibiotics -HIDA scan was positive for cholecystitis, however not a good surgical candidate, status post cholecystostomy by IR on 12/16 -Continue broad-spectrum antibiotics and monitor cultures, currently with rare gram variable rods and gram-negative rods  Type 2 diabetes mellitus -Patient has been refusing insulin in the past, states that he is allergic to it, this is confirmed by daughter who is at bedside who tells me that every time he gets insulin he has strokelike symptoms -Hemoglobin A1c in the 9 range, showing poor control -Continue glipizide, CBGs in the hospital remained on the high side however family refuses insulin  History of CVA -Admitted and discharged about a month ago when he presented with slurred speech, MRI at that time showed an acute 14 mm left thalamus and 4 mm left pons nonhemorrhagic infarcts. -Resumed aspirin PR as he has inconsistent p.o. intake  Hypertension -Blood pressure significantly elevated yesterday afternoon, patient was started on clonidine patch, this was discontinued this morning given blood pressure in the normal range and patient with decreased alertness as above, if his blood pressure at home is chronically elevated the "normal" range may actually be low for him  Hyperlipidemia -Continue statin   Scheduled Meds: .  acetaminophen  500 mg Oral Daily  . amLODipine  10 mg Oral Daily  . aspirin  300 mg Rectal Daily  . atorvastatin  40 mg Oral q1800  . cholecalciferol  500 Units Oral QHS  . cloNIDine  0.1 mg Oral Once  . enoxaparin (LOVENOX) injection  70 mg  Subcutaneous Q24H  . glipiZIDE  10 mg Oral BID AC  . lidocaine  1 patch Transdermal Q24H  . pantoprazole  40 mg Oral Daily  . senna-docusate  2 tablet Oral Daily  . sodium chloride flush  5 mL Intracatheter Q8H  . vitamin B-12  50 mcg Oral Daily   Continuous Infusions: . ertapenem 200 mL/hr at 07/18/18 0600   PRN Meds:.  DVT prophylaxis: lovenox  Code Status: Full code Family Communication: Daughter present at bedside Disposition Plan: To be determined  Consultants:   General surgery  Procedures:   None   Antimicrobials:  Ertapenem 12/14 >>  Objective: Vitals:   07/17/18 2340 07/18/18 0516 07/18/18 0823 07/18/18 0920  BP: (!) 157/68 138/70 (!) 152/56   Pulse: (!) 119 82 69   Resp:  16    Temp:  (!) 100.9 F (38.3 C) 99.3 F (37.4 C) 100 F (37.8 C)  TempSrc:  Axillary Oral Rectal  SpO2:  98% 94%   Weight:      Height:        Intake/Output Summary (Last 24 hours) at 07/18/2018 1005 Last data filed at 07/18/2018 0600 Gross per 24 hour  Intake 2925.94 ml  Output 550 ml  Net 2375.94 ml   Filed Weights   07/15/18 0454  Weight: (!) 147.4 kg    Examination:  Constitutional: Not responding to me but opens eyes when daughter calls him ENMT: Moist mucous membranes Neck: Normal, supple Respiratory: Clear to auscultation bilaterally in anterior fields, no wheezing or crackles, normal respiratory effort Cardiovascular: Regular rate and rhythm, no murmurs.  Trace edema Abdomen: Soft, nontender, nondistended, bowel sounds positive.  Cholecystostomy drain in place Musculoskeletal: No clubbing or cyanosis Skin: No rashes seen Neurologic: Responds to pain in all 4 extremities, seems nonfocal, facial droop still present this is chronic Psychiatric: Unable to assess today   Data Reviewed: I have independently reviewed following labs and imaging studies   CBC: Recent Labs  Lab 07/15/18 0528 07/16/18 0246 07/17/18 0145 07/18/18 0830  WBC 16.6* 23.9* 22.0*  14.1*  NEUTROABS 12.3*  --   --   --   HGB 12.7* 12.2* 11.6* 10.9*  HCT 41.9 38.6* 38.5* 35.8*  MCV 81.7 80.4 81.9 80.3  PLT 184 174 176 191   Basic Metabolic Panel: Recent Labs  Lab 07/15/18 0528 07/16/18 0246 07/17/18 0145 07/18/18 0830  NA 135 137 138 140  K 3.8 3.6 3.5 3.6  CL 97* 100 104 106  CO2 23 23 26 24   GLUCOSE 352* 328* 283* 307*  BUN 10 9 8 10   CREATININE 1.10 1.11 0.96 1.10  CALCIUM 9.7 9.1 9.0 8.6*   GFR: Estimated Creatinine Clearance: 68.4 mL/min (by C-G formula based on SCr of 1.1 mg/dL). Liver Function Tests: Recent Labs  Lab 07/15/18 0528 07/17/18 0145  AST 15 15  ALT 10 12  ALKPHOS 63 70  BILITOT 0.9 0.7  PROT 7.5 6.8  ALBUMIN 3.5 2.7*   No results for input(s): LIPASE, AMYLASE in the last 168 hours. No results for input(s): AMMONIA in the last 168 hours. Coagulation Profile: Recent Labs  Lab 07/17/18 1415  INR 1.06   Cardiac Enzymes:  No results for input(s): CKTOTAL, CKMB, CKMBINDEX, TROPONINI in the last 168 hours. BNP (last 3 results) No results for input(s): PROBNP in the last 8760 hours. HbA1C: Recent Labs    07/16/18 0246  HGBA1C 9.5*   CBG: Recent Labs  Lab 07/17/18 0812 07/17/18 1237 07/17/18 1723 07/17/18 2118 07/18/18 0822  GLUCAP 230* 239* 257* 266* 279*   Lipid Profile: No results for input(s): CHOL, HDL, LDLCALC, TRIG, CHOLHDL, LDLDIRECT in the last 72 hours. Thyroid Function Tests: No results for input(s): TSH, T4TOTAL, FREET4, T3FREE, THYROIDAB in the last 72 hours. Anemia Panel: No results for input(s): VITAMINB12, FOLATE, FERRITIN, TIBC, IRON, RETICCTPCT in the last 72 hours. Urine analysis:    Component Value Date/Time   COLORURINE YELLOW (A) 102-02-202019 1033   APPEARANCEUR CLOUDY (A) 102-02-202019 1033   LABSPEC 1.025 102-02-202019 1033   PHURINE 6.0 102-02-202019 1033   GLUCOSEU 500 (A) 102-02-202019 1033   HGBUR SMALL (A) 102-02-202019 1033   BILIRUBINUR NEGATIVE 102-02-202019 1033   KETONESUR NEGATIVE 102-02-202019  1033   PROTEINUR >300 (A) 102-02-202019 1033   UROBILINOGEN 0.2 12/13/2014 2011   NITRITE NEGATIVE 102-02-202019 1033   LEUKOCYTESUR NEGATIVE 102-02-202019 1033   Sepsis Labs: Invalid input(s): PROCALCITONIN, LACTICIDVEN  Recent Results (from the past 240 hour(s))  Blood culture (routine x 2)     Status: None (Preliminary result)   Collection Time: 07/15/18  5:30 AM  Result Value Ref Range Status   Specimen Description BLOOD LEFT ARM  Final   Special Requests   Final    BOTTLES DRAWN AEROBIC ONLY Blood Culture adequate volume   Culture   Final    NO GROWTH 3 DAYS Performed at [facility], [address] [place], [place] [address]    Report Status PENDING  Incomplete  Blood culture (routine x 2)     Status: None (Preliminary result)   Collection Time: 07/15/18  5:45 AM  Result Value Ref Range Status   Specimen Description BLOOD LEFT HAND  Final   Special Requests   Final    BOTTLES DRAWN AEROBIC AND ANAEROBIC Blood Culture adequate volume   Culture   Final    NO GROWTH 3 DAYS Performed at [facility], [address] [place], [place] [address]    Report Status PENDING  Incomplete  Urine culture     Status: None   Collection Time: 07/15/18 10:33 AM  Result Value Ref Range Status   Specimen Description URINE, RANDOM  Final   Special Requests NONE  Final   Culture   Final    NO GROWTH Performed at [facility], [address] [place], [place] [address]    Report Status 07/16/2018 FINAL  Final  Surgical PCR screen     Status: None   Collection Time: 07/16/18  4:23 AM  Result Value Ref Range Status   MRSA, PCR NEGATIVE NEGATIVE Final   Staphylococcus aureus NEGATIVE NEGATIVE Final    Comment: (NOTE) The Xpert SA Assay (FDA approved for NASAL specimens in patients 10122 years of age and older), is one component of a comprehensive surveillance program. It is not intended to diagnose infection nor to guide or monitor treatment. Performed at [facility], [address] [place], [place] [address]   Aerobic/Anaerobic Culture (surgical/deep wound)     Status: None (Preliminary result)   Collection Time: 07/17/18  5:01 PM  Result Value Ref Range Status   Specimen Description GALL BLADDER BILE  Final  Special Requests NONE  Final   Gram Stain   Final    ABUNDANT WBC PRESENT, PREDOMINANTLY PMN MODERATE GRAM NEGATIVE RODS RARE GRAM VARIABLE ROD    Culture   Final    CULTURE REINCUBATED FOR BETTER GROWTH Performed at [facility], [address], [place], [place] [address]    Report Status PENDING  Incomplete      Radiology Studies: Ct Head Wo Contrast  Result Date: 07/18/2018 CLINICAL DATA:  84 year old who is unresponsive to stimuli, an acute change in mental status. EXAM: CT HEAD WITHOUT CONTRAST TECHNIQUE: Contiguous axial images were obtained from the base of the skull through the vertex without intravenous contrast. COMPARISON:  CT head 111/07/202019 and earlier. MRI brain 06/15/2018 and earlier. FINDINGS: Brain: Moderate to severe age related cortical atrophy, mild deep atrophy and mild cerebellar atrophy, unchanged. Severe changes of small vessel disease of the white matter diffusely including the pons and brainstem, unchanged. No mass lesion. No midline shift. No acute hemorrhage or hematoma. No extra-axial fluid collections. No evidence of acute infarction. Vascular: Moderate BILATERAL carotid siphon and vertebral artery atherosclerosis. No hyperdense vessel. Skull: No skull fracture or other focal osseous abnormality involving the skull. Sinuses/Orbits: Small mucous retention cyst or polyp involving the LEFT maxillary sinus. Minimal mucosal thickening involving the base of the frontal sinuses. Visualized paranasal sinuses otherwise well aerated. BILATERAL mastoid air cells and BILATERAL middle ear cavities well-aerated. Asymmetric hyperdense lens in the LEFT eye as noted previously. Orbits and globes otherwise unremarkable. Other:  None. IMPRESSION: 1. No acute intracranial abnormality. 2. Stable moderate to severe age related generalized atrophy and severe chronic microvascular ischemic changes of the white matter. Electronically Signed   By: Hulan Saas M.D.   On: 07/18/2018 10:02   Nm Hepatobiliary Liver Func  Result Date: 07/16/2018 CLINICAL DATA:  Abnormal CT scan.  Abdominal pain. EXAM: NUCLEAR MEDICINE HEPATOBILIARY IMAGING TECHNIQUE: Sequential images of the abdomen were obtained out to 60 minutes following intravenous administration of radiopharmaceutical. RADIOPHARMACEUTICALS:  5.3 mCi Tc-53m  Choletec IV COMPARISON:  None. FINDINGS: Prompt uptake and biliary excretion of activity by the liver is seen. The gallbladder does not fill during 2 hours of imaging. Biliary activity passes into small bowel, consistent with patent common bile duct. IMPRESSION: The gallbladder does not fill during 2 hours of imaging. Given the CT findings, findings are most consistent with acute cholecystitis. Lack of gallbladder filling can also be seen in the setting of chronic cholecystitis, recent meal, prolonged fasting, or severe acute illness. Electronically Signed   By: Gerome Sam III M.D   On: 07/16/2018 12:02   Ir Perc Cholecystostomy  Result Date: 07/17/2018 INDICATION: Acute cholecystitis EXAM: CHOLECYSTOSTOMY MEDICATIONS: Vancomycin 1 g ANESTHESIA/SEDATION: Fentanyl 75 mcg IV; Versed 1 mg IV Moderate Sedation Time:  13 minutes The patient was continuously monitored during the procedure by the interventional radiology nurse under my direct supervision. FLUOROSCOPY TIME:  Fluoroscopy Time: 1 minutes 18 seconds (10 mGy). COMPLICATIONS: None immediate. PROCEDURE: Informed written consent was obtained from the patient after a thorough discussion of the procedural risks, benefits and alternatives. All questions were addressed. Maximal Sterile Barrier Technique was utilized including caps, mask, sterile gowns, sterile gloves, sterile  drape, hand hygiene and skin antiseptic. A timeout was performed prior to the initiation of the procedure. The right upper quadrant was prepped with ChloraPrep in a sterile fashion, and a sterile drape was applied covering the operative field. A sterile gown and sterile gloves were used for the  procedure. A 21 gauge needle was inserted into the gallbladder lumen under sonographic guidance and via transhepatic approach. It was removed over a 018 wire, which was upsized to a 3-J. A 10-French drain was advanced over the wire and coiled in the gallbladder lumen. It was sewn to the skin after being string fixed. FINDINGS: Imaging demonstrates placement of a 10 French drain into the lumen of the gallbladder. Contrast fills the gallbladder. A gallstone is noted. The cystic duct is occluded. IMPRESSION: Successful cholecystostomy. This needs to remain in place at least 6 weeks. Electronically Signed   By: Jolaine Click M.D.   On: 07/17/2018 16:30    Pamella Pert, MD, PhD [facility] Pager [phone]  If 7PM-7AM, please contact night-coverage [email or web address] Password [email or web address] 07/18/2018, 10:05 AM

## 2018-07-18 NOTE — Progress Notes (Signed)
Referring Physician(s): Dr Warren Lacy  Supervising Physician: Richarda Overlie  Patient Status:  [facility] - In-pt  Chief Complaint:  Acute cholecystitis S/P Perc chole yesterday by Dr. Bonnielee Haff  Subjective:  Patient very somnolent this am. Rapid response nurse in room along with family member trying to arouse him.  Allergies: Morphine and related; Other; Gabapentin; Insulins; Lantus [insulin glargine]; and Penicillins  Medications: Prior to Admission medications   Medication Sig Start Date End Date Taking? Authorizing Provider  acetaminophen (TYLENOL) 500 MG tablet Take 500 mg by mouth daily.    Yes [provider]  Ascorbic Acid (VITAMIN C PO) Take 1 tablet by mouth daily.   Yes [provider]  aspirin 325 MG tablet Take 1 tablet (325 mg total) by mouth daily. 06/17/18  Yes Mikhail, Nita Sells, DO  atorvastatin (LIPITOR) 40 MG tablet Take 1 tablet (40 mg total) by mouth daily at 6 PM. 06/17/18  Yes Mikhail, [place], DO  cholecalciferol (VITAMIN D) 1000 UNITS tablet Take 500 Units by mouth at bedtime.    Yes [provider]  Cyanocobalamin (VITAMIN B-12 PO) Take 1 tablet by mouth daily.   Yes [provider]  glipiZIDE (GLUCOTROL) 10 MG tablet Take 10 mg by mouth 2 (two) times daily before a meal.   Yes [provider]  losartan (COZAAR) 25 MG tablet Take 0.5 tablets (12.5 mg total) by mouth daily. 06/22/18 07/22/18 Yes Mikhail, [place], DO  Misc Natural Products (TURMERIC CURCUMIN) CAPS Take 1 capsule by mouth daily.    Yes [provider]  omeprazole (PRILOSEC) 20 MG capsule Take 20 mg by mouth daily.   Yes [provider]  senna-docusate (SENOKOT-S) 8.6-50 MG tablet Take 2 tablets by mouth daily.   Yes [provider]     Vital Signs: BP (!) 152/56 (BP Location: Left Arm)   Pulse 69   Temp 100 F (37.8 C) (Rectal)   Resp 16   Ht 6\' 2"  (1.88 m)   Wt (!) 147.4 kg   SpO2 94%   BMI 41.73 kg/m   Physical  Exam Sleeping, unarousable, currently being evaluated by other providers. Drain appears to be in good position but there is only clear/red tinged drainage in the bag. I flushed and aspirated and I did get bilious return which confirms proper placement. ~150 mL output recorded  Imaging: Dg Chest 2 View  Result Date: 07/15/2018 CLINICAL DATA:  Fever and vomiting. Possible infection. EXAM: CHEST - 2 VIEW COMPARISON:  06/14/2018. FINDINGS: Cardiomegaly. Low lung volumes. No consolidation or edema. No effusion or pneumothorax. Calcified tortuous aorta. IMPRESSION: Cardiomegaly. No active disease. Electronically Signed   By: Elsie Stain M.D.   On: 07/15/2018 07:04   Ct Head Wo Contrast  Result Date: 07/18/2018 CLINICAL DATA:  84 year old who is unresponsive to stimuli, an acute change in mental status. EXAM: CT HEAD WITHOUT CONTRAST TECHNIQUE: Contiguous axial images were obtained from the base of the skull through the vertex without intravenous contrast. COMPARISON:  CT head 07/15/2018 and earlier. MRI brain 06/15/2018 and earlier. FINDINGS: Brain: Moderate to severe age related cortical atrophy, mild deep atrophy and mild cerebellar atrophy, unchanged. Severe changes of small vessel disease of the white matter diffusely including the pons and brainstem, unchanged. No mass lesion. No midline shift. No acute hemorrhage or hematoma. No extra-axial fluid collections. No evidence of acute infarction. Vascular: Moderate BILATERAL carotid siphon and vertebral artery atherosclerosis. No hyperdense vessel. Skull: No skull fracture or other focal osseous abnormality involving the  skull. Sinuses/Orbits: Small mucous retention cyst or polyp involving the LEFT maxillary sinus. Minimal mucosal thickening involving the base of the frontal sinuses. Visualized paranasal sinuses otherwise well aerated. BILATERAL mastoid air cells and BILATERAL middle ear cavities well-aerated. Asymmetric hyperdense lens in the LEFT eye  as noted previously. Orbits and globes otherwise unremarkable. Other: None. IMPRESSION: 1. No acute intracranial abnormality. 2. Stable moderate to severe age related generalized atrophy and severe chronic microvascular ischemic changes of the white matter. Electronically Signed   By: Hulan Saashomas  Trammell M.D.   On: 07/18/2018 10:02   Ct Head Wo Contrast  Result Date: 07/15/2018 CLINICAL DATA:  Fever and vomiting. EXAM: CT HEAD WITHOUT CONTRAST TECHNIQUE: Contiguous axial images were obtained from the base of the skull through the vertex without intravenous contrast. COMPARISON:  June 14, 2018 FINDINGS: Brain: No subdural, epidural, or subarachnoid hemorrhage. Cerebellum, brainstem, and basal cisterns are normal. Ventricles and sulci are unchanged. White matter changes are stable. No acute cortical ischemia or infarct. No mass effect or midline shift. Vascular: No hyperdense vessel or unexpected calcification. Skull: Normal. Negative for fracture or focal lesion. Sinuses/Orbits: No acute finding. Other: None. IMPRESSION: 1. No acute intracranial abnormalities. No cause for fever or vomiting noted. Electronically Signed   By: Gerome Samavid  Williams III M.D   On: 07/15/2018 13:36   Nm Hepatobiliary Liver Func  Result Date: 07/16/2018 CLINICAL DATA:  Abnormal CT scan.  Abdominal pain. EXAM: NUCLEAR MEDICINE HEPATOBILIARY IMAGING TECHNIQUE: Sequential images of the abdomen were obtained out to 60 minutes following intravenous administration of radiopharmaceutical. RADIOPHARMACEUTICALS:  5.3 mCi Tc-5756m  Choletec IV COMPARISON:  None. FINDINGS: Prompt uptake and biliary excretion of activity by the liver is seen. The gallbladder does not fill during 2 hours of imaging. Biliary activity passes into small bowel, consistent with patent common bile duct. IMPRESSION: The gallbladder does not fill during 2 hours of imaging. Given the CT findings, findings are most consistent with acute cholecystitis. Lack of gallbladder  filling can also be seen in the setting of chronic cholecystitis, recent meal, prolonged fasting, or severe acute illness. Electronically Signed   By: Gerome Samavid  Williams III M.D   On: 07/16/2018 12:02   Ct Abdomen Pelvis W Contrast  Result Date: 07/15/2018 CLINICAL DATA:  Acute generalized abdominal pain.  Vomiting. EXAM: CT ABDOMEN AND PELVIS WITH CONTRAST TECHNIQUE: Multidetector CT imaging of the abdomen and pelvis was performed using the standard protocol following bolus administration of intravenous contrast. CONTRAST:  100mL OMNIPAQUE IOHEXOL 300 MG/ML  SOLN COMPARISON:  None. FINDINGS: Lower chest: Minimal bibasilar posterior subsegmental atelectasis is noted. Hepatobiliary: No biliary dilatation is noted. The liver is unremarkable. Multiple gallstones are noted with the largest measuring 4 cm in the neck of the gallbladder. Mild gallbladder dilatation is noted with mild surrounding fluid and inflammatory changes concerning for acute cholecystitis. Pancreas: Unremarkable. No pancreatic ductal dilatation or surrounding inflammatory changes. Spleen: Normal in size without focal abnormality. Adrenals/Urinary Tract: Adrenal glands are unremarkable. Kidneys are normal, without renal calculi, focal lesion, or hydronephrosis. Bladder is unremarkable. Stomach/Bowel: Stomach is within normal limits. Appendix appears normal. No evidence of bowel wall thickening, distention, or inflammatory changes. Vascular/Lymphatic: Aortic atherosclerosis. No enlarged abdominal or pelvic lymph nodes. Reproductive: Prostate is unremarkable. Other: Moderate fat containing right inguinal hernia is noted. No ascites is noted. Musculoskeletal: No acute or significant osseous findings. IMPRESSION: Cholelithiasis is noted with 4 cm gallstone noted in neck of gallbladder. Mild gallbladder dilatation is noted with mild surrounding fluid and inflammatory changes concerning  for acute cholecystitis. Moderate size fat containing right inguinal  hernia. Aortic Atherosclerosis (ICD10-I70.0). Electronically Signed   By: Lupita Raider, M.D.   On: 105/16/202019 13:39   Ir Perc Cholecystostomy  Result Date: 07/17/2018 INDICATION: Acute cholecystitis EXAM: CHOLECYSTOSTOMY MEDICATIONS: Vancomycin 1 g ANESTHESIA/SEDATION: Fentanyl 75 mcg IV; Versed 1 mg IV Moderate Sedation Time:  13 minutes The patient was continuously monitored during the procedure by the interventional radiology nurse under my direct supervision. FLUOROSCOPY TIME:  Fluoroscopy Time: 1 minutes 18 seconds (10 mGy). COMPLICATIONS: None immediate. PROCEDURE: Informed written consent was obtained from the patient after a thorough discussion of the procedural risks, benefits and alternatives. All questions were addressed. Maximal Sterile Barrier Technique was utilized including caps, mask, sterile gowns, sterile gloves, sterile drape, hand hygiene and skin antiseptic. A timeout was performed prior to the initiation of the procedure. The right upper quadrant was prepped with ChloraPrep in a sterile fashion, and a sterile drape was applied covering the operative field. A sterile gown and sterile gloves were used for the procedure. A 21 gauge needle was inserted into the gallbladder lumen under sonographic guidance and via transhepatic approach. It was removed over a 018 wire, which was upsized to a 3-J. A 10-French drain was advanced over the wire and coiled in the gallbladder lumen. It was sewn to the skin after being string fixed. FINDINGS: Imaging demonstrates placement of a 10 French drain into the lumen of the gallbladder. Contrast fills the gallbladder. A gallstone is noted. The cystic duct is occluded. IMPRESSION: Successful cholecystostomy. This needs to remain in place at least 6 weeks. Electronically Signed   By: Jolaine Click M.D.   On: 07/17/2018 16:30    Labs:  CBC: Recent Labs    07/15/18 0528 07/16/18 0246 07/17/18 0145 07/18/18 0830  WBC 16.6* 23.9* 22.0* 14.1*  HGB 12.7*  12.2* 11.6* 10.9*  HCT 41.9 38.6* 38.5* 35.8*  PLT 184 174 176 191    COAGS: Recent Labs    03/23/18 0317 06/14/18 1648 07/17/18 1415  INR 1.11 1.00 1.06  APTT 38* 38*  --     BMP: Recent Labs    07/15/18 0528 07/16/18 0246 07/17/18 0145 07/18/18 0830  NA 135 137 138 140  K 3.8 3.6 3.5 3.6  CL 97* 100 104 106  CO2 23 23 26 24   GLUCOSE 352* 328* 283* 307*  BUN 10 9 8 10   CALCIUM 9.7 9.1 9.0 8.6*  CREATININE 1.10 1.11 0.96 1.10  GFRNONAA 59* 58* >60 59*  GFRAA >60 >60 >60 >60    LIVER FUNCTION TESTS: Recent Labs    03/23/18 0317 06/14/18 1648 07/15/18 0528 07/17/18 0145  BILITOT 0.7 0.5 0.9 0.7  AST 18 15 15 15   ALT 12 13 10 12   ALKPHOS 57 59 63 70  PROT 6.6 7.1 7.5 6.8  ALBUMIN 3.5 3.4* 3.5 2.7*    Assessment and Plan:  Acute cholecystitis S/P Perc chole yesterday by Dr. Bess Kinds in good position.  Patient going for head CT to evaluate AMS.  Electronically Signed: Gwynneth Macleod, PA-C 07/18/2018, 11:44 AM    I spent a total of 15 Minutes at the the patient's bedside AND on the patient's hospital floor or unit, greater than 50% of which was counseling/coordinating care for f/u perc. Chole.

## 2018-07-18 NOTE — Progress Notes (Signed)
Pt temp is 100.9 paged covering service

## 2018-07-18 NOTE — Significant Event (Signed)
Rapid Response Event Note  Overview:  Called to assist with patient with decreased LOC Time Called: 0831 Arrival Time: 0840 Event Type: Neurologic  Initial Focused Assessment:  RN Dahlia ClientHannah and Dr. Lafe GarinGherge at bedside.  Skin warm and dry. Opens eyes halfway and briefly to daughters voice.  Does not follow commands.  Grimaces to [name].  Left facial droop is residual from previous stroke.  Daughter reports similar episodes at home stating he usually sleeps til 1100 evey day.  Right side perc chole drain intact - green bile return.  Abd soft.  VSS - CBG 200's - see flowsheet for details. Rectal temp done 101.    Interventions:  ABG done - WNL.  For CT head and blood work per order MD.  NAD.  Handoff to Dahlia ClientHannah - will follow. F/U:  Labs WBC trending down o/w  unrevealing - CT  Head stable. 1400:  Follow up - PT in room - patient with some slow interactions but remians somulent. OOB with PT to chair - more alert in chair.  VSS.   Plan of Care (if not transferred): Monitor on 6N if labs and CT unrevealing.    Event Summary: Name of Physician Notified: Dr. Elvera LennoxGherghe at (pta RRT)    at    Outcome: Stayed in room and stabalized  Event End Time: 0915  Tom Wang, Tom Wang

## 2018-07-18 NOTE — Progress Notes (Signed)
   07/18/18 1300  Clinical Encounter Type  Visited With Patient and family together  Visit Type Follow-up;Spiritual support;Social support  Spiritual Encounters  Spiritual Needs Emotional;Prayer  Stress Factors  Patient Stress Factors Exhausted  Family Stress Factors Exhausted   F/u visit w/ pt and his family.  Pt did not interact w/ me today, kept eyes closed while I spoke to him.  Prayed w/ the three of them.  Spoke w/ family members, encouraged them to nap since they were tired.  Listened to stories of God's faithfulness in the past.  Margretta Sidlendrea M Lorin Gawron Chaplain resident, [phone]

## 2018-07-18 NOTE — Plan of Care (Signed)

## 2018-07-18 NOTE — Consult Note (Addendum)
Neurology Consultation  Reason for Consult: Stroke Referring Physician: Elvera Lennox  CC: Stroke  History is obtained from: Chart and patient  HPI: Tom Wang is a 84 y.o. male with history of stroke, pneumonia, hypertension, diabetes.  Patient was initially brought by ambulance from home because of sudden onset nausea and vomiting around lunchtime on 07/14/2018.  Patient was found to have sepsis in the setting of cholelithiasis with a positive HIDA scan.  On 12/17 patient was found to be unresponsive and unable to wake up with sternal rub.  Per notes apparently the daughter said "patient does this all the time at home".  Patient was brought to MRI due to this issue and was found to have old a new infarcts in multiple vascular distributions.  "Scattered small acute infarcts in the left PCA territory with no associated hemorrhage or mass effect.  Along with expected evolution of left thalamic and left pontine infarcts from previous November stroke."  Chart review : Patient recently seen by the stroke team on November/13/2019 for stroke.  At that time his chief complaint was dysarthria and word salad.  Stroke work-up at that time had a echocardiogram showing a EF of 65 to 70% with wall motion that was normal.  LDL was 146, HbA1c was 98, MRI did show left thalamic and left pontine infarct secondary to small vessel disease old right basal ganglia infarcts.   LKW: 07/17/2018 tpa given?: no, last known well unknown Premorbid modified Rankin scale (mRS): 0  ROS: Unable to obtain due to altered mental status.   Past Medical History:  Diagnosis Date  . Arthritis    ankles  . Diabetes mellitus    oral meds only  . GERD (gastroesophageal reflux disease)   . Hypertension   . Pneumonia    20 years ago .  [facility])     Family History  Problem Relation Age of Onset  . Other Grandchild    Social History:   reports that he has never smoked. He has never used smokeless tobacco. He reports  that he does not drink alcohol or use drugs.  Medications  Current Facility-Administered Medications:  .  acetaminophen (TYLENOL) tablet 500 mg, 500 mg, Oral, Daily, Myrtie Neither, MD, 500 mg at 07/18/18 1436 .  amLODipine (NORVASC) tablet 10 mg, 10 mg, Oral, Daily, Leatha Gilding, MD, 10 mg at 07/17/18 1730 .  aspirin tablet 325 mg, 325 mg, Oral, Daily, Leatha Gilding, MD, 325 mg at 07/18/18 1435 .  atorvastatin (LIPITOR) tablet 40 mg, 40 mg, Oral, q1800, Myrtie Neither, MD .  cholecalciferol (VITAMIN D3) tablet 500 Units, 500 Units, Oral, QHS, Myrtie Neither, MD, 500 Units at 07/17/18 2231 .  enoxaparin (LOVENOX) injection 70 mg, 70 mg, Subcutaneous, Q24H, Gherghe, Costin M, MD .  ertapenem [facility]) 1,000 mg in sodium chloride 0.9 % 100 mL IVPB, 1 g, Intravenous, Q24H, Myrtie Neither, MD, Last Rate: 200 mL/hr at 07/18/18 0600 .  glipiZIDE (GLUCOTROL) tablet 10 mg, 10 mg, Oral, BID AC, Myrtie Neither, MD, 10 mg at 07/17/18 2238 .  hydrALAZINE (APRESOLINE) injection 10 mg, 10 mg, Intravenous, Q4H PRN, Leatha Gilding, MD, 10 mg at 07/17/18 2225 .  ketorolac (TORADOL) 15 MG/ML injection 15 mg, 15 mg, Intravenous, Q8H PRN, Gherghe, Costin M, MD .  lidocaine (LIDODERM) 5 % 1 patch, 1 patch, Transdermal, Q24H, Leatha Gilding, MD, 1 patch at 07/17/18 2306 .  milk and molasses enema, 1 enema, Rectal, Once, Gherghe, Daylene Katayama, MD .  pantoprazole (  PROTONIX) EC tablet 40 mg, 40 mg, Oral, Daily, Myrtie NeitherUgah, Nwannadiya, MD, Stopped at 07/17/18 1413 .  senna-docusate (Senokot-S) tablet 2 tablet, 2 tablet, Oral, Daily, Myrtie NeitherUgah, Nwannadiya, MD, Stopped at 07/17/18 1413 .  sodium chloride flush (NS) 0.9 % injection 5 mL, 5 mL, Intracatheter, Q8H, Hoss, Arthur, MD, 5 mL at 07/18/18 0547 .  vitamin B-12 (CYANOCOBALAMIN) tablet 50 mcg, 50 mcg, Oral, Daily, Myrtie NeitherUgah, Nwannadiya, MD, 50 mcg at 07/18/18 1436   Exam: Current vital signs: BP (!) 124/47 (BP Location: Left Arm)   Pulse 69   Temp 98.5 F  (36.9 C) (Oral)   Resp 16   Ht 6\' 2"  (1.88 m)   Wt (!) 147.4 kg   SpO2 91%   BMI 41.73 kg/m  Vital signs in last 24 hours: Temp:  [98.5 F (36.9 C)-100.9 F (38.3 C)] 98.5 F (36.9 C) (12/17 1442) Pulse Rate:  [69-119] 69 (12/17 0823) Resp:  [16] 16 (12/17 0516) BP: (124-182)/(47-76) 124/47 (12/17 1442) SpO2:  [91 %-99 %] 91 % (12/17 1442)  Physical Exam  Constitutional: Appears well-developed and well-nourished.  Psych: Affect appropriate to situation Eyes: No scleral injection HENT: No OP obstrucion Head: Normocephalic.  Cardiovascular: Normal rate and regular rhythm.  Respiratory: Effort normal, non-labored breathing GI: Soft.  No distension. There is no tenderness.  Skin: WDI  Neuro: Mental Status: Currently very lethargic, does not follow commands, grimaces to pain is slightly withdraws from pain in all extremities. Cranial Nerves: II: No blink to threat III,IV, VI: Positive doll's.  Pupils are pinpoint V: Facial sensation is symmetric to temperature VII: Facial droop secondary to Bell's palsy in the past VIII: Opens eyes to voice  Motor: No increased tone.  Slightly withdraws left arm and right arm from pain.  Grimaces to pain lower extremities but does not move them. Sensory: Persistent pain noxious stimuli Deep Tendon Reflexes: 1+ in the upper extremities no lower extremity deep tendon reflexes Plantars: Toes are downgoing bilaterally.  Cerebellar: Unable to obtain     Labs I have reviewed labs in epic and the results pertinent to this consultation are:   CBC    Component Value Date/Time   WBC 14.1 (H) 07/18/2018 0830   RBC 4.46 07/18/2018 0830   HGB 10.9 (L) 07/18/2018 0830   HCT 35.8 (L) 07/18/2018 0830   PLT 191 07/18/2018 0830   MCV 80.3 07/18/2018 0830   MCH 24.4 (L) 07/18/2018 0830   MCHC 30.4 07/18/2018 0830   RDW 15.3 07/18/2018 0830   LYMPHSABS 1.3 12020-07-1218 0528   MONOABS 2.8 (H) 12020-07-1218 0528   EOSABS 0.0 12020-07-1218 0528    BASOSABS 0.0 12020-07-1218 0528    CMP     Component Value Date/Time   NA 140 07/18/2018 0830   K 3.6 07/18/2018 0830   CL 106 07/18/2018 0830   CO2 24 07/18/2018 0830   GLUCOSE 307 (H) 07/18/2018 0830   BUN 10 07/18/2018 0830   CREATININE 1.10 07/18/2018 0830   CALCIUM 8.6 (L) 07/18/2018 0830   PROT 6.8 07/17/2018 0145   ALBUMIN 2.7 (L) 07/17/2018 0145   AST 15 07/17/2018 0145   ALT 12 07/17/2018 0145   ALKPHOS 70 07/17/2018 0145   BILITOT 0.7 07/17/2018 0145   GFRNONAA 59 (L) 07/18/2018 0830   GFRAA >60 07/18/2018 0830    Lipid Panel     Component Value Date/Time   CHOL 224 (H) 06/15/2018 0552   TRIG 115 06/15/2018 0552   HDL 55 06/15/2018 0552   CHOLHDL 4.1  06/15/2018 0552   VLDL 23 06/15/2018 0552   LDLCALC 146 (H) 06/15/2018 0552     Imaging I have reviewed the images obtained:  CT-scan of the brain-no acute abnormality  MRI examination of the brain- scattered small acute infarcts in the left PCA territory with no associated hemorrhage.  Expected evolution of the November left thalamic and left pontine infarct.  Felicie Morn PA-C [facility] [phone]  M-F  (9:00 am- 5:00 PM)  07/18/2018, 5:18 PM    NEUROHOSPITALIST ADDENDUM Performed a face to face diagnostic evaluation.   I have reviewed the contents of history and physical exam as documented by PA/ARNP/Resident and agree with above documentation.  I have discussed and formulated the plan as documented. Edits to the note have been made as needed.      ASSESSMENT AND PLAN   84 year old male with new scattered small acute infarcts in left PCA territory and old left thalamic left pontine infarct.  MRI brain obtained as patient lethargic-however small infarcts does not explain patient's mental status.  I suspect patient is lethargic due to pain medications and sepsis.    Acute ischemic stroke -punctate infarctions   - recently completed stroke workup - repeat transthoracic echo to  look for endocarditis given sepsis - blood cultures- so far negative - Hold AP for now, can start patient on Plavix after echocardiogram if negative for vegetation - Start or continue Atorvastatin 40 mg/other high intensity statin # BP goal: normotension, avoid extreme fluctuation in blood pressure # HBAIC and Lipid profile # Telemetry monitoring # Frequent neuro checks # stroke swallow screen   Metabolic encephalopathy  -Avoid sedating medications -Continue to treat infection      Georgiana Spinner Adonna Horsley MD [facility] [phone]   If 7pm to 7am, please call on call as listed on AMION.

## 2018-07-18 NOTE — Progress Notes (Signed)
RN found pt unresponsive. Pt unable to wake up to sternal rub. CBG 279. BP 152/56 (82) HR 69 RR 18 O2 93 RA. Dr. Wyonia HoughGerghe updated and in room to assess pt. RR updated. Verbal order to removed clonidine patch.   Versie StarksHanna  Mikaya Bunner, RN

## 2018-07-18 NOTE — Progress Notes (Signed)
CC: Abdominal pain, nausea, vomiting, increased confusion and lethargic.  Subjective: Patient still somnolent.  He did not open his eyes the entire time I was in the room.  His daughter now thinks it is from the blood pressure medicines and constipation.  She notes he has not had a bowel movement since last Wednesday. Drain is in place and working.  He had nothing for pain yesterday.  Objective: Vital signs in last 24 hours: Temp:  [97.5 F (36.4 C)-100.9 F (38.3 C)] 100 F (37.8 C) (12/17 0920) Pulse Rate:  [69-119] 69 (12/17 0823) Resp:  [16-31] 16 (12/17 0516) BP: (138-232)/(56-110) 152/56 (12/17 0823) SpO2:  [94 %-100 %] 94 % (12/17 0823) Last BM Date: 07/12/18 2825 IV Urine 400 recorded IR drain 150 T-max 100.9 at 3 AM.  Is also tachycardic earlier in the evening last heart rate was 69. BMP is normal except for glucose of 307 Blood gases at 9 AM pH 7.40, PCO2 40, PO2 of 64.3, bicarb 24.5. WBC is down to 14.1 Hemoglobin 10.9, hematocrit 35.8, platelets 191,000.  Intake/Output from previous day: 12/16 0701 - 12/17 0700 In: 2925.9 [I.V.:2825.9; IV Piggyback:100] Out: 550 [Urine:400; Drains:150] Intake/Output this shift: No intake/output data recorded.  General appearance: Patient is somnolent, he was completely unresponsive when I was in the room.  His daughter thinks this is from blood pressure medicines and constipation. Resp: He is not breathing very deeply, but is clear to auscultation anterior exam. GI: During the exam today there was no response.  He did not seem tender around the drain site.  The drainage from the IR drain is a reddish serous looking fluid.  No abdominal distention or peritonitis noted.  Lab Results:  Recent Labs    07/17/18 0145 07/18/18 0830  WBC 22.0* 14.1*  HGB 11.6* 10.9*  HCT 38.5* 35.8*  PLT 176 191    BMET Recent Labs    07/17/18 0145 07/18/18 0830  NA 138 140  K 3.5 3.6  CL 104 106  CO2 26 24  GLUCOSE 283* 307*  BUN  8 10  CREATININE 0.96 1.10  CALCIUM 9.0 8.6*   PT/INR Recent Labs    07/17/18 1415  LABPROT 13.7  INR 1.06    Recent Labs  Lab 07/15/18 0528 07/17/18 0145  AST 15 15  ALT 10 12  ALKPHOS 63 70  BILITOT 0.9 0.7  PROT 7.5 6.8  ALBUMIN 3.5 2.7*     Lipase  No results found for: LIPASE   Medications: . acetaminophen  500 mg Oral Daily  . amLODipine  10 mg Oral Daily  . aspirin  300 mg Rectal Daily  . atorvastatin  40 mg Oral q1800  . cholecalciferol  500 Units Oral QHS  . cloNIDine  0.1 mg Oral Once  . enoxaparin (LOVENOX) injection  70 mg Subcutaneous Q24H  . glipiZIDE  10 mg Oral BID AC  . lidocaine  1 patch Transdermal Q24H  . pantoprazole  40 mg Oral Daily  . senna-docusate  2 tablet Oral Daily  . sodium chloride flush  5 mL Intracatheter Q8H  . vitamin B-12  50 mcg Oral Daily    Assessment/Plan Somnolent  CVA left 2014, right Nov 2019  - bed to wheel chair at home Hypertension Type 2 diabetes    Cholelithiasis -with positive HIDA scan IR percutaneous cholecystostomy 07/17/2018  - HIDA positive  - WBC 16.6  >> 23.9 >> 22.0 >> 14.1 today  FEN:  IV fluids/regular diet ID:  Invanz 12/14 =>> day 3 DVT: Lovenox Follow up:  Dr. Luisa Hartornett  Plan:  He has been somnolent and unresponsive since this morning.  Dr. Elvera LennoxGherghe is following and a CT is pending.  He was not responsive at all when I saw him.  Stable from GB standpoint.    LOS: 3 days    Alysabeth Scalia 07/18/2018 [phone]

## 2018-07-19 ENCOUNTER — Other Ambulatory Visit: Payer: Self-pay | Admitting: Physician Assistant

## 2018-07-19 ENCOUNTER — Inpatient Hospital Stay (HOSPITAL_COMMUNITY): Payer: Medicare HMO

## 2018-07-19 DIAGNOSIS — K8043 Calculus of bile duct with acute cholecystitis with obstruction: Secondary | ICD-10-CM

## 2018-07-19 DIAGNOSIS — I679 Cerebrovascular disease, unspecified: Secondary | ICD-10-CM

## 2018-07-19 DIAGNOSIS — I370 Nonrheumatic pulmonary valve stenosis: Secondary | ICD-10-CM

## 2018-07-19 DIAGNOSIS — I361 Nonrheumatic tricuspid (valve) insufficiency: Secondary | ICD-10-CM

## 2018-07-19 LAB — COMPREHENSIVE METABOLIC PANEL
ALBUMIN: 2.4 g/dL — AB (ref 3.5–5.0)
ALT: 17 U/L (ref 0–44)
AST: 18 U/L (ref 15–41)
Alkaline Phosphatase: 67 U/L (ref 38–126)
Anion gap: 9 (ref 5–15)
BUN: 12 mg/dL (ref 8–23)
CO2: 24 mmol/L (ref 22–32)
Calcium: 8.9 mg/dL (ref 8.9–10.3)
Chloride: 105 mmol/L (ref 98–111)
Creatinine, Ser: 1.08 mg/dL (ref 0.61–1.24)
GFR calc Af Amer: >60 mL/min (ref >60)
GFR calc non Af Amer: >60 mL/min (ref >60)
GLUCOSE: 270 mg/dL — AB (ref 70–99)
Potassium: 3.3 mmol/L — ABNORMAL LOW (ref 3.5–5.1)
Sodium: 138 mmol/L (ref 135–145)
Total Bilirubin: 0.7 mg/dL (ref 0.3–1.2)
Total Protein: 6 g/dL — ABNORMAL LOW (ref 6.5–8.1)

## 2018-07-19 LAB — ECHOCARDIOGRAM COMPLETE
Height: 74 in
Weight: 5200 oz

## 2018-07-19 LAB — GLUCOSE, CAPILLARY
GLUCOSE-CAPILLARY: 217 mg/dL — AB (ref 70–99)
GLUCOSE-CAPILLARY: 246 mg/dL — AB (ref 70–99)
Glucose-Capillary: 218 mg/dL — ABNORMAL HIGH (ref 70–99)
Glucose-Capillary: 243 mg/dL — ABNORMAL HIGH (ref 70–99)
Glucose-Capillary: 200 mg/dL — ABNORMAL HIGH (ref 70–99)

## 2018-07-19 LAB — CBC
HCT: 35.5 % — ABNORMAL LOW (ref 39.0–52.0)
Hemoglobin: 11.5 g/dL — ABNORMAL LOW (ref 13.0–17.0)
MCH: 26 pg (ref 26.0–34.0)
MCHC: 32.4 g/dL (ref 30.0–36.0)
MCV: 80.3 fL (ref 80.0–100.0)
Platelets: 169 10*3/uL (ref 150–400)
RBC: 4.42 MIL/uL (ref 4.22–5.81)
RDW: 15.4 % (ref 11.5–15.5)
WBC: 14 10*3/uL — ABNORMAL HIGH (ref 4.0–10.5)
nRBC: 0 % (ref 0.0–0.2)

## 2018-07-19 MED ORDER — GLUCERNA SHAKE PO LIQD
237.0000 mL | Freq: Three times a day (TID) | ORAL | Status: DC
  Administered 2018-07-19 – 2018-07-20 (×4): 237 mL via ORAL

## 2018-07-19 MED ORDER — APIXABAN 5 MG PO TABS
5.0000 mg | ORAL_TABLET | Freq: Two times a day (BID) | ORAL | Status: DC
  Administered 2018-07-19 – 2018-07-27 (×15): 5 mg via ORAL
  Filled 2018-07-19 (×17): qty 1

## 2018-07-19 MED ORDER — POLYETHYLENE GLYCOL 3350 17 G PO PACK
17.0000 g | PACK | Freq: Every day | ORAL | Status: DC
  Administered 2018-07-19: 17 g via ORAL
  Filled 2018-07-19 (×3): qty 1

## 2018-07-19 MED ORDER — ADULT MULTIVITAMIN W/MINERALS CH
1.0000 | ORAL_TABLET | Freq: Every day | ORAL | Status: DC
  Administered 2018-07-19 – 2018-07-20 (×2): 1 via ORAL
  Filled 2018-07-19 (×2): qty 1

## 2018-07-19 MED ORDER — PERFLUTREN LIPID MICROSPHERE
1.0000 mL | INTRAVENOUS | Status: AC | PRN
  Administered 2018-07-19: 2 mL via INTRAVENOUS
  Filled 2018-07-19: qty 10

## 2018-07-19 MED ORDER — METFORMIN HCL 850 MG PO TABS
850.0000 mg | ORAL_TABLET | Freq: Every day | ORAL | Status: DC
  Filled 2018-07-19 (×3): qty 1

## 2018-07-19 NOTE — Progress Notes (Signed)
Initial Nutrition Assessment  DOCUMENTATION CODES:   Morbid obesity  INTERVENTION:   -Glucerna Shake po TID, each supplement provides 220 kcal and 10 grams of protein -MVI with minerals daily  NUTRITION DIAGNOSIS:   Inadequate oral intake related to lethargy/confusion as evidenced by meal completion < 50%, per patient/family report.  GOAL:   Patient will meet greater than or equal to 90% of their needs  MONITOR:   PO intake, Supplement acceptance, Labs, Weight trends, Skin, I & O's  REASON FOR ASSESSMENT:   Low Braden    ASSESSMENT:   Tom GessLawrence Wang is a 84 y.o. male with medical history significant for diabetes mellitus recent stroke hypertension GERD, who was brought by ambulance from home because of sudden onset of nausea and vomiting around lunchtime yesterday family reported patient had been less active than usual seem more confused and tired and sleeping a lot.  He had complained of an abdominal pain earlier but not recently.  He stated that he does not usually usually complain a lot.  Do think he may have also have some fever and they were concerned about possible infection.  He does have a little bit of cough with a little congestion he denied any diarrhea.    Pt admitted with acute cholecystitis.   12/15- HIDA scan positive for cholecystitis  12/16- s/p percutaneous cholecystostomy  12/17- sp neurology evaluation- pt with acute ischemic stroke- punctate infarctions  Pt very lethargic at time of visit, but briefly opened his eyes when RD called his name. History obtained from daughter and granddaughter at bedside. They report pt is usually a great eater- eats 3 hearty meals per day. Per daughter, pt enjoys eating vegetables; favorite foods consist of chicken, cabbage, pork chops, beef livers, and noodles.   Pt's intake has decreased during hospitalization, mainly due to lethargy. Noted pt consumed about 50% of breakfast tray. Lunch tray was just delivered and  unattempted, as pt is not alert enough to eat at this time.   Both daughter and granddaughter deny pt with any weight loss.   Took pt's food preferences and put into healthtouch system. Also added supplements and MVI due to decreased intake.   Last Hgb A1c: 9.5 (07/16/18). PTA DM medications are 10 mg glipizide BID. Spoke with pt daughter regarding DM control at home. Per daughter, pt with consistently high blood sugars; usually runs between 300-500. Pt daughter refuses insulin initiation due to pt having stroke-like activity after being administered insulin.   Labs reviewed: K: 3.3, CBGS: 218-279 (inpatient orders for glycemic control are 850 mg metformin daily).     NUTRITION - FOCUSED PHYSICAL EXAM:    Most Recent Value  Orbital Region  No depletion  Upper Arm Region  No depletion  Thoracic and Lumbar Region  No depletion  Buccal Region  No depletion  Temple Region  No depletion  Clavicle Bone Region  No depletion  Clavicle and Acromion Bone Region  No depletion  Scapular Bone Region  No depletion  Dorsal Hand  No depletion  Patellar Region  No depletion  Anterior Thigh Region  No depletion  Posterior Calf Region  No depletion  Edema (RD Assessment)  Moderate  Hair  Reviewed  Eyes  Reviewed  Mouth  Reviewed  Skin  Reviewed  Nails  Reviewed       Diet Order:   Diet Order            Diet regular Room service appropriate? Yes; Fluid consistency: Thin  Diet effective now  EDUCATION NEEDS:   Education needs have been addressed  Skin:  Skin Assessment: Skin Integrity Issues: Skin Integrity Issues:: Incisions, Diabetic Ulcer Diabetic Ulcer: lt toe Incisions: closed abdomen  Last BM:  07/12/18  Height:   Ht Readings from Last 1 Encounters:  07/15/18 6\' 2"  (1.88 m)    Weight:   Wt Readings from Last 1 Encounters:  07/15/18 (!) 147.4 kg    Ideal Body Weight:  86.4 kg  BMI:  Body mass index is 41.73 kg/m.  Estimated Nutritional Needs:    Kcal:  2000-2200  Protein:  95-110 grams  Fluid:  2.0-2.2 L    Daniel Ritthaler A. Mayford Knife, RD, LDN, CDE Pager: [phone] After hours Pager: [phone]

## 2018-07-19 NOTE — Discharge Instructions (Addendum)
6 week follow up with IR Clinic in place. The drain clinic should call for appointment. [phone] Needs to flush daily 5-10 cc daily. Record OP    Information on my medicine - ELIQUIS (apixaban)  This medication education was reviewed with me or my healthcare representative as part of my discharge preparation.  The pharmacist that spoke with me during my hospital stay was:  Lennon AlstromDang, Thuy Dien, [facility]  Why was Eliquis prescribed for you? Eliquis was prescribed for you to reduce the risk of a blood clot forming that can cause a stroke if you have a medical condition called atrial fibrillation (a type of irregular heartbeat).  What do You need to know about Eliquis ? Take your Eliquis TWICE DAILY - one tablet in the morning and one tablet in the evening with or without food. If you have difficulty swallowing the tablet whole please discuss with your pharmacist how to take the medication safely.  Take Eliquis exactly as prescribed by your doctor and DO NOT stop taking Eliquis without talking to the doctor who prescribed the medication.  Stopping may increase your risk of developing a stroke.  Refill your prescription before you run out.  After discharge, you should have regular check-up appointments with your healthcare provider that is prescribing your Eliquis.  In the future your dose may need to be changed if your kidney function or weight changes by a significant amount or as you get older.  What do you do if you miss a dose? If you miss a dose, take it as soon as you remember on the same day and resume taking twice daily.  Do not take more than one dose of ELIQUIS at the same time to make up a missed dose.  Important Safety Information A possible side effect of Eliquis is bleeding. You should call your healthcare provider right away if you experience any of the following: ? Bleeding from an injury or your nose that does not stop. ? Unusual colored urine (red or dark brown) or unusual  colored stools (red or black). ? Unusual bruising for unknown reasons. ? A serious fall or if you hit your head (even if there is no bleeding).  Some medicines may interact with Eliquis and might increase your risk of bleeding or clotting while on Eliquis. To help avoid this, consult your healthcare provider or pharmacist prior to using any new prescription or non-prescription medications, including herbals, vitamins, non-steroidal anti-inflammatory drugs (NSAIDs) and supplements.  This website has more information on Eliquis (apixaban): http://[email or web address].com/eliquis/home

## 2018-07-19 NOTE — Progress Notes (Addendum)
STROKE TEAM PROGRESS NOTE   SUBJECTIVE (INTERVAL HISTORY) His 2 daughters are at the bedside.  Patient is very lethargic, sleeping during the entire rounding.  Hard to arouse but able to have facial movement with stimulation.   OBJECTIVE Temp:  [98 F (36.7 C)-98.9 F (37.2 C)] 98 F (36.7 C) (12/18 1413) Pulse Rate:  [63-76] 63 (12/18 1413) Cardiac Rhythm: Normal sinus rhythm;Bundle branch block (12/18 0700) Resp:  [17-24] 18 (12/18 1413) BP: (128-168)/(46-73) 140/46 (12/18 1413) SpO2:  [93 %-100 %] 98 % (12/18 1413)  Recent Labs  Lab 07/18/18 1711 07/18/18 2235 07/19/18 0835 07/19/18 1118 07/19/18 1227  GLUCAP 279* 269* 218* 217* 243*   Recent Labs  Lab 07/15/18 0528 07/16/18 0246 07/17/18 0145 07/18/18 0830 07/19/18 0222  NA 135 137 138 140 138  K 3.8 3.6 3.5 3.6 3.3*  CL 97* 100 104 106 105  CO2 23 23 26 24 24   GLUCOSE 352* 328* 283* 307* 270*  BUN 10 9 8 10 12   CREATININE 1.10 1.11 0.96 1.10 1.08  CALCIUM 9.7 9.1 9.0 8.6* 8.9   Recent Labs  Lab 07/15/18 0528 07/17/18 0145 07/19/18 0222  AST 15 15 18   ALT 10 12 17   ALKPHOS 63 70 67  BILITOT 0.9 0.7 0.7  PROT 7.5 6.8 6.0*  ALBUMIN 3.5 2.7* 2.4*   Recent Labs  Lab 07/15/18 0528 07/16/18 0246 07/17/18 0145 07/18/18 0830 07/19/18 0222  WBC 16.6* 23.9* 22.0* 14.1* 14.0*  NEUTROABS 12.3*  --   --   --   --   HGB 12.7* 12.2* 11.6* 10.9* 11.5*  HCT 41.9 38.6* 38.5* 35.8* 35.5*  MCV 81.7 80.4 81.9 80.3 80.3  PLT 184 174 176 191 169   No results for input(s): CKTOTAL, CKMB, CKMBINDEX, TROPONINI in the last 168 hours. Recent Labs    07/17/18 1415  LABPROT 13.7  INR 1.06   No results for input(s): COLORURINE, LABSPEC, PHURINE, GLUCOSEU, HGBUR, BILIRUBINUR, KETONESUR, PROTEINUR, UROBILINOGEN, NITRITE, LEUKOCYTESUR in the last 72 hours.  Invalid input(s): APPERANCEUR     Component Value Date/Time   CHOL 224 (H) 06/15/2018 0552   TRIG 115 06/15/2018 0552   HDL 55 06/15/2018 0552   CHOLHDL 4.1  06/15/2018 0552   VLDL 23 06/15/2018 0552   LDLCALC 146 (H) 06/15/2018 0552   Lab Results  Component Value Date   HGBA1C 9.5 (H) 07/16/2018      Component Value Date/Time   LABOPIA NONE DETECTED 06/14/2018 1648   COCAINSCRNUR NONE DETECTED 06/14/2018 1648   LABBENZ NONE DETECTED 06/14/2018 1648   AMPHETMU NONE DETECTED 06/14/2018 1648   THCU NONE DETECTED 06/14/2018 1648   LABBARB NONE DETECTED 06/14/2018 1648    No results for input(s): ETH in the last 168 hours.  I have personally reviewed the radiological images below and agree with the radiology interpretations.  Dg Chest 2 View  Result Date: 07/15/2018 CLINICAL DATA:  Fever and vomiting. Possible infection. EXAM: CHEST - 2 VIEW COMPARISON:  06/14/2018. FINDINGS: Cardiomegaly. Low lung volumes. No consolidation or edema. No effusion or pneumothorax. Calcified tortuous aorta. IMPRESSION: Cardiomegaly. No active disease. Electronically Signed   By: Elsie Stain M.D.   On: 07/15/2018 07:04   Ct Head Wo Contrast  Result Date: 07/18/2018 CLINICAL DATA:  84 year old who is unresponsive to stimuli, an acute change in mental status. EXAM: CT HEAD WITHOUT CONTRAST TECHNIQUE: Contiguous axial images were obtained from the base of the skull through the vertex without intravenous contrast. COMPARISON:  CT head 07/15/2018 and  earlier. MRI brain 06/15/2018 and earlier. FINDINGS: Brain: Moderate to severe age related cortical atrophy, mild deep atrophy and mild cerebellar atrophy, unchanged. Severe changes of small vessel disease of the white matter diffusely including the pons and brainstem, unchanged. No mass lesion. No midline shift. No acute hemorrhage or hematoma. No extra-axial fluid collections. No evidence of acute infarction. Vascular: Moderate BILATERAL carotid siphon and vertebral artery atherosclerosis. No hyperdense vessel. Skull: No skull fracture or other focal osseous abnormality involving the skull. Sinuses/Orbits: Small mucous  retention cyst or polyp involving the LEFT maxillary sinus. Minimal mucosal thickening involving the base of the frontal sinuses. Visualized paranasal sinuses otherwise well aerated. BILATERAL mastoid air cells and BILATERAL middle ear cavities well-aerated. Asymmetric hyperdense lens in the LEFT eye as noted previously. Orbits and globes otherwise unremarkable. Other: None. IMPRESSION: 1. No acute intracranial abnormality. 2. Stable moderate to severe age related generalized atrophy and severe chronic microvascular ischemic changes of the white matter. Electronically Signed   By: Hulan Saashomas  Muneeb M.D.   On: 07/18/2018 10:02   Ct Head Wo Contrast  Result Date: 107/09/2017 CLINICAL DATA:  Fever and vomiting. EXAM: CT HEAD WITHOUT CONTRAST TECHNIQUE: Contiguous axial images were obtained from the base of the skull through the vertex without intravenous contrast. COMPARISON:  June 14, 2018 FINDINGS: Brain: No subdural, epidural, or subarachnoid hemorrhage. Cerebellum, brainstem, and basal cisterns are normal. Ventricles and sulci are unchanged. White matter changes are stable. No acute cortical ischemia or infarct. No mass effect or midline shift. Vascular: No hyperdense vessel or unexpected calcification. Skull: Normal. Negative for fracture or focal lesion. Sinuses/Orbits: No acute finding. Other: None. IMPRESSION: 1. No acute intracranial abnormalities. No cause for fever or vomiting noted. Electronically Signed   By: Gerome Samavid  Williams III M.D   On: 107/09/2017 13:36   Mr Brain Wo Contrast  Result Date: 07/18/2018 CLINICAL DATA:  84 year old male with recent altered mental status, unresponsiveness. EXAM: MRI HEAD WITHOUT CONTRAST TECHNIQUE: Multiplanar, multiecho pulse sequences of the brain and surrounding structures were obtained without intravenous contrast. COMPARISON:  Head CTs 0937 hours today and earlier. Brain MRI 06/15/2018. FINDINGS: Brain: There are new scattered small foci of restricted  diffusion in the left occipital lobe, including about the atrium of the left lateral ventricle as seen on series 3, image 28. The left thalamic and small left pontine infarcts seen last month have evolved as expected. No associated acute hemorrhage or mass effect. No other acute ischemia. Chronic small vessel disease in both hemispheres with right side brainstem Wallerian degeneration redemonstrated. Superimposed chronic pontine small vessel disease. Occasional small microhemorrhages (including in the right dorsal pons on series 9, image 44. No midline shift, mass effect, evidence of mass lesion, ventriculomegaly, extra-axial collection or acute intracranial hemorrhage. Cervicomedullary junction and pituitary are within normal limits. Vascular: Major intracranial vascular flow voids are stable, the vertebrobasilar flow voids are abnormal corresponding to the severe posterior circulation stenosis demonstrated by CTA and MRA in August this year. Skull and upper cervical spine: Negative visible cervical spine. Normal bone marrow signal. Sinuses/Orbits: Stable, negative. Other: Mastoids remain clear. Visible internal auditory structures appear normal. Scalp and face soft tissues appear negative. IMPRESSION: 1. Scattered small acute infarcts in the left PCA territory with no associated hemorrhage or mass effect. 2. Expected evolution of the November left thalamic and left pontine infarcts. 3. Underlying severe posterior circulation atherosclerosis and stenosis as seen by CTA and MRA and August. Electronically Signed   By: Althea GrimmerH  Hall M.D.  On: 07/18/2018 16:29   Nm Hepatobiliary Liver Func  Result Date: 07/16/2018 CLINICAL DATA:  Abnormal CT scan.  Abdominal pain. EXAM: NUCLEAR MEDICINE HEPATOBILIARY IMAGING TECHNIQUE: Sequential images of the abdomen were obtained out to 60 minutes following intravenous administration of radiopharmaceutical. RADIOPHARMACEUTICALS:  5.3 mCi Tc-73m  Choletec IV COMPARISON:  None.  FINDINGS: Prompt uptake and biliary excretion of activity by the liver is seen. The gallbladder does not fill during 2 hours of imaging. Biliary activity passes into small bowel, consistent with patent common bile duct. IMPRESSION: The gallbladder does not fill during 2 hours of imaging. Given the CT findings, findings are most consistent with acute cholecystitis. Lack of gallbladder filling can also be seen in the setting of chronic cholecystitis, recent meal, prolonged fasting, or severe acute illness. Electronically Signed   By: Gerome Sam III M.D   On: 07/16/2018 12:02   Ct Abdomen Pelvis W Contrast  Result Date: 07/15/2018 CLINICAL DATA:  Acute generalized abdominal pain.  Vomiting. EXAM: CT ABDOMEN AND PELVIS WITH CONTRAST TECHNIQUE: Multidetector CT imaging of the abdomen and pelvis was performed using the standard protocol following bolus administration of intravenous contrast. CONTRAST:  OMNIPAQUE IOHEXOL 300 MG/ML  SOLN COMPARISON:  None. FINDINGS: Lower chest: Minimal bibasilar posterior subsegmental atelectasis is noted. Hepatobiliary: No biliary dilatation is noted. The liver is unremarkable. Multiple gallstones are noted with the largest measuring 4 cm in the neck of the gallbladder. Mild gallbladder dilatation is noted with mild surrounding fluid and inflammatory changes concerning for acute cholecystitis. Pancreas: Unremarkable. No pancreatic ductal dilatation or surrounding inflammatory changes. Spleen: Normal in size without focal abnormality. Adrenals/Urinary Tract: Adrenal glands are unremarkable. Kidneys are normal, without renal calculi, focal lesion, or hydronephrosis. Bladder is unremarkable. Stomach/Bowel: Stomach is within normal limits. Appendix appears normal. No evidence of bowel wall thickening, distention, or inflammatory changes. Vascular/Lymphatic: Aortic atherosclerosis. No enlarged abdominal or pelvic lymph nodes. Reproductive: Prostate is unremarkable. Other:  Moderate fat containing right inguinal hernia is noted. No ascites is noted. Musculoskeletal: No acute or significant osseous findings. IMPRESSION: Cholelithiasis is noted with 4 cm gallstone noted in neck of gallbladder. Mild gallbladder dilatation is noted with mild surrounding fluid and inflammatory changes concerning for acute cholecystitis. Moderate size fat containing right inguinal hernia. Aortic Atherosclerosis (ICD10-I70.0). Electronically Signed   By: Lupita Raider, M.D.   On: 07/15/2018 13:39   Ir Perc Cholecystostomy  Result Date: 07/17/2018 INDICATION: Acute cholecystitis EXAM: CHOLECYSTOSTOMY MEDICATIONS: Vancomycin 1 g ANESTHESIA/SEDATION: Fentanyl 75 mcg IV; Versed 1 mg IV Moderate Sedation Time:  13 minutes The patient was continuously monitored during the procedure by the interventional radiology nurse under my direct supervision. FLUOROSCOPY TIME:  Fluoroscopy Time: 1 minutes 18 seconds (10 mGy). COMPLICATIONS: None immediate. PROCEDURE: Informed written consent was obtained from the patient after a thorough discussion of the procedural risks, benefits and alternatives. All questions were addressed. Maximal Sterile Barrier Technique was utilized including caps, mask, sterile gowns, sterile gloves, sterile drape, hand hygiene and skin antiseptic. A timeout was performed prior to the initiation of the procedure. The right upper quadrant was prepped with ChloraPrep in a sterile fashion, and a sterile drape was applied covering the operative field. A sterile gown and sterile gloves were used for the procedure. A 21 gauge needle was inserted into the gallbladder lumen under sonographic guidance and via transhepatic approach. It was removed over a 018 wire, which was upsized to a 3-J. A 10-French drain was advanced over the wire and coiled in the  gallbladder lumen. It was sewn to the skin after being string fixed. FINDINGS: Imaging demonstrates placement of a 10 French drain into the lumen of the  gallbladder. Contrast fills the gallbladder. A gallstone is noted. The cystic duct is occluded. IMPRESSION: Successful cholecystostomy. This needs to remain in place at least 6 weeks. Electronically Signed   By: Jolaine Click M.D.   On: 07/17/2018 16:30    TTE Pending   PHYSICAL EXAM  Temp:  [98 F (36.7 C)-98.9 F (37.2 C)] 98 F (36.7 C) (12/18 1413) Pulse Rate:  [63-76] 63 (12/18 1413) Resp:  [17-24] 18 (12/18 1413) BP: (128-168)/(46-73) 140/46 (12/18 1413) SpO2:  [93 %-100 %] 98 % (12/18 1413)  General - Well nourished, well developed, sleepy and lethargic.  Ophthalmologic - fundi not visualized due to noncooperation.  Cardiovascular - Regular rate and rhythm.  Neuro - drowsy sleepy and lethargic, not open eyes on voice worse pain stimulation but make facial movements.  With forced eye opening, PERRL, eye midline position, not tracking, not blinking to visual threat bilaterally.  Facial symmetrical, tongue midline inside mouth.  Not cooperative on monitor exam, however, left upper extremity bicep 3/5, but right upper extremity drop to bed right away.  Bilateral lower extremity trace movement on pain summation.  DTR 1+, Babinski negative. Sensation, coordination and gait not tested.    ASSESSMENT/PLAN Mr. Tom Wang is a 84 y.o. 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  Patient developed decreased level consciousness on 07/18/2018 MRI showed infarcts. No tPA given due to unclear last seen normal.    Stroke:  left PCA territory including left mesial temporal lobe, left MCA/PCA, left thalamus punctate infarcts.  Stroke etiology uncertain, could be due to severe posterior circulation vascular stenosis in the setting of sepsis and hypotension.  Could  also be due to undiagnosed A. fib or recurrent DVT.  Less likely endocarditis given negative blood culture.  Resultant decreased level of consciousness, lethargy  MRI left PCA, left MCA/PCA, left thalamus punctate infarcts  CTA head and neck in 03/2018 severe posterior circulation vascular stenosis, especially diminutive basilar artery with multisegmental high-grade stenosis  2D Echo pending  LDL 146 in 06/2018  HgbA1c 9.5  Eliquis for VTE prophylaxis  aspirin 325 mg daily prior to admission, now on Eliquis (apixaban) daily.  Continue Eliquis 5 mg twice daily.  Ongoing aggressive stroke risk factor management  Therapy recommendations: Home health PT/OT  Disposition: Pending  History of stroke   03/2018 slurred speech and encephalopathy and recrudescence of old stroke.  MRI negative.  MRI showed basal artery stenosis and CTA head and neck showed severe posterior circulation vascular stenosis.  Carotid Doppler negative.  EF 55 to 60%.  LDL 171 and A1c 9.0.  He was on Eliquis at the time for DVT treatment and prophylaxis, continued Eliquis and Zocor on discharge.  However Eliquis dose only 2.5 mg twice daily.  05/2018 left Bell's palsy  06/14/2018, admitted for transient slurred speech and confusion.  MRI showed left thalamic, left pontine infarct, consistent with severe posterior circulation vascular stenosis.  EF 65 to 70%.  LDL 146 and A1c 9.8.  Neurology service sign off with continued Eliquis and Lipitor 40.  History of DVT   Was on Eliquis 2.5 twice daily until 06/2018 when Eliquis switched  to aspirin on discharge  However, patient baseline at home wheelchair bound or bedbound, high risk for DVT recurrence.  Given the history of DVT and high risk of recurrence, recommend Eliquis for further DVT/PE prevention.  Recommend Eliquis 5 mg twice daily  ?  Atrial fibrillation  EKG on 06/14/2018 and 06/15/2018 concerning for paroxysmal A. Fib  P waves not easily to identify  however heart rate seems regular.  Given recurrent strokes, questionable of A. fib and history of DVT and high risk of recurrence, recommend Eliquis 5 mg twice daily for further stroke prevention.  Posterior circulation vascular stenosis  MRA in 03/2018 showed basilar artery severe stenosis  CTA head and neck in 8/29 showed severe posterior circulation vascular stenosis especially basal artery  This may explain patient recurrent strokes within the posterior circulation distribution.  Currently on Eliquis 5 mg twice daily, continue on discharge  Diabetes, poorly controlled  HgbA1c 9.5, goal < 7.0  Uncontrolled  Hyperglycemia  CBG monitoring  SSI  DM education and close PCP follow up  Sepsis due to cholecystitis  Status post percutaneous cholecystectomy  Blood culture negative  On antibiotics  Hypertension . Stable . Permissive hypertension (OK if <180/105) for 24-48 hours post stroke and then gradually to target within 3-5 days.  BP goal 130-150 given posterior vascular stenosis  Hyperlipidemia  Home meds: Lipitor 40  LDL 146 in 06/2018, goal < 70  Now on Lipitor 40  Continue statin at discharge  Other Stroke Risk Factors  Advanced age  Obesity, Body mass index is 41.73 kg/m.   Other Active Problems  Leukocytosis  Hypokalemia  Hospital day # 4  Neurology will sign off. Please call with questions. Pt will follow up with stroke clinic Dr. Pearlean Brownie at [facility] in about 4 weeks. Thanks for the consult.   Marvel Plan, MD PhD Stroke Neurology 07/19/2018 3:36 PM  I spent  35 minutes in total face-to-face time with the patient, more than 50% of which was spent in counseling and coordination of care, reviewing test results, images and medication, and discussing the diagnosis of recurrent stroke, questionable A. fib, history of DVT, bedbound status, sepsis cholecystitis, treatment plan and potential prognosis. This patient's care requiresreview of multiple  databases, neurological assessment, discussion with family, other specialists and medical decision making of high complexity.  I have discussed with Dr. Mahala Menghini over the phone.       To contact Stroke Continuity provider, please refer to [email or web address]. After hours, contact General Neurology

## 2018-07-19 NOTE — Progress Notes (Signed)
  Echocardiogram 2D Echocardiogram has been performed.  Tom Wang 07/19/2018, 2:48 PM

## 2018-07-19 NOTE — Progress Notes (Signed)
[facility] PROGRESS NOTE  Tom Wang WUJ:[id number] DOB: 07/02/1925 DOA: 07/15/2018 PCP: Tom Wang, Tom Wang   Narrative: 84 year old male Prior TIAs in age 84, history of encephalopathy, diabetes mellitus type 2, HTN, HLD, reflux, iron deficiency anemia, glaucoma, DVT status post chronic anticoagulation with Eliquis, CVA 2014 with residual left hemiparesis Recent admission 1113-11/16 with acute CVA left-sided Readmitted on 07/15/2018 with generalized abdominal pain found to have cholecystitis with large stone in gallbladder neck 4 cm General surgery consulted-not felt to be a surgical candidate On admission labs are relatively normal slightly elevated glucose, WBC 24, hemoglobin 11-CT of abdomen pelvis showed the above Hospitalization complicated by metabolic encephalopathy and increasing confusion ultimately MRI done found to have a stroke  A & Plan Stroke-CT of the head shows stable changes-MRI however showed scattered multiple small infarcts left PCA territory no expected hemorrhage or mass-effect-neurology saw patient await repeat echocardiogram if negative start on Plavix-at this time on 325 of aspirin--if there are suggestions of any vegetations on TTE we will ask for TEE-family understands the same Acute metabolic encephalopathy probably secondary to pain meds sedating medications--monitor-suspect that patient has metabolic encephalopathy also from sundowning slightly Cholecystitis treated non-surgically-currently on Invanz every 24 since 12/14-narrowed to Zosyn monotherapy would treat empirically for 10 to 14 days or as otherwise dictated Diabetes mellitus type 2 continue glipizide 10 twice daily from allegedly has "allergy" to insulin according to daughter and has strokelike symptoms-I will start metformin 850 as sugars not controlled HTN avoid fluctuations in blood pressure-continue amlodipine 10, PRN hydralazine 10 every 4 as needed-holding losartan 0.50 plan 12.5)  daily at this time Reflux continue pantoprazole 40 daily Hyperlipidemia continue Lipitor 40 daily BMI 41-         DVT prophylaxis: Lovenox code Status: Full family Communication: None disposition Plan: Inpatient   Tom Hallstrom, MD  [facility] Direct contact: [phone] --Via amion app OR  --[email or web address]; password [email or web address]  7PM-7AM contact night coverage as above 07/19/2018, 7:40 AM  LOS: 4 days   Consultants:  Neurology  General surgery  Procedures:  Echo is pending  Antimicrobials:  Invanz  Interval history/Subjective: Awake alert but not completely coherent although can tell me he is in the hospital cannot tell me if this is 2019 but is quite sleepy   Objective:  Vitals:  Vitals:   07/18/18 2207 07/19/18 0543  BP: (!) 128/52 (!) 168/73  Pulse: 76 68  Resp: 17 19  Temp: 98.2 F (36.8 C) 98.9 F (37.2 C)  SpO2: 93% 100%    Exam: EOMI NCAT in no distress however sleepy thick neck no pallor no icterus Chest is clear Upper extremities and lower extremities are slightly swollen Patient does not move his lower extremities on command although raises his left hand Reflexes are suboptimal 1/3 Sensory is grossly intact but patient is lethargic to some extent  I have personally reviewed the following:   Labs:  Potassium 3.3 BUN/creatinine 12/1.0  WBC 14  Imaging studies:  MRI brain confirms scattered small infarct left PCA without associated hemorrhage and expected evolution of November left thalamic and pontine infarct  Medical tests:  No  Test discussed with performing physician:   Decision to obtain old records:  No  Review and summation of old records:  No  Scheduled Meds: . acetaminophen  500 mg Oral Daily  . amLODipine  10 mg Oral Daily  . aspirin  325 mg Oral Daily  . atorvastatin  40 mg Oral q1800  . cholecalciferol  500 Units Oral QHS  .  enoxaparin (LOVENOX) injection  70 mg Subcutaneous Q24H  . glipiZIDE  10 mg Oral BID  AC  . lidocaine  1 patch Transdermal Q24H  . pantoprazole  40 mg Oral Daily  . senna-docusate  2 tablet Oral Daily  . sodium chloride flush  5 mL Intracatheter Q8H  . vitamin B-12  50 mcg Oral Daily   Continuous Infusions: . ertapenem 1,000 mg (07/18/18 2055)    Principal Problem:   Gall stone in bile duct with infection of gallbladder Active Problems:   Essential hypertension   Hyperlipidemia   DM type 2 (diabetes mellitus, type 2) (HCC)   H/O: CVA (cerebrovascular accident)   Chronic anticoagulation- for h/o LLE DVT, on Eliquis.   Somnolence   Nephrolithiasis   LOS: 4 days

## 2018-07-19 NOTE — Care Management Important Message (Signed)
Important Message  Patient Details  Name: Tom Wang MRN: [id number] Date of Birth: 03/02/1925   Medicare Important Message Given:  Yes    Keiosha Cancro Stefan ChurchBratton 07/19/2018, 4:16 PM

## 2018-07-19 NOTE — Final Consult Note (Signed)
Consultant Final Sign-Off Note    Assessment/Final recommendations  Tom Wang is a 84 y.o. male followed by me for Cholelithiasis-with positive HIDA scan IR percutaneous cholecystostomy 07/17/2018 - tolerating diet, no abdominal pain today   Wound care (if applicable):    Diet at discharge: per primary team   Activity at discharge: per primary team   Follow-up appointment:  Dr. Luisa Hartornett in 6 weeks   Pending results:  Unresulted Labs (From admission, onward)    Start     Ordered   07/22/18 0500  Creatinine, serum  (enoxaparin (LOVENOX)    CrCl < 30 ml/min)  Weekly,   R    Comments:  while on enoxaparin therapy.    07/15/18 1832   07/20/18 0500  Renal function panel  Tomorrow morning,   R     07/19/18 0852   07/20/18 0500  CBC with Differential/Platelet  Tomorrow morning,   R     07/19/18 0852   07/17/18 1618  Aerobic/Anaerobic Culture (surgical/deep wound)  Once,   R     07/17/18 1618           Medication recommendations:   Other recommendations:    Thank you for allowing us to participate in the care of your patient!  Please consult us again if you have further needs for your patient.  Jerre SimonJessica L Ambrose Wile 07/19/2018 9:54 AM    Subjective   CC: cholecystitis   No abdominal pain. Eating okay. Daughter at bedside.   Objective  Vital signs in last 24 hours: Temp:  [98.2 F (36.8 C)-98.9 F (37.2 C)] 98.9 F (37.2 C) (12/18 0543) Pulse Rate:  [68-76] 68 (12/18 0543) Resp:  [17-19] 19 (12/18 0543) BP: (124-168)/(47-73) 168/73 (12/18 0543) SpO2:  [91 %-100 %] 100 % (12/18 0543)  PE: General: alert, NAD GI: +BS, ND, NT, RUQ drain in place Skin: warm and dry   Pertinent labs and Studies: Recent Labs    07/17/18 0145 07/18/18 0830 07/19/18 0222  WBC 22.0* 14.1* 14.0*  HGB 11.6* 10.9* 11.5*  HCT 38.5* 35.8* 35.5*   BMET Recent Labs    07/18/18 0830 07/19/18 0222  NA 140 138  K 3.6 3.3*  CL 106 105  CO2 24 24  GLUCOSE 307* 270*  BUN 10  12  CREATININE 1.10 1.08  CALCIUM 8.6* 8.9   No results for input(s): LABURIN in the last 72 hours. Results for orders placed or performed during the hospital encounter of 07/15/18  Blood culture (routine x 2)     Status: None (Preliminary result)   Collection Time: 07/15/18  5:30 AM  Result Value Ref Range Status   Specimen Description BLOOD LEFT ARM  Final   Special Requests   Final    BOTTLES DRAWN AEROBIC ONLY Blood Culture adequate volume   Culture   Final    NO GROWTH 4 DAYS Performed at [facility], [address] [place], [place] [address]    Report Status PENDING  Incomplete  Blood culture (routine x 2)     Status: None (Preliminary result)   Collection Time: 07/15/18  5:45 AM  Result Value Ref Range Status   Specimen Description BLOOD LEFT HAND  Final   Special Requests   Final    BOTTLES DRAWN AEROBIC AND ANAEROBIC Blood Culture adequate volume   Culture   Final    NO GROWTH 4 DAYS Performed at [facility], [address] [place], [place] [address]    Report Status  PENDING  Incomplete  Urine culture     Status: None   Collection Time: 07/15/18 10:33 AM  Result Value Ref Range Status   Specimen Description URINE, RANDOM  Final   Special Requests NONE  Final   Culture   Final    NO GROWTH Performed at [facility], [address] [place], [place] [address]    Report Status 07/16/2018 FINAL  Final  Surgical PCR screen     Status: None   Collection Time: 07/16/18  4:23 AM  Result Value Ref Range Status   MRSA, PCR NEGATIVE NEGATIVE Final   Staphylococcus aureus NEGATIVE NEGATIVE Final    Comment: (NOTE) The Xpert SA Assay (FDA approved for NASAL specimens in patients 51 years of age and older), is one component of a comprehensive surveillance program. It is not intended to diagnose infection nor to guide or monitor treatment. Performed at [facility], [address] [place], [place] [address]   Aerobic/Anaerobic Culture  (surgical/deep wound)     Status: None (Preliminary result)   Collection Time: 07/17/18  5:01 PM  Result Value Ref Range Status   Specimen Description GALL BLADDER BILE  Final   Special Requests NONE  Final   Gram Stain   Final    ABUNDANT WBC PRESENT, PREDOMINANTLY PMN MODERATE GRAM NEGATIVE RODS RARE GRAM VARIABLE ROD    Culture   Final    MODERATE GRAM NEGATIVE RODS CULTURE REINCUBATED FOR BETTER GROWTH Performed at [facility], [address], [place], [place] [address]    Report Status PENDING  Incomplete    Imaging: Mr Brain Wo Contrast  Result Date: 07/18/2018 CLINICAL DATA:  84 year old male with recent altered mental status, unresponsiveness. EXAM: MRI HEAD WITHOUT CONTRAST TECHNIQUE: Multiplanar, multiecho pulse sequences of the brain and surrounding structures were obtained without intravenous contrast. COMPARISON:  Head CTs 0937 hours today and earlier. Brain MRI 06/15/2018. FINDINGS: Brain: There are new scattered small foci of restricted diffusion in the left occipital lobe, including about the atrium of the left lateral ventricle as seen on series 3, image 28. The left thalamic and small left pontine infarcts seen last month have evolved as expected. No associated acute hemorrhage or mass effect. No other acute ischemia. Chronic small vessel disease in both hemispheres with right side brainstem Wallerian degeneration redemonstrated. Superimposed chronic pontine small vessel disease. Occasional small microhemorrhages (including in the right dorsal pons on series 9, image 44. No midline shift, mass effect, evidence of mass lesion, ventriculomegaly, extra-axial collection or acute intracranial hemorrhage. Cervicomedullary junction and pituitary are within normal limits. Vascular: Major intracranial vascular flow voids are stable, the vertebrobasilar flow voids are abnormal corresponding to the severe posterior circulation stenosis demonstrated by CTA and MRA in August this  year. Skull and upper cervical spine: Negative visible cervical spine. Normal bone marrow signal. Sinuses/Orbits: Stable, negative. Other: Mastoids remain clear. Visible internal auditory structures appear normal. Scalp and face soft tissues appear negative. IMPRESSION: 1. Scattered small acute infarcts in the left PCA territory with no associated hemorrhage or mass effect. 2. Expected evolution of the November left thalamic and left pontine infarcts. 3. Underlying severe posterior circulation atherosclerosis and stenosis as seen by CTA and MRA and August. Electronically Signed   By: Odessa Fleming M.D.   On: 07/18/2018 16:29

## 2018-07-19 NOTE — Care Management Note (Addendum)
Case Management Note  Patient Details  Name: Tom Wang MRN: [id number] Date of Birth: 05-Aug-1924  Subjective/Objective:                    Action/Plan:  Patient asleep. Spoke with daughter Dorann LodgeJuanita at bedside and grand daughter Merry ProudBrandi by speak phone.   Discussed PT recommendations for home health and hoyer lift and PTAR transportation home. Family can provide 24 hour care at home.  Juanita  and Brandi in agreement with home health services they would like [facility]. Patient has a hospital bed at home already and a "stand up lift". Brandi would like NCM to order hoyer lift and have [facility] call her with any cost and delivery.   Both in agreement for PTAR transport home at time of discharge. Confirmed face sheet information.  [facility] and [facility]   Patient currently on oxygen. Patient does not have home oxygen. Explained to [place]andi and Juanita if patient is unable to wean off oxygen will ask MD for oxygen orders for home.   Will need home health orders and face to face. Will continue to follow.  [facility]oyer lift [facility], he will call Brandie. Expected Discharge Date:                  Expected Discharge Plan:  Home w Home Health Services  In-House Referral:     Discharge planning Services  CM Consult  Post Acute Care Choice:  Home Health, Durable Medical Equipment Choice offered to:  Adult Children  DME Arranged:  Other see comment DME Agency:  [facility]  HH Arranged:  RN, PT, Disease Management, OT, Nurse's Aide HH Agency:   [facility]   Status of Service:  In process, will continue to follow  If discussed at Long Length of Stay Meetings, dates discussed:    Additional Comments:  Kingsley PlanWile, Axelle Szwed Marie, RN 07/19/2018, 11:59 AM

## 2018-07-20 LAB — CULTURE, BLOOD (ROUTINE X 2)
Culture: NO GROWTH
Culture: NO GROWTH
Special Requests: ADEQUATE
Special Requests: ADEQUATE

## 2018-07-20 LAB — RENAL FUNCTION PANEL
Albumin: 2.5 g/dL — ABNORMAL LOW (ref 3.5–5.0)
Anion gap: 9 (ref 5–15)
BUN: 8 mg/dL (ref 8–23)
CO2: 26 mmol/L (ref 22–32)
Calcium: 9.2 mg/dL (ref 8.9–10.3)
Chloride: 105 mmol/L (ref 98–111)
Creatinine, Ser: 0.84 mg/dL (ref 0.61–1.24)
GFR calc Af Amer: >60 mL/min (ref >60)
GFR calc non Af Amer: >60 mL/min (ref >60)
Glucose, Bld: 252 mg/dL — ABNORMAL HIGH (ref 70–99)
POTASSIUM: 3.3 mmol/L — AB (ref 3.5–5.1)
Phosphorus: 2.3 mg/dL — ABNORMAL LOW (ref 2.5–4.6)
Sodium: 140 mmol/L (ref 135–145)

## 2018-07-20 LAB — CBC WITH DIFFERENTIAL/PLATELET
Band Neutrophils: 0 %
Basophils Absolute: 0 10*3/uL (ref 0.0–0.1)
Basophils Relative: 0 %
Blasts: 0 %
Eosinophils Absolute: 0.1 10*3/uL (ref 0.0–0.5)
Eosinophils Relative: 1 %
HCT: 37.6 % — ABNORMAL LOW (ref 39.0–52.0)
Hemoglobin: 12.3 g/dL — ABNORMAL LOW (ref 13.0–17.0)
LYMPHS PCT: 9 %
Lymphs Abs: 1.3 10*3/uL (ref 0.7–4.0)
MCH: 25.9 pg — ABNORMAL LOW (ref 26.0–34.0)
MCHC: 32.7 g/dL (ref 30.0–36.0)
MCV: 79.3 fL — AB (ref 80.0–100.0)
Metamyelocytes Relative: 0 %
Monocytes Absolute: 2 10*3/uL — ABNORMAL HIGH (ref 0.1–1.0)
Monocytes Relative: 14 %
Myelocytes: 0 %
Neutro Abs: 10.6 10*3/uL — ABNORMAL HIGH (ref 1.7–7.7)
Neutrophils Relative %: 76 %
Other: 0 %
Platelets: 191 10*3/uL (ref 150–400)
Promyelocytes Relative: 0 %
RBC: 4.74 MIL/uL (ref 4.22–5.81)
RDW: 15.1 % (ref 11.5–15.5)
WBC: 14 10*3/uL — ABNORMAL HIGH (ref 4.0–10.5)
nRBC: 0 % (ref 0.0–0.2)
nRBC: 0 /100 WBC

## 2018-07-20 LAB — GLUCOSE, CAPILLARY
GLUCOSE-CAPILLARY: 200 mg/dL — AB (ref 70–99)
Glucose-Capillary: 208 mg/dL — ABNORMAL HIGH (ref 70–99)
Glucose-Capillary: 228 mg/dL — ABNORMAL HIGH (ref 70–99)
Glucose-Capillary: 246 mg/dL — ABNORMAL HIGH (ref 70–99)

## 2018-07-20 LAB — AMMONIA: Ammonia: 25 umol/L (ref 9–35)

## 2018-07-20 MED ORDER — METRONIDAZOLE 500 MG PO TABS
500.0000 mg | ORAL_TABLET | Freq: Three times a day (TID) | ORAL | Status: DC
  Administered 2018-07-20: 500 mg via ORAL
  Filled 2018-07-20 (×2): qty 1

## 2018-07-20 MED ORDER — METOPROLOL TARTRATE 25 MG PO TABS
25.0000 mg | ORAL_TABLET | Freq: Two times a day (BID) | ORAL | Status: DC
  Administered 2018-07-20: 25 mg via ORAL
  Filled 2018-07-20 (×3): qty 1

## 2018-07-20 MED ORDER — CLINDAMYCIN HCL 300 MG PO CAPS: 300.0000 mg | ORAL_CAPSULE | Freq: Three times a day (TID) | ORAL | Status: DC

## 2018-07-20 MED ORDER — SULFAMETHOXAZOLE-TRIMETHOPRIM 800-160 MG PO TABS
1.0000 | ORAL_TABLET | Freq: Two times a day (BID) | ORAL | Status: DC
  Administered 2018-07-20: 1 via ORAL
  Filled 2018-07-20 (×2): qty 1

## 2018-07-20 MED ORDER — SORBITOL 70 % SOLN
20.0000 mL | Freq: Every day | Status: DC
  Administered 2018-07-20 – 2018-07-25 (×2): 20 mL via ORAL
  Filled 2018-07-20 (×10): qty 30

## 2018-07-20 NOTE — Progress Notes (Signed)
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 Recent admission 1113-11/16 with acute CVA left-sided Readmitted on 1Jul 29, 202019 with generalized abdominal pain found to have cholecystitis with large stone in gallbladder neck 4 cm General surgery consulted-not felt to be a surgical candidate On admission labs are relatively normal slightly elevated glucose, WBC 24, hemoglobin 11-CT of abdomen pelvis showed the above Hospitalization complicated by metabolic encephalopathy and increasing confusion ultimately MRI done found to have a stroke  A & Plan Stroke-CT of the head shows stable changes-MRI= scattered multiple small infarcts left PCA territory no expected hemorrhage or mass-effect-neurology saw patient await repeat echocardiogram--d/w Dr. Roda ShuttersXu that Eliquis probably is the safest option going forward  Acute metabolic encephalopathy-? 2/42metabolic encephalopathy also from sundowning--ammonia is negatvie-I had a long conversation with daugther about infection and stroke likely being causes for confusion--I have mentioned we might need to consider his body "failing him" and that we would need to have more in depth discussions about options that might remain if this is the case  Cholecystitis treated non-surgically-currently on Invanz every 24 since 12/14-narrowed to bactrim and flagyll oral therapy-expect duration probably for a total of 2-3 weeks Drain to be managed as OP by IR  Diabetes mellitus type 2 continue glipizide 10 twice daily from allegedly has "allergy" to insulin according to daughter and has strokelike symptoms-continue metformin 850 as sugars not  controlled  HTN avoid fluctuations in blood pressure- PRN hydralazine 10 every 4 as needed-holding losartan 0.50 plan 12.5) daily at this time Cont Metoprolol xl 25 started on 12/19  Reflux continue pantoprazole 40 daily  Hyperlipidemia continue Lipitor 40 daily BMI 41-    DVT prophylaxis: Lovenox code Status: Full family Communication: None disposition Plan: Inpatient   Tom Kriegel, MD  [facility] Direct contact: [phone] --Via amion app OR  --[email or web address]; password [email or web address]  7PM-7AM contact night coverage as above 07/20/2018, 1:11 PM  LOS: 5 days   Consultants:  Neurology  General surgery  Procedures:  Echo is pending  Antimicrobials:  Invanz  Interval history/Subjective:  Less interactive Daughter states was more awake earlier and talking and ate a couple of bites She has a lot of concerns about consitpation causing confusion, about hydralazine causing elevated HR etc all of which are not grounded in reality We discontinued Amlodipine yesterday as she felt that this was causing confusion  Objective:  Vitals:  Vitals:   07/20/18 0600 07/20/18 0822  BP: (!) 172/75 (!) 168/71  Pulse: 89 82  Resp: 18   Temp: 98.1 F (36.7 C) 99.5 F (37.5 C)  SpO2: 99% 96%    Exam: sleepy thick neck no pallor no icterus Chest is clear Upper extremities and lower extremities are slightly swollen Reflexes are suboptimal 1/3 abd soft with drain in place Sensory is grossly intact but patient is lethargic to some extent  I have personally reviewed the following:   Labs:  Potassium 3.3 BUN/creatinine 12/1.0  WBC 14  Ammonia 25  Imaging studies:  MRI brain confirms scattered small infarct left PCA without associated hemorrhage and expected evolution of November left thalamic and pontine infarct  Medical tests:  No  Test discussed with performing physician:   Decision to obtain old records:  No  Review and summation of  old  records:  No  Scheduled Meds: . acetaminophen  500 mg Oral Daily  . apixaban  5 mg Oral BID  . atorvastatin  40 mg Oral q1800  . cholecalciferol  500 Units Oral QHS  . feeding supplement (GLUCERNA SHAKE)  237 mL Oral TID BM  . glipiZIDE  10 mg Oral BID AC  . lidocaine  1 patch Transdermal Q24H  . metFORMIN  850 mg Oral Q breakfast  . metoprolol tartrate  25 mg Oral BID  . metroNIDAZOLE  500 mg Oral Q8H  . multivitamin with minerals  1 tablet Oral Daily  . pantoprazole  40 mg Oral Daily  . polyethylene glycol  17 g Oral Daily  . senna-docusate  2 tablet Oral Daily  . sodium chloride flush  5 mL Intracatheter Q8H  . sorbitol  20 mL Oral Daily  . sulfamethoxazole-trimethoprim  1 tablet Oral Q12H  . vitamin B-12  50 mcg Oral Daily   Continuous Infusions:   Principal Problem:   Gall stone in bile duct with infection of gallbladder Active Problems:   Essential hypertension   Hyperlipidemia   DM type 2 (diabetes mellitus, type 2) (HCC)   H/O: CVA (cerebrovascular accident)   Chronic anticoagulation- for h/o LLE DVT, on Eliquis.   Somnolence   Nephrolithiasis   LOS: 5 days

## 2018-07-20 NOTE — Plan of Care (Signed)
  Problem: Clinical Measurements: Goal: Will remain free from infection Outcome: Progressing Goal: Diagnostic test results will improve Outcome: Progressing   Problem: Activity: Goal: Risk for activity intolerance will decrease Outcome: Progressing   Problem: Nutrition: Goal: Adequate nutrition will be maintained Outcome: Progressing   Problem: Elimination: Goal: Will not experience complications related to bowel motility Outcome: Progressing Goal: Will not experience complications related to urinary retention Outcome: Progressing   Problem: Pain Managment: Goal: General experience of comfort will improve Outcome: Progressing   Problem: Safety: Goal: Ability to remain free from injury will improve Outcome: Progressing   Problem: Skin Integrity: Goal: Risk for impaired skin integrity will decrease Outcome: Progressing

## 2018-07-20 NOTE — Progress Notes (Signed)
Referring Physician(s): Cornett,T  Supervising Physician: Richarda Overlie  Patient Status:  [facility] - In-pt  Chief Complaint:  cholecystitis  Subjective:  Pt asleep and still with lethargy; wife in room along with Dr. Mahala Menghini discussing care; no reports of increasing abd pain  Allergies: Morphine and related; Other; Gabapentin; Insulins; Lantus [insulin glargine]; and Penicillins  Medications: Prior to Admission medications   Medication Sig Start Date End Date Taking? Authorizing Provider  acetaminophen (TYLENOL) 500 MG tablet Take 500 mg by mouth daily.    Yes [provider]  Ascorbic Acid (VITAMIN C PO) Take 1 tablet by mouth daily.   Yes [provider]  aspirin 325 MG tablet Take 1 tablet (325 mg total) by mouth daily. 06/17/18  Yes Mikhail, Nita Sells, DO  atorvastatin (LIPITOR) 40 MG tablet Take 1 tablet (40 mg total) by mouth daily at 6 PM. 06/17/18  Yes Mikhail, [place], DO  cholecalciferol (VITAMIN D) 1000 UNITS tablet Take 500 Units by mouth at bedtime.    Yes [provider]  Cyanocobalamin (VITAMIN B-12 PO) Take 1 tablet by mouth daily.   Yes [provider]  glipiZIDE (GLUCOTROL) 10 MG tablet Take 10 mg by mouth 2 (two) times daily before a meal.   Yes [provider]  losartan (COZAAR) 25 MG tablet Take 0.5 tablets (12.5 mg total) by mouth daily. 06/22/18 07/22/18 Yes Mikhail, [place], DO  Misc Natural Products (TURMERIC CURCUMIN) CAPS Take 1 capsule by mouth daily.    Yes [provider]  omeprazole (PRILOSEC) 20 MG capsule Take 20 mg by mouth daily.   Yes [provider]  senna-docusate (SENOKOT-S) 8.6-50 MG tablet Take 2 tablets by mouth daily.   Yes [provider]     Vital Signs: BP (!) 168/71 (BP Location: Right Arm)   Pulse 82   Temp 99.5 F (37.5 C) (Axillary)   Resp 18   Ht 6\' 2"  (1.88 m)   Wt (!) 325 lb (147.4 kg)   SpO2 96%   BMI 41.73 kg/m   Physical Exam GB drain intact,  output 230 cc blood-tinged fluid; drain flushed without difficulty; abd soft, site NT  Imaging: Ct Head Wo Contrast  Result Date: 07/18/2018 CLINICAL DATA:  84 year old who is unresponsive to stimuli, an acute change in mental status. EXAM: CT HEAD WITHOUT CONTRAST TECHNIQUE: Contiguous axial images were obtained from the base of the skull through the vertex without intravenous contrast. COMPARISON:  CT head 1Aug 22, 202019 and earlier. MRI brain 06/15/2018 and earlier. FINDINGS: Brain: Moderate to severe age related cortical atrophy, mild deep atrophy and mild cerebellar atrophy, unchanged. Severe changes of small vessel disease of the white matter diffusely including the pons and brainstem, unchanged. No mass lesion. No midline shift. No acute hemorrhage or hematoma. No extra-axial fluid collections. No evidence of acute infarction. Vascular: Moderate BILATERAL carotid siphon and vertebral artery atherosclerosis. No hyperdense vessel. Skull: No skull fracture or other focal osseous abnormality involving the skull. Sinuses/Orbits: Small mucous retention cyst or polyp involving the LEFT maxillary sinus. Minimal mucosal thickening involving the base of the frontal sinuses. Visualized paranasal sinuses otherwise well aerated. BILATERAL mastoid air cells and BILATERAL middle ear cavities well-aerated. Asymmetric hyperdense lens in the LEFT eye as noted previously. Orbits and globes otherwise unremarkable. Other: None. IMPRESSION: 1. No acute intracranial abnormality. 2. Stable moderate to severe age related generalized atrophy and severe chronic microvascular ischemic changes of the white matter. Electronically Signed   By: Hulan Saas M.D.   On:  07/18/2018 10:02   Mr Brain Wo Contrast  Result Date: 07/18/2018 CLINICAL DATA:  84 year old male with recent altered mental status, unresponsiveness. EXAM: MRI HEAD WITHOUT CONTRAST TECHNIQUE: Multiplanar, multiecho pulse sequences of the brain and surrounding  structures were obtained without intravenous contrast. COMPARISON:  Head CTs 0937 hours today and earlier. Brain MRI 06/15/2018. FINDINGS: Brain: There are new scattered small foci of restricted diffusion in the left occipital lobe, including about the atrium of the left lateral ventricle as seen on series 3, image 28. The left thalamic and small left pontine infarcts seen last month have evolved as expected. No associated acute hemorrhage or mass effect. No other acute ischemia. Chronic small vessel disease in both hemispheres with right side brainstem Wallerian degeneration redemonstrated. Superimposed chronic pontine small vessel disease. Occasional small microhemorrhages (including in the right dorsal pons on series 9, image 44. No midline shift, mass effect, evidence of mass lesion, ventriculomegaly, extra-axial collection or acute intracranial hemorrhage. Cervicomedullary junction and pituitary are within normal limits. Vascular: Major intracranial vascular flow voids are stable, the vertebrobasilar flow voids are abnormal corresponding to the severe posterior circulation stenosis demonstrated by CTA and MRA in August this year. Skull and upper cervical spine: Negative visible cervical spine. Normal bone marrow signal. Sinuses/Orbits: Stable, negative. Other: Mastoids remain clear. Visible internal auditory structures appear normal. Scalp and face soft tissues appear negative. IMPRESSION: 1. Scattered small acute infarcts in the left PCA territory with no associated hemorrhage or mass effect. 2. Expected evolution of the November left thalamic and left pontine infarcts. 3. Underlying severe posterior circulation atherosclerosis and stenosis as seen by CTA and MRA and August. Electronically Signed   By: Odessa FlemingH  Hall M.D.   On: 07/18/2018 16:29   Nm Hepatobiliary Liver Func  Result Date: 07/16/2018 CLINICAL DATA:  Abnormal CT scan.  Abdominal pain. EXAM: NUCLEAR MEDICINE HEPATOBILIARY IMAGING TECHNIQUE:  Sequential images of the abdomen were obtained out to 60 minutes following intravenous administration of radiopharmaceutical. RADIOPHARMACEUTICALS:  5.3 mCi Tc-1739m  Choletec IV COMPARISON:  None. FINDINGS: Prompt uptake and biliary excretion of activity by the liver is seen. The gallbladder does not fill during 2 hours of imaging. Biliary activity passes into small bowel, consistent with patent common bile duct. IMPRESSION: The gallbladder does not fill during 2 hours of imaging. Given the CT findings, findings are most consistent with acute cholecystitis. Lack of gallbladder filling can also be seen in the setting of chronic cholecystitis, recent meal, prolonged fasting, or severe acute illness. Electronically Signed   By: Gerome Samavid  Williams III M.D   On: 07/16/2018 12:02   Ir Perc Cholecystostomy  Result Date: 07/17/2018 INDICATION: Acute cholecystitis EXAM: CHOLECYSTOSTOMY MEDICATIONS: Vancomycin 1 g ANESTHESIA/SEDATION: Fentanyl 75 mcg IV; Versed 1 mg IV Moderate Sedation Time:  13 minutes The patient was continuously monitored during the procedure by the interventional radiology nurse under my direct supervision. FLUOROSCOPY TIME:  Fluoroscopy Time: 1 minutes 18 seconds (10 mGy). COMPLICATIONS: None immediate. PROCEDURE: Informed written consent was obtained from the patient after a thorough discussion of the procedural risks, benefits and alternatives. All questions were addressed. Maximal Sterile Barrier Technique was utilized including caps, mask, sterile gowns, sterile gloves, sterile drape, hand hygiene and skin antiseptic. A timeout was performed prior to the initiation of the procedure. The right upper quadrant was prepped with ChloraPrep in a sterile fashion, and a sterile drape was applied covering the operative field. A sterile gown and sterile gloves were used for the procedure. A 21 gauge needle was  inserted into the gallbladder lumen under sonographic guidance and via transhepatic approach. It  was removed over a 018 wire, which was upsized to a 3-J. A 10-French drain was advanced over the wire and coiled in the gallbladder lumen. It was sewn to the skin after being string fixed. FINDINGS: Imaging demonstrates placement of a 10 French drain into the lumen of the gallbladder. Contrast fills the gallbladder. A gallstone is noted. The cystic duct is occluded. IMPRESSION: Successful cholecystostomy. This needs to remain in place at least 6 weeks. Electronically Signed   By: Jolaine ClickArthur  Hoss M.D.   On: 07/17/2018 16:30    Labs:  CBC: Recent Labs    07/17/18 0145 07/18/18 0830 07/19/18 0222 07/20/18 0336  WBC 22.0* 14.1* 14.0* 14.0*  HGB 11.6* 10.9* 11.5* 12.3*  HCT 38.5* 35.8* 35.5* 37.6*  PLT 176 191 169 191    COAGS: Recent Labs    03/23/18 0317 06/14/18 1648 07/17/18 1415  INR 1.11 1.00 1.06  APTT 38* 38*  --     BMP: Recent Labs    07/17/18 0145 07/18/18 0830 07/19/18 0222 07/20/18 0336  NA 138 140 138 140  K 3.5 3.6 3.3* 3.3*  CL 104 106 105 105  CO2 26 24 24 26   GLUCOSE 283* 307* 270* 252*  BUN 8 10 12 8   CALCIUM 9.0 8.6* 8.9 9.2  CREATININE 0.96 1.10 1.08 0.84  GFRNONAA >60 59* >60 >60  GFRAA >60 >60 >60 >60    LIVER FUNCTION TESTS: Recent Labs    06/14/18 1648 07/15/18 0528 07/17/18 0145 07/19/18 0222 07/20/18 0336  BILITOT 0.5 0.9 0.7 0.7  --   AST 15 15 15 18   --   ALT 13 10 12 17   --   ALKPHOS 59 63 70 67  --   PROT 7.1 7.5 6.8 6.0*  --   ALBUMIN 3.4* 3.5 2.7* 2.4* 2.5*    Assessment and Plan: Pt with hx acute cholecystitis; s/p perc GB drain 12/16; temp 99.5, WBC 14(14), hgb 12.3, K 3.3, creat nl; bile cx pend; cont drain irrigation q shift while inhouse, once daily as OP, record output and change dressing every 1-2 days; [facility]; call [phone] or [phone] with any drain questions   Electronically Signed: D. Jeananne RamaKevin Saragrace Selke, PA-C 07/20/2018, 10:26 AM   I spent a total of 15  minutes at the the patient's bedside AND on the patient's hospital floor or unit, greater than 50% of which was counseling/coordinating care for gallbladder drain    Patient ID: Tom GessLawrence Tidwell, male   DOB: 12/17/1924, 84 y.o.   MRN: [id number]

## 2018-07-20 NOTE — Progress Notes (Addendum)
Day shift passed that patient was difficult to wake all day, provider aware.  Went to give 2200 meds, unable to give patient po medications. Responds to pain, will not open eyes on command.  Called RR they are aware of patient but on the way to a code. Will call back.   2215 family now told me patient started drooling mid afternoon, out of ordinary for him.  He was still swallowing liquids per granddaughter this evening 1800.  Paged provider, will order STAT CT.  2300 Spoke w/ Puja RR, discussed history and new orders. Will check puils and gag reflex.  2310 relayed lack of pupillary response with Puja and some gag response.  0145 bladder scan 700 mL; paged provider.  In & out cath ordered and performed.

## 2018-07-20 NOTE — Progress Notes (Signed)
Physical Therapy Treatment Patient Details Name: Tom Wang [place] MRN: [id number] DOB: 1925-05-04 Today's Date: 07/20/2018    History of Present Illness Tom Wang Lage is a 84 y.o. male with medical history significant for diabetes mellitus recent stroke hypertension GERD, who was brought by ambulance from home because of sudden onset of nausea and vomiting around lunchtime the day prior to admission, and family reported patient had been less active than usual seem more confused and tired and sleeping a lot.Gall stone in bile duct with infection of gallbladder. General surgery deemed Not optimal operative candidate. Now s/p percutaneous cholecystostomy on 12/16    PT Comments    Pt minimally responsive to therapy today, requiring total assist for placement of lift pad and use of Maximove to transfer pt to recliner. Pt family present and reports that they are hoping to get hoyer lift for home use. Family educated in pad placement and lift use. Concur with prior PT recommendation for palliative care consult before d/c'ing home. PT will continue to follow acutely.     Follow Up Recommendations  Home health PT;Supervision/Assistance - 24 hour;Other (comment)(Consider HHOT and RN as well)     Equipment Recommendations  Other (comment)(Hoyer lift, Ambulance transport home)       Precautions / Restrictions Precautions Precautions: Fall Restrictions Weight Bearing Restrictions: No    Mobility  Bed Mobility Overal bed mobility: Needs Assistance Bed Mobility: Rolling;Sidelying to Sit Rolling: +2 for physical assistance;Total assist         General bed mobility comments: Total assist for all aspects of bed mobility  Transfers Overall transfer level: Needs assistance               General transfer comment: Total assist of 2 using Maximove for bed to chair transfer; pad was placed in supine           Cognition Arousal/Alertness: Lethargic Behavior During Therapy: Flat  affect Overall Cognitive Status: Impaired/Different from baseline                                 General Comments: Eyes closed majority of session; he did open eyes briefly about 3-5 times, did not respond to any questioning from theapy or family         General Comments General comments (skin integrity, edema, etc.): Pt on 2L O2 via [place] and SaO2 remained >95%O2 throughout session, Family present and educate on pad placement and use of Maximove      Pertinent Vitals/Pain Pain Assessment: Faces Faces Pain Scale: Hurts a little bit Pain Location: grimacing and groaning with PROM during transfer Pain Descriptors / Indicators: Moaning;Grimacing Pain Intervention(s): Limited activity within patient's tolerance;Monitored during session;Repositioned           PT Goals (current goals can now be found in the care plan section) Acute Rehab PT Goals Patient Stated Goal: Did not state; Family very much wants to bring him home PT Goal Formulation: With family Time For Goal Achievement: 07/30/18 Potential to Achieve Goals: Fair    Frequency    Min 3X/week      PT Plan Current plan remains appropriate;Equipment recommendations need to be updated       AM-PAC PT "6 Clicks" Mobility   Outcome Measure  Help needed turning from your back to your side while in a flat bed without using bedrails?: Total Help needed moving from lying on your back to sitting on the side of a flat  bed without using bedrails?: Total Help needed moving to and from a bed to a chair (including a wheelchair)?: Total Help needed standing up from a chair using your arms (e.g., wheelchair or bedside chair)?: Total Help needed to walk in hospital room?: Total Help needed climbing 3-5 steps with a railing? : Total 6 Click Score: 6    End of Session Equipment Utilized During Treatment: Other (comment)(Maximove) Activity Tolerance: Patient tolerated treatment well Patient left: in chair;with call  bell/phone within reach;with family/visitor present Nurse Communication: Mobility status;Need for lift equipment PT Visit Diagnosis: Unsteadiness on feet (R26.81);Other abnormalities of gait and mobility (R26.89);Muscle weakness (generalized) (M62.81);Other symptoms and signs involving the nervous system (R29.898);Hemiplegia and hemiparesis Hemiplegia - Right/Left: Right Hemiplegia - dominant/non-dominant: Dominant Hemiplegia - caused by: Cerebral infarction     Time: 1139-1209 PT Time Calculation (min) (ACUTE ONLY): 30 min  Charges:  $Therapeutic Activity: 23-37 mins                     Garielle Mroz B. Beverely RisenVan Fleet PT, DPT Acute Rehabilitation Services Pager [phone] Office [phone]    Elon Alaslizabeth B Van Fleet 07/20/2018, 1:52 PM

## 2018-07-21 ENCOUNTER — Inpatient Hospital Stay (HOSPITAL_COMMUNITY): Payer: Medicare HMO

## 2018-07-21 DIAGNOSIS — Z86718 Personal history of other venous thrombosis and embolism: Secondary | ICD-10-CM

## 2018-07-21 DIAGNOSIS — K819 Cholecystitis, unspecified: Secondary | ICD-10-CM

## 2018-07-21 LAB — COMPREHENSIVE METABOLIC PANEL
ALT: 23 U/L (ref 0–44)
AST: 30 U/L (ref 15–41)
Albumin: 2.4 g/dL — ABNORMAL LOW (ref 3.5–5.0)
Alkaline Phosphatase: 79 U/L (ref 38–126)
Anion gap: 14 (ref 5–15)
BUN: 11 mg/dL (ref 8–23)
CO2: 22 mmol/L (ref 22–32)
Calcium: 9 mg/dL (ref 8.9–10.3)
Chloride: 106 mmol/L (ref 98–111)
Creatinine, Ser: 1.12 mg/dL (ref 0.61–1.24)
Glucose, Bld: 249 mg/dL — ABNORMAL HIGH (ref 70–99)
Potassium: 3.9 mmol/L (ref 3.5–5.1)
Sodium: 142 mmol/L (ref 135–145)
Total Bilirubin: 1.1 mg/dL (ref 0.3–1.2)
Total Protein: 6.3 g/dL — ABNORMAL LOW (ref 6.5–8.1)

## 2018-07-21 LAB — CBC WITH DIFFERENTIAL/PLATELET
Abs Immature Granulocytes: 0.06 10*3/uL (ref 0.00–0.07)
Basophils Absolute: 0.1 10*3/uL (ref 0.0–0.1)
Basophils Relative: 0 %
Eosinophils Absolute: 0.2 10*3/uL (ref 0.0–0.5)
Eosinophils Relative: 1 %
HCT: 38.3 % — ABNORMAL LOW (ref 39.0–52.0)
Hemoglobin: 12.3 g/dL — ABNORMAL LOW (ref 13.0–17.0)
IMMATURE GRANULOCYTES: 1 %
LYMPHS PCT: 11 %
Lymphs Abs: 1.5 10*3/uL (ref 0.7–4.0)
MCH: 25.8 pg — ABNORMAL LOW (ref 26.0–34.0)
MCHC: 32.1 g/dL (ref 30.0–36.0)
MCV: 80.5 fL (ref 80.0–100.0)
Monocytes Absolute: 2.6 10*3/uL — ABNORMAL HIGH (ref 0.1–1.0)
Monocytes Relative: 19 %
Neutro Abs: 9 10*3/uL — ABNORMAL HIGH (ref 1.7–7.7)
Neutrophils Relative %: 68 %
Platelets: 204 10*3/uL (ref 150–400)
RBC: 4.76 MIL/uL (ref 4.22–5.81)
RDW: 15.2 % (ref 11.5–15.5)
WBC: 13.3 10*3/uL — ABNORMAL HIGH (ref 4.0–10.5)
nRBC: 0 % (ref 0.0–0.2)

## 2018-07-21 LAB — GLUCOSE, CAPILLARY
Glucose-Capillary: 200 mg/dL — ABNORMAL HIGH (ref 70–99)
Glucose-Capillary: 230 mg/dL — ABNORMAL HIGH (ref 70–99)
Glucose-Capillary: 224 mg/dL — ABNORMAL HIGH (ref 70–99)
Glucose-Capillary: 206 mg/dL — ABNORMAL HIGH (ref 70–99)

## 2018-07-21 LAB — MAGNESIUM: Magnesium: 2.1 mg/dL (ref 1.7–2.4)

## 2018-07-21 MED ORDER — METOPROLOL TARTRATE 5 MG/5ML IV SOLN
5.0000 mg | Freq: Four times a day (QID) | INTRAVENOUS | Status: DC
  Administered 2018-07-21 – 2018-07-26 (×19): 5 mg via INTRAVENOUS
  Filled 2018-07-21 (×21): qty 5

## 2018-07-21 MED ORDER — ORAL CARE MOUTH RINSE
15.0000 mL | Freq: Two times a day (BID) | OROMUCOSAL | Status: DC
  Administered 2018-07-21 – 2018-07-24 (×8): 15 mL via OROMUCOSAL

## 2018-07-21 MED ORDER — SORBITOL 70 % SOLN
960.0000 mL | TOPICAL_OIL | Freq: Once | ORAL | Status: AC
  Administered 2018-07-21: 960 mL via RECTAL
  Filled 2018-07-21: qty 473

## 2018-07-21 MED ORDER — METRONIDAZOLE IN NACL 5-0.79 MG/ML-% IV SOLN
500.0000 mg | Freq: Three times a day (TID) | INTRAVENOUS | Status: DC
  Administered 2018-07-21 – 2018-07-26 (×16): 500 mg via INTRAVENOUS
  Filled 2018-07-21 (×16): qty 100

## 2018-07-21 MED ORDER — SODIUM CHLORIDE 0.9 % IV SOLN
2.0000 g | INTRAVENOUS | Status: DC
  Administered 2018-07-21 – 2018-07-26 (×6): 2 g via INTRAVENOUS
  Filled 2018-07-21 (×6): qty 20

## 2018-07-21 MED ORDER — INSULIN ASPART 100 UNIT/ML ~~LOC~~ SOLN: 0.0000 [IU] | Freq: Three times a day (TID) | SUBCUTANEOUS | Status: DC

## 2018-07-21 NOTE — Progress Notes (Signed)
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 Recent admission 1113-11/16 with acute CVA left-sided Readmitted on 110/03/2018 with generalized abdominal pain found to have cholecystitis with large stone in gallbladder neck 4 cm General surgery consulted-not felt to be a surgical candidate On admission labs are relatively normal slightly elevated glucose, WBC 24, hemoglobin 11-CT of abdomen pelvis showed the above Hospitalization complicated by metabolic encephalopathy and increasing confusion ultimately MRI done found to have a stroke  A & Plan Stroke-CT of the head shows stable changes-MRI= scattered multiple small infarcts left PCA territory no expected hemorrhage or mass-effect-neurology saw patient await repeat echocardiogram--d/w Dr. Roda ShuttersXu that Eliquis probably is the safest option going forward  Acute metabolic encephalopathy-? 2/542metabolic encephalopathy also from sundowning--ammonia is negative I have had repeated discussions with family that this may be secondary to sundowning and multiple insults including CVA, infection and now possible pneumonia-they are not willing to accept that patient is frail and may be declining and they are not ready for palliative discussions at this time and insist on full CODE STATUS  Possible aspiration-holding p.o. meds at this time changing all to IV-we will also add ceftriaxone and Flagyl and covering see if mentation improves I suspect it will not Repeat x-ray in a.m. along with labs  Constipation-give smog enema and see results  Cholecystitis treated non-surgically-currently  Draon IV antibiotics againin to be managed as OP  by IR  Diabetes mellitus type 2 continue glipizide 10 continue metformin 850 if more awake however family may not want insulin as sugars not controlled  HTN avoid fluctuations in blood pressure-changed to IV metoprolol 5 mg every 6 as needed and held metoprolol 25 daily  Reflux hold meds  Hyperlipidemia hold meds   DVT prophylaxis: Lovenox code Status: Full family Communication: None disposition Plan: Inpatient   Yousif Edelson, MD  [facility] Direct contact: [phone] --Via amion app OR  --[email or web address]; password [email or web address]  7PM-7AM contact night coverage as above 07/21/2018, 8:31 AM  LOS: 6 days   Consultants:  Neurology  General surgery  Procedures:  Echo is pending  Antimicrobials:  Invanz  Interval history/Subjective:  Slightly more interactive-no new other issues Seems about the same as pror Had to have I/o urine Family concerned about constipation causing confusion and also renal insufficiency   Objective:  Vitals:  Vitals:   07/21/18 0101 07/21/18 0654  BP: (!) 168/69 (!) 189/71  Pulse: 73 75  Resp:  18  Temp: 99.1 F (37.3 C) 98.9 F (37.2 C)  SpO2: 100% 100%    Exam: sleepy but arouses to some degree thick neck no pallor no icterus Chest is clear Swelling to upper and lower extremities Abdomen soft drain in place Lower extremity mild edema   I have personally reviewed the following:   Labs:  Potassium 3.3 BUN/creatinine 12/1.0  WBC 14  Ammonia 25  Imaging studies:  MRI brain confirms scattered small infarct left PCA without associated hemorrhage and expected evolution of November left thalamic and pontine infarct  Medical tests:  + 2 liters  Test discussed with performing physician:   Decision to obtain old records:  No  Review and summation of old records:  No  Scheduled Meds: . apixaban  5 mg Oral BID  .  insulin aspart  0-9 Units Subcutaneous TID WC  . lidocaine  1 patch Transdermal Q24H  . mouth rinse  15 mL  Mouth Rinse q12n4p  . metoprolol tartrate  5 mg Intravenous Q6H  . sodium chloride flush  5 mL Intracatheter Q8H  . sorbitol  20 mL Oral Daily  . sorbitol, milk of mag, mineral oil, glycerin (SMOG) enema  960 mL Rectal Once   Continuous Infusions: . cefTRIAXone (ROCEPHIN)  IV    . metronidazole      Principal Problem:   Gall stone in bile duct with infection of gallbladder Active Problems:   Essential hypertension   Hyperlipidemia   DM type 2 (diabetes mellitus, type 2) (HCC)   H/O: CVA (cerebrovascular accident)   Chronic anticoagulation- for h/o LLE DVT, on Eliquis.   Somnolence   Nephrolithiasis   LOS: 6 days

## 2018-07-21 NOTE — Significant Event (Addendum)
Rapid Response Event Note  Overview: Time Called: 2155 Arrival Time: 2305 Event Type: Neurologic  Initial Focused Assessment: Called by RN about patient's decreased LOC. Per RN, this has been ongoing since the middle of the day, patient's family are present and they stated the same. Initially when I was called, I was in with another patient in a medical emergency. RN stated she was not to give patient's PO meds and he was drooling. I asked the RN to check the patient's pupils, gag, and cough. When I arrived at 2255, patient was somnolent, did respond to painful, intermittently opens his eyes but not to commands, very weak cough and gag. Pupils were reactive. Unable to really cough of secretions, + food particles and secretions were suctioned. 99% on 3L [place], RR 16-20, lung sounds diminished in all fields, + 2 BUE and BLE edema. Was talking earlier in the early morning hours but has not since then. Last documented UO 350cc at 0600. Blood sugar was normal. VS stable. + GB drain.   Interventions: -- STAT HEAD CT - no acute change -- STAT CXR - Cardiac enlargement with mild pulmonary vascular congestion. Increasing opacity on the right likely representing layering pleural effusion with atelectasis or consolidation -- STAT LABS  -- I did update Neuro MD, MD saw the CT and suggested metabolic workup be started.  -- Bladder scan > 600 - I/O cath  Plan of Care: -- STRICT NPO/ASPIRATION PRECAUTIONS -- Monitor neurologic exam.  Event Summary: Name of Physician Notified: RR RN updated [facility] NP at 2315 and multiple other times    at    Outcome: Stayed in room and stabalized  Event End Time: 0052  Taylia Berber R

## 2018-07-21 NOTE — Progress Notes (Signed)
Pt received enema and tolerated well. Pt had a very large bowel movement.

## 2018-07-21 NOTE — Progress Notes (Signed)
   07/21/18 1400  Clinical Encounter Type  Visited With Patient;Family  Visit Type Follow-up;Psychological support;Spiritual support  Spiritual Encounters  Spiritual Needs Emotional;Prayer  Stress Factors  Patient Stress Factors Exhausted  Family Stress Factors Other (Comment) (concern for father)   F/u visit at req of pt's daughter when she saw me in hall.  Majority of time spent listening to daughter share her concerns and experiences and how they related to her understanding of her father's illness.  She was very appreciative of listening as well as chaplain support and visits.  Also spoke briefly on speakerphone w/ pt's granddaughter who called her mother who wanted me to greet her.  Margretta SidleAndrea M [place] Chaplain resident, [phone]

## 2018-07-21 NOTE — Evaluation (Signed)
Clinical/Bedside Swallow Evaluation Patient Details  Name: Tom Wang MRN: [id number] Date of Birth: Mar 15, 1925  Today's Date: 07/21/2018 Time: SLP Start Time (ACUTE ONLY): 1050 SLP Stop Time (ACUTE ONLY): 1125 SLP Time Calculation (min) (ACUTE ONLY): 35 min  Past Medical History:  Past Medical History:  Diagnosis Date  . Arthritis    ankles  . Diabetes mellitus    oral meds only  . GERD (gastroesophageal reflux disease)   . Hypertension   . Pneumonia    20 years ago .  Marland Kitchen. Stroke [facility](HCC)    Past Surgical History:  Past Surgical History:  Procedure Laterality Date  . CATARACT EXTRACTION W/PHACO  04/26/2012   Procedure: CATARACT EXTRACTION PHACO AND INTRAOCULAR LENS PLACEMENT (IOC);  Surgeon: Chalmers Guestoy Whitaker, MD;  [facility];  Service: Ophthalmology;  Laterality: Right;  . IR PERC CHOLECYSTOSTOMY  07/17/2018  . TONSILLECTOMY    . TUMOR EXCISION     60 years ago .. right forearm .    HPI:  84 year old male admitted 07/15/18 with nausea/vomiting and abdominal pain. Rapid response 07/20/18. PMH: DM, GERD, HTN, Left Bell's Palsy. MRI - Scattered small acute infarcts in the left PCA territory, Expected evolution of the November left thalamic and left pontine infarcts   Assessment / Plan / Recommendation Clinical Impression  Oral care was completed with suction. Pt allowed oral care, but did not open mouth sufficiently for visual examination. Slight material removed with suction. Pt accepted ice chips, thin liquid, nectar thick liquid, and puree trials. Ice chip and puree were tolerated without oral leakage or overt s/s aspiration.   Difficulty noted with both thin and nectar thick liquid, raising concern for airway compromise - cough response noted after each presentation. Pt kept his eyes closed throughout this evaluation, and was unable to cough/clear throat to command. At this time, critical po meds are recommended to be crushed in puree. Otherwise, NPO status is recommended at  this time, given weakness, inability to follow commands for airway protection, and high aspiration risk. RN provided po meds crushed in puree, which appeared to be tolerated well. MD and RN informed of results and recommendations. SLP will follow up to determine appropriateness for additional po intake.    SLP Visit Diagnosis: Dysphagia, unspecified (R13.10)    Aspiration Risk  Severe aspiration risk;Risk for inadequate nutrition/hydration    Diet Recommendation NPO except meds   Medication Administration: Crushed with puree    Other  Recommendations Oral Care Recommendations: Oral care before and after PO;Staff/trained caregiver to provide oral care   Follow up Recommendations (TBD)      Frequency and Duration min 2x/week  2 weeks       Prognosis Prognosis for Safe Diet Advancement: Fair      Swallow Study   General Date of Onset: 07/15/18 HPI: 84 year old male admitted 07/15/18 with nausea/vomiting and abdominal pain. Rapid response 07/20/18. PMH: DM, GERD, HTN, Left Bell's Palsy. MRI - Scattered small acute infarcts in the left PCA territory, Expected evolution of the November left thalamic and left pontine infarcts Type of Study: Bedside Swallow Evaluation Previous Swallow Assessment: SLE in August and November 2019. No previous BSE. Diet Prior to this Study: NPO Temperature Spikes Noted: (100 07/20/18 at 1300) Respiratory Status: Nasal cannula History of Recent Intubation: No Behavior/Cognition: Cooperative;Lethargic/Drowsy;Doesn't follow directions Oral Cavity Assessment: (minimal material removed from oral cavity with suction) Oral Care Completed by SLP: Yes Oral Cavity - Dentition: Adequate natural dentition Vision: (eyes closed throughout evaluation) Self-Feeding  Abilities: Total assist Patient Positioning: Upright in bed Baseline Vocal Quality: Low vocal intensity Volitional Cough: Cognitively unable to elicit Volitional Swallow: Unable to elicit     Oral/Motor/Sensory Function Overall Oral Motor/Sensory Function: Generalized oral weakness(history of multiple CVAs and left Bell's Palsy)   Ice Chips Ice chips: Within functional limits Presentation: Spoon   Thin Liquid Thin Liquid: Impaired Presentation: Cup;Spoon Oral Phase Impairments: Poor awareness of bolus;Reduced labial seal;Reduced lingual movement/coordination Oral Phase Functional Implications: Left anterior spillage Pharyngeal  Phase Impairments: Suspected delayed Swallow;Decreased hyoid-laryngeal movement;Cough - Immediate    Nectar Thick Nectar Thick Liquid: Impaired Presentation: Cup;Spoon Oral Phase Impairments: Reduced labial seal;Poor awareness of bolus Oral phase functional implications: Left anterior spillage Pharyngeal Phase Impairments: Suspected delayed Swallow;Cough - Immediate;Decreased hyoid-laryngeal movement   Honey Thick Honey Thick Liquid: Not tested   Puree Puree: Impaired Oral Phase Impairments: Poor awareness of bolus;Reduced labial seal Oral Phase Functional Implications: Prolonged oral transit Pharyngeal Phase Impairments: Decreased hyoid-laryngeal movement;Suspected delayed Swallow   Solid     Solid: Not tested     Tom Wang, [facility]annahMSP, CCC-SLP Speech Language Pathologist [phone]  Tom Wang, Fredderick Swanger Brown 07/21/2018,11:41 AM

## 2018-07-21 NOTE — Progress Notes (Signed)
Daughter refused hydralazine for BP 189/71, "every time he has it his heart rate goes into 100's."

## 2018-07-21 NOTE — Progress Notes (Signed)
Referring Physician(s): Dr Mahala MenghiniSamtani Dr Warren Lacy Cornett  Supervising Physician: Irish LackYamagata, Glenn  Patient Status:  Tom Wang - In-pt  Chief Complaint:  Per chole drain placed 12/16  Subjective:  Draining well Purulent OP Some better  Allergies: Morphine and related; Other; Gabapentin; Insulins; Lantus [insulin glargine]; and Penicillins  Medications: Prior to Admission medications   Medication Sig Start Date End Date Taking? Authorizing Provider  acetaminophen (TYLENOL) 500 MG tablet Take 500 mg by mouth daily.    Yes [provider]  Ascorbic Acid (VITAMIN C PO) Take 1 tablet by mouth daily.   Yes [provider]  aspirin 325 MG tablet Take 1 tablet (325 mg total) by mouth daily. 06/17/18  Yes Mikhail, Nita SellsMaryann, DO  atorvastatin (LIPITOR) 40 MG tablet Take 1 tablet (40 mg total) by mouth daily at 6 PM. 06/17/18  Yes Mikhail, WilseyMaryann, DO  cholecalciferol (VITAMIN D) 1000 UNITS tablet Take 500 Units by mouth at bedtime.    Yes [provider]  Cyanocobalamin (VITAMIN B-12 PO) Take 1 tablet by mouth daily.   Yes [provider]  glipiZIDE (GLUCOTROL) 10 MG tablet Take 10 mg by mouth 2 (two) times daily before a meal.   Yes [provider]  losartan (COZAAR) 25 MG tablet Take 0.5 tablets (12.5 mg total) by mouth daily. 06/22/18 07/22/18 Yes Mikhail, LockingtonMaryann, DO  Misc Natural Products (TURMERIC CURCUMIN) CAPS Take 1 capsule by mouth daily.    Yes [provider]  omeprazole (PRILOSEC) 20 MG capsule Take 20 mg by mouth daily.   Yes [provider]  senna-docusate (SENOKOT-S) 8.6-50 MG tablet Take 2 tablets by mouth daily.   Yes [provider]     Vital Signs: BP (!) 189/71 (BP Location: Right Arm)   Pulse 75   Temp 98.9 F (37.2 C) (Oral)   Resp 18   Ht 6\' 2"  (1.88 m)   Wt (!) 325 lb (147.4 kg)   SpO2 100%   BMI 41.73 kg/m   Physical Exam Skin:    General: Skin is warm and dry.     Comments: Site is clean  and dry NT OP purulent Good OP daily PROTEUS MIRABILIS   Organism ID, Bacteria ESCHERICHIA COLI        Imaging: Ct Head Wo Contrast  Result Date: 07/21/2018 CLINICAL DATA:  Altered mental status. EXAM: CT HEAD WITHOUT CONTRAST TECHNIQUE: Contiguous axial images were obtained from the base of the skull through the vertex without intravenous contrast. COMPARISON:  MR brain and CT head dated July 18, 2018. FINDINGS: Brain: No evidence of acute infarction, hemorrhage, hydrocephalus, extra-axial collection or mass lesion/mass effect. Stable atrophy and chronic microvascular ischemic changes. Vascular: Atherosclerotic vascular calcification of the carotid siphons. No hyperdense vessel. Skull: Negative for fracture or focal lesion. Sinuses/Orbits: No acute finding. Other: None. IMPRESSION: 1. No acute intracranial abnormality. Known small acute infarcts in the left PCA territory are not seen by CT. No hemorrhagic transformation. Electronically Signed   By: Obie DredgeWilliam T Derry M.D.   On: 07/21/2018 00:32   Ct Head Wo Contrast  Result Date: 07/18/2018 CLINICAL DATA:  84 year old who is unresponsive to stimuli, an acute change in mental status. EXAM: CT HEAD WITHOUT CONTRAST TECHNIQUE: Contiguous axial images were obtained from the base of the skull through the vertex without intravenous contrast. COMPARISON:  CT head 129-Mar-202019 and earlier. MRI brain 06/15/2018 and earlier. FINDINGS: Brain: Moderate to severe age related cortical atrophy, mild deep atrophy and mild cerebellar atrophy, unchanged. Severe changes  of small vessel disease of the white matter diffusely including the pons and brainstem, unchanged. No mass lesion. No midline shift. No acute hemorrhage or hematoma. No extra-axial fluid collections. No evidence of acute infarction. Vascular: Moderate BILATERAL carotid siphon and vertebral artery atherosclerosis. No hyperdense vessel. Skull: No skull fracture or other focal osseous abnormality  involving the skull. Sinuses/Orbits: Small mucous retention cyst or polyp involving the LEFT maxillary sinus. Minimal mucosal thickening involving the base of the frontal sinuses. Visualized paranasal sinuses otherwise well aerated. BILATERAL mastoid air cells and BILATERAL middle ear cavities well-aerated. Asymmetric hyperdense lens in the LEFT eye as noted previously. Orbits and globes otherwise unremarkable. Other: None. IMPRESSION: 1. No acute intracranial abnormality. 2. Stable moderate to severe age related generalized atrophy and severe chronic microvascular ischemic changes of the white matter. Electronically Signed   By: Hulan Saashomas  Bawi M.D.   On: 07/18/2018 10:02   Mr Brain Wo Contrast  Result Date: 07/18/2018 CLINICAL DATA:  84 year old male with recent altered mental status, unresponsiveness. EXAM: MRI HEAD WITHOUT CONTRAST TECHNIQUE: Multiplanar, multiecho pulse sequences of the brain and surrounding structures were obtained without intravenous contrast. COMPARISON:  Head CTs 0937 hours today and earlier. Brain MRI 06/15/2018. FINDINGS: Brain: There are new scattered small foci of restricted diffusion in the left occipital lobe, including about the atrium of the left lateral ventricle as seen on series 3, image 28. The left thalamic and small left pontine infarcts seen last month have evolved as expected. No associated acute hemorrhage or mass effect. No other acute ischemia. Chronic small vessel disease in both hemispheres with right side brainstem Wallerian degeneration redemonstrated. Superimposed chronic pontine small vessel disease. Occasional small microhemorrhages (including in the right dorsal pons on series 9, image 44. No midline shift, mass effect, evidence of mass lesion, ventriculomegaly, extra-axial collection or acute intracranial hemorrhage. Cervicomedullary junction and pituitary are within normal limits. Vascular: Major intracranial vascular flow voids are stable, the  vertebrobasilar flow voids are abnormal corresponding to the severe posterior circulation stenosis demonstrated by CTA and MRA in August this year. Skull and upper cervical spine: Negative visible cervical spine. Normal bone marrow signal. Sinuses/Orbits: Stable, negative. Other: Mastoids remain clear. Visible internal auditory structures appear normal. Scalp and face soft tissues appear negative. IMPRESSION: 1. Scattered small acute infarcts in the left PCA territory with no associated hemorrhage or mass effect. 2. Expected evolution of the November left thalamic and left pontine infarcts. 3. Underlying severe posterior circulation atherosclerosis and stenosis as seen by CTA and MRA and August. Electronically Signed   By: Odessa FlemingH  Hall M.D.   On: 07/18/2018 16:29   Ir Perc Cholecystostomy  Result Date: 07/17/2018 INDICATION: Acute cholecystitis EXAM: CHOLECYSTOSTOMY MEDICATIONS: Vancomycin 1 g ANESTHESIA/SEDATION: Fentanyl 75 mcg IV; Versed 1 mg IV Moderate Sedation Time:  13 minutes The patient was continuously monitored during the procedure by the interventional radiology nurse under my direct supervision. FLUOROSCOPY TIME:  Fluoroscopy Time: 1 minutes 18 seconds (10 mGy). COMPLICATIONS: None immediate. PROCEDURE: Informed written consent was obtained from the patient after a thorough discussion of the procedural risks, benefits and alternatives. All questions were addressed. Maximal Sterile Barrier Technique was utilized including caps, mask, sterile gowns, sterile gloves, sterile drape, hand hygiene and skin antiseptic. A timeout was performed prior to the initiation of the procedure. The right upper quadrant was prepped with ChloraPrep in a sterile fashion, and a sterile drape was applied covering the operative field. A sterile gown and sterile gloves were used for the procedure.  A 21 gauge needle was inserted into the gallbladder lumen under sonographic guidance and via transhepatic approach. It was removed  over a 018 wire, which was upsized to a 3-J. A 10-French drain was advanced over the wire and coiled in the gallbladder lumen. It was sewn to the skin after being string fixed. FINDINGS: Imaging demonstrates placement of a 10 French drain into the lumen of the gallbladder. Contrast fills the gallbladder. A gallstone is noted. The cystic duct is occluded. IMPRESSION: Successful cholecystostomy. This needs to remain in place at least 6 weeks. Electronically Signed   By: Jolaine Click M.D.   On: 07/17/2018 16:30   Dg Chest Port 1 View  Result Date: 07/21/2018 CLINICAL DATA:  Encephalopathy EXAM: PORTABLE CHEST 1 VIEW COMPARISON:  07/15/2018 FINDINGS: Shallow inspiration. Mild cardiac enlargement with mild pulmonary vascular congestion. Increasing opacity on the right mid and lower lung zone likely representing layering pleural effusion and atelectasis or consolidation. No pneumothorax. Calcification of the aorta. IMPRESSION: Cardiac enlargement with mild pulmonary vascular congestion. Increasing opacity on the right likely representing layering pleural effusion with atelectasis or consolidation. Electronically Signed   By: Burman Nieves M.D.   On: 07/21/2018 01:18    Labs:  CBC: Recent Labs    07/18/18 0830 07/19/18 0222 07/20/18 0336 07/21/18 0113  WBC 14.1* 14.0* 14.0* 13.3*  HGB 10.9* 11.5* 12.3* 12.3*  HCT 35.8* 35.5* 37.6* 38.3*  PLT 191 169 191 204    COAGS: Recent Labs    03/23/18 0317 06/14/18 1648 07/17/18 1415  INR 1.11 1.00 1.06  APTT 38* 38*  --     BMP: Recent Labs    07/18/18 0830 07/19/18 0222 07/20/18 0336 07/21/18 0113  NA 140 138 140 142  K 3.6 3.3* 3.3* 3.9  CL 106 105 105 106  CO2 24 24 26 22   GLUCOSE 307* 270* 252* 249*  BUN 10 12 8 11   CALCIUM 8.6* 8.9 9.2 9.0  CREATININE 1.10 1.08 0.84 1.12  GFRNONAA 59* >60 >60 NOT CALCULATED  GFRAA >60 >60 >60 NOT CALCULATED    LIVER FUNCTION TESTS: Recent Labs    07/15/18 0528 07/17/18 0145  07/19/18 0222 07/20/18 0336 07/21/18 0113  BILITOT 0.9 0.7 0.7  --  1.1  AST 15 15 18   --  30  ALT 10 12 17   --  23  ALKPHOS 63 70 67  --  79  PROT 7.5 6.8 6.0*  --  6.3*  ALBUMIN 3.5 2.7* 2.4* 2.5* 2.4*    Assessment and Plan:  Perc chole drain intact OP purulent For follow up in OP IR Clinic 6 weeks Orders in place-- will hear from scheduler for time and date Cont flush daily- 5-10 cc sterile saline Record OP  Electronically Signed: Robet Leu, PA-C 07/21/2018, 1:16 PM   I spent a total of 15 Minutes at the the patient's bedside AND on the patient's hospital floor or unit, greater than 50% of which was counseling/coordinating care for perc chole drain

## 2018-07-22 DIAGNOSIS — K8043 Calculus of bile duct with acute cholecystitis with obstruction: Secondary | ICD-10-CM

## 2018-07-22 LAB — CBC WITH DIFFERENTIAL/PLATELET
Abs Immature Granulocytes: 0.05 10*3/uL (ref 0.00–0.07)
Basophils Absolute: 0 10*3/uL (ref 0.0–0.1)
Basophils Relative: 0 %
EOS PCT: 2 %
Eosinophils Absolute: 0.2 10*3/uL (ref 0.0–0.5)
HCT: 36.5 % — ABNORMAL LOW (ref 39.0–52.0)
Hemoglobin: 11.2 g/dL — ABNORMAL LOW (ref 13.0–17.0)
Immature Granulocytes: 0 %
Lymphocytes Relative: 13 %
Lymphs Abs: 1.5 10*3/uL (ref 0.7–4.0)
MCH: 24.7 pg — AB (ref 26.0–34.0)
MCHC: 30.7 g/dL (ref 30.0–36.0)
MCV: 80.6 fL (ref 80.0–100.0)
MONOS PCT: 17 %
Monocytes Absolute: 1.9 10*3/uL — ABNORMAL HIGH (ref 0.1–1.0)
Neutro Abs: 7.5 10*3/uL (ref 1.7–7.7)
Neutrophils Relative %: 68 %
Platelets: 230 10*3/uL (ref 150–400)
RBC: 4.53 MIL/uL (ref 4.22–5.81)
RDW: 14.8 % (ref 11.5–15.5)
WBC: 11.2 10*3/uL — ABNORMAL HIGH (ref 4.0–10.5)
nRBC: 0 % (ref 0.0–0.2)

## 2018-07-22 LAB — GLUCOSE, CAPILLARY
Glucose-Capillary: 179 mg/dL — ABNORMAL HIGH (ref 70–99)
Glucose-Capillary: 184 mg/dL — ABNORMAL HIGH (ref 70–99)
Glucose-Capillary: 201 mg/dL — ABNORMAL HIGH (ref 70–99)
Glucose-Capillary: 230 mg/dL — ABNORMAL HIGH (ref 70–99)

## 2018-07-22 LAB — RENAL FUNCTION PANEL
ALBUMIN: 2.1 g/dL — AB (ref 3.5–5.0)
Anion gap: 12 (ref 5–15)
BUN: 8 mg/dL (ref 8–23)
CO2: 27 mmol/L (ref 22–32)
Calcium: 9 mg/dL (ref 8.9–10.3)
Chloride: 105 mmol/L (ref 98–111)
Creatinine, Ser: 0.99 mg/dL (ref 0.61–1.24)
GFR calc Af Amer: >60 mL/min (ref >60)
GFR calc non Af Amer: >60 mL/min (ref >60)
Glucose, Bld: 201 mg/dL — ABNORMAL HIGH (ref 70–99)
Phosphorus: 2.8 mg/dL (ref 2.5–4.6)
Potassium: 3.3 mmol/L — ABNORMAL LOW (ref 3.5–5.1)
SODIUM: 144 mmol/L (ref 135–145)

## 2018-07-22 LAB — AEROBIC/ANAEROBIC CULTURE W GRAM STAIN (SURGICAL/DEEP WOUND)

## 2018-07-22 LAB — AEROBIC/ANAEROBIC CULTURE (SURGICAL/DEEP WOUND)

## 2018-07-22 MED ORDER — HYDRALAZINE HCL 20 MG/ML IJ SOLN: 10.0000 mg | Freq: Four times a day (QID) | INTRAMUSCULAR | Status: DC | PRN

## 2018-07-22 MED ORDER — DEXTROSE-NACL 5-0.45 % IV SOLN
INTRAVENOUS | Status: AC
  Administered 2018-07-22: 15:00:00 via INTRAVENOUS

## 2018-07-22 MED ORDER — POLYVINYL ALCOHOL 1.4 % OP SOLN
1.0000 [drp] | OPHTHALMIC | Status: DC | PRN
  Filled 2018-07-22: qty 15

## 2018-07-22 MED ORDER — LABETALOL HCL 5 MG/ML IV SOLN
10.0000 mg | Freq: Once | INTRAVENOUS | Status: AC
  Administered 2018-07-22: 10 mg via INTRAVENOUS
  Filled 2018-07-22: qty 4

## 2018-07-22 NOTE — Progress Notes (Signed)
During patient bath nurse attempted to place sacral foam for prevention of skin breakdown and it was refused by patient daughter and granddaughter who was assisting nurse and tech with bath.  Educated the family on the foam preventing skin breakdown since patient is immobile and must have max assist to turn and maximove to get to chair.  Family insists it is not needed.  Nurse notes that there is no skin breakdown present at this time.  Will continue to monitor.

## 2018-07-22 NOTE — Progress Notes (Addendum)
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 Recent admission 1113-11/16 with acute CVA left-sided Readmitted on 07/15/2018 with generalized abdominal pain found to have cholecystitis with large stone in gallbladder neck 4 cm General surgery consulted-not felt to be a surgical candidate On admission labs are relatively normal slightly elevated glucose, WBC 24, hemoglobin 11-CT of abdomen pelvis showed the above Hospitalization complicated by metabolic encephalopathy and increasing confusion ultimately MRI done found to have a stroke  A & Plan Stroke-CT of the head shows stable changes-MRI= scattered multiple small infarcts left PCA territory no expected hemorrhage or mass-effect-neurology saw patient await repeat echocardiogram--d/w Dr. Roda ShuttersXu that Eliquis probably is the safest option going forward-continue Eliquis 5 mg twice daily  Acute metabolic encephalopathy-? 2/352metabolic encephalopathy also from sundowning--ammonia is negative I have had repeated discussions with family that this may be secondary to sundowning and multiple insults including CVA, infection and possible aspiration pneumonia-they do not understand clearly despite multiple repeated times and how strange ideas and fixed concrete thoughts about certain meds such as insulin and other medications causing strokes and there is no clear evidence for other supportive medical infection  Possible aspiration-holding p.o. meds at this time changing all to IV-we will also add ceftriaxone and Flagyl and covering see if mentation improves I suspect it will 130 insulin because Speech therapy seen patient and  recommends n.p.o. status  Constipation-give smog enema and had large stool on 12/20  Cholecystitis treated non-surgically-IR placed drain on 12/16 Drain IV antibiotics managed as OP by IR  Diabetes mellitus type 2 continue glipizide 10 continue metformin 850 insulin Patient able to take p.o.-family is not interested in insulin as they feel this caused "strokes"  HTN avoid fluctuations in blood pressure-changed to IV metoprolol 5 mg every 6 as needed and held metoprolol 25 daily  Reflux hold meds  Hyperlipidemia hold meds   DVT prophylaxis: Lovenox code Status: Full family Communication: None disposition Plan: Inpatient   Tom Gills, MD  [facility] Direct contact: [phone] --Via amion app OR  --[email or web address]; password [email or web address]  7PM-7AM contact night coverage as above 07/22/2018, 1:54 PM  LOS: 7 days   Consultants:  Neurology  General surgery  Procedures:  Echo is pending  Antimicrobials:  Invanz  Interval history/Subjective:  Intermittent interactivity otherwise warm to touch and low-grade temperatures despite has not made much progress or change still n.p.o. Family has many questions some of which are puzzling   Objective:  Vitals:  Vitals:   07/22/18 0729 07/22/18 1339  BP: (!) 170/61 (!) 181/68  Pulse: 72 72  Resp: 16   Temp: 99 F (37.2 C) 98 F (36.7 C)  SpO2: 98% 95%    Exam: sleepy but arouses to some degree thick neck no pallor no icterus Chest is clear Swelling to upper and lower extremities Abdomen soft drain in place Lower extremity mild edema   I have personally reviewed the following:   Labs:  Potassium 3.3 BUN/creatinine 8/0.9 WBC 11 down from 14  Imaging studies:  MRI brain confirms scattered small infarct left PCA without associated hemorrhage and expected evolution of November left thalamic and pontine infarct  Medical tests:  + 1.9 liters  Scheduled Meds: . apixaban  5 mg Oral BID  . lidocaine  1 patch  Transdermal Q24H  . mouth rinse  15 mL Mouth Rinse q12n4p  . metoprolol tartrate  5 mg Intravenous Q6H  . sodium chloride flush  5 mL Intracatheter Q8H  . sorbitol  20 mL Oral Daily   Continuous Infusions: . cefTRIAXone (ROCEPHIN)  IV 2 g (07/22/18 0820)  . metronidazole 500 mg (07/22/18 1003)    Principal Problem:   Gall stone in bile duct with infection of gallbladder Active Problems:   Essential hypertension   Hyperlipidemia   DM type 2 (diabetes mellitus, type 2) (HCC)   H/O: CVA (cerebrovascular accident)   Chronic anticoagulation- for h/o LLE DVT, on Eliquis.   Somnolence   Nephrolithiasis   LOS: 7 days

## 2018-07-22 NOTE — Progress Notes (Signed)
Patient's daughter Georgiann Mccoy(Juanita Cooper) requests that a neurologist be contacted in reference to what the patient's goal b/p should be.  She states she was told that a higher b/p was essential for a stroke patient.  Scheduled lopressor was held.  She states she is concerned the sorbitol is what has caused the patients lethargy.  Will continue to monitor.

## 2018-07-22 NOTE — Progress Notes (Signed)
  Speech Language Pathology Treatment: Dysphagia  Patient Details Name: Tom Wang MRN: [id number] DOB: March 19, 1925 Today's Date: 07/22/2018 Time: 1030-1100 SLP Time Calculation (min) (ACUTE ONLY): 30 min  Assessment / Plan / Recommendation Clinical Impression  Patient seen to address dysphagia goals and determine if ready for PO's. Patient's daughter is at bedside and another daughter is on speaker phone. Both were educated by this SLP regarding patient's current level of swallow function, concerns and risks of aspiration. Patient with eyes closed but will open eyes briefly when cued. He opened mouth with verbal cue and attempted to stick out tongue when cued but was not able to achieve significant lingual movement. Patient's daughter who was present in the room stated that he always keeps his eyes closed because they are dry but that he can't take eye drops because, as she stated, last time he took some prescribed eye drops he had a stroke and she associates the eye drops with causing the stroke. Patient is very fatigued and lethargic despite daughter's assertion that he is the contrary. Patient did exhibit minimal oral manipulation of small ice chip in mouth, and did exhibit swallow initiation with 1/4 teaspoon size bites of puree (applesauce), with laryngeal elevation per palpation, however SLP suctioned out residuals of applesauce from oral cavity after PO's.  After speaking with MD and patient's RN, there has been no significant change in his status since yesterday and based on his lethargic and weak appearance, patient is still not safe for PO's.  SLP spoke with patient's daughters at length regarding the reasons that starting him on PO diet was not appropriate at this time due to risk of aspiration PNA and the fact that in his current state, he is likely not strong enough to adequately fight a PNA. Patient's daughters unfortunately did not agree with SLP's decision and do not appear to be  appreciating or understanding the actual condition of patient.  If daughter does indeed take patient home to care for him upon discharge from hospital, she will most likely not comply with recommendations of any diet/PO restrictions.    HPI HPI: 84 year old male admitted 07/15/18 with nausea/vomiting and abdominal pain. Rapid response 07/20/18. PMH: DM, GERD, HTN, Left Bell's Palsy. MRI - Scattered small acute infarcts in the left PCA territory, Expected evolution of the November left thalamic and left pontine infarcts      SLP Plan  Continue with current plan of care       Recommendations  Diet recommendations: NPO Medication Administration: Crushed with puree                Oral Care Recommendations: Oral care before and after PO;Staff/trained caregiver to provide oral care Follow up Recommendations: Other (comment)(TBD: likely will be SNF level for care but family is currently refusing this and want to care for patient at home.) SLP Visit Diagnosis: Dysphagia, unspecified (R13.10) Plan: Continue with current plan of care       GO               Tom NevinJohn T. Sarabi Sockwell, MA, CCC-SLP 07/22/18 2:11 PM

## 2018-07-23 ENCOUNTER — Inpatient Hospital Stay (HOSPITAL_COMMUNITY): Payer: Medicare HMO

## 2018-07-23 LAB — GLUCOSE, CAPILLARY
GLUCOSE-CAPILLARY: 223 mg/dL — AB (ref 70–99)
Glucose-Capillary: 228 mg/dL — ABNORMAL HIGH (ref 70–99)
Glucose-Capillary: 236 mg/dL — ABNORMAL HIGH (ref 70–99)
Glucose-Capillary: 238 mg/dL — ABNORMAL HIGH (ref 70–99)

## 2018-07-23 NOTE — Progress Notes (Addendum)
  Speech Language Pathology Treatment: Dysphagia  Patient Details Name: Tom GessLawrence Wang MRN: [id number] DOB: August 30, 1924 Today's Date: 07/23/2018 Time: [id number] SLP Time Calculation (min) (ACUTE ONLY): 42 min  Assessment / Plan / Recommendation Clinical Impression  SLP returned for reassessment; pt upright in chair but remains lethargic, minimally responsive. SLP facilitated alertness with wet washcloth to face, and by providing oral care, sternal rub. Pt lifts his head briefly but keeps his eyes closed, does not vocalize. With thermal stimulation to lips (ice chip), pt purses lips and attempts to retrieve bolus. He accepts small ice chip from the spoon, masticates (prolonged bolus formation), and does initiate a swallow, with immediate, weak cough noted. With second ice chip, pt accepts, but oral transit is passive and pt does not initiate swallow despite max cues, with delayed coughing concerning for decreased airway protection. [name] present and I educated her at length re: swallow physiology, aspiration risk given pt's current mentation. Family is very concerned about nutrition. She asked about doing an "ultrasound" to see if pt is swallowing the right way. I provided education re: MBS for objective evaluation of swallow function, as well as criteria to determine readiness for such an assessment, explaining that if pt is not alert and following commands, he would not be able to participate in this assessment. We discussed pt's prognosis; granddaughter asking about a feeding tube, and I provided education re: short term and long-term means of nutrition. She is considering short-term nutrition to see if pt can "get stronger" and alert enough to be able to swallow. I asked pt what the patient would want if he did not improve. She stated that she did not think pt would want to have a feeding tube long-term. I discussed palliative care with her and their role in helping families and people with  critical illness make decisions about their care. She did seem open to having a conversation with palliative care. She also stated that if they opt for short term nutrition, she would prefer smaller bore feeding tube (cortrak). I assured her that SLP would continue to follow pt even if cortrak placed to determine if pt is making any improvements with swallowing. Discussed with RN, MD. Will defer cognitive-linguistic evaluation this date as pt not alert enough to participate.     HPI HPI: 84 year old male admitted 07/15/18 with nausea/vomiting and abdominal pain. Rapid response 07/20/18. PMH: DM, GERD, HTN, Left Bell's Palsy. MRI - Scattered small acute infarcts in the left PCA territory, Expected evolution of the November left thalamic and left pontine infarcts      SLP Plan  Continue with current plan of care       Recommendations  Diet recommendations: NPO Medication Administration: Via alternative means                Oral Care Recommendations: Oral care QID Follow up Recommendations: Other (comment)(tbd) SLP Visit Diagnosis: Dysphagia, unspecified (R13.10) Plan: Continue with current plan of care       GO              Tom BatonMary Beth Tom Hockett, MS, CCC-SLP Speech-Language Pathologist Acute Rehabilitation Services Pager: [phone] Office: [phone]   Tom Wang 07/23/2018, 4:46 PM

## 2018-07-23 NOTE — Progress Notes (Signed)
Transferred patient from chair to bed with maximove with NT Deandra.  When trying to put condom catheter back in place a blister at head of penis noted.  Refrained from placing condom catheter.  Granddaughter Gearldine BienenstockBrandy made aware of blister and visualized.  Agreed with plan to assess patient for moisture frequently and change out pads as necessary.  Will continue to monitor.

## 2018-07-23 NOTE — Progress Notes (Signed)
SLP Cancellation Note  Patient Details Name: Tom Wang MRN: [id number] DOB: Sep 09, 1924   Cancelled treatment:       Reason Eval/Treat Not Completed: Fatigue/lethargy limiting ability to participate. Attempted to follow up for PO readiness, speech-language evaluation; pt is lethargic. Daughter present and requests SLP return in afternoons, when she feels pt is more awake. Will reattempt as schedule allows.  Rondel BatonMary Beth Julen Rubert, [place]MS, CCC-SLP Speech-Language Pathologist Acute Rehabilitation Services Pager: [phone] Office: [phone]    Arlana LindauMary E Zarius Furr 07/23/2018, 10:02 AM

## 2018-07-23 NOTE — Progress Notes (Addendum)
Patient family( two daughters and niece) very concerned over metoprolol patient getting. Spent an hour in the room with family discussing medication, it's action and importance in maintaining patient blood pressure and reassuring his heart rate was safe for him to take it. Patient family refused medication at first and after a long discussion agreed to him taking it but wanted this nurse to reassess his  BP in an hour. Upon obtaining BP not significant difference. Family discussed again and wanted patient to get metoprolol for the night and voiced concern that they wanted to discuss medication with DR in AM.

## 2018-07-23 NOTE — Progress Notes (Signed)
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 Recent admission 1113-11/16 with acute CVA left-sided Readmitted on 07/15/2018 with generalized abdominal pain found to have cholecystitis with large stone in gallbladder neck 4 cm General surgery consulted-not felt to be a surgical candidate On admission labs are relatively normal slightly elevated glucose, WBC 24, hemoglobin 11-CT of abdomen pelvis showed the above Hospitalization complicated by metabolic encephalopathy and increasing confusion ultimately MRI done found to have a stroke  A & Plan Stroke-CT of the head shows stable changes-MRI= scattered multiple small infarcts left PCA territory no expected hemorrhage or mass-effect-neurology saw patient await repeat echocardiogram--d/w Dr. Roda ShuttersXu that Eliquis probably is the safest option going forward-continue Eliquis 5 mg twice daily  Acute metabolic encephalopathy-? 2/652metabolic encephalopathy also from sundowning--ammonia is negative I have had repeated discussions with family that this may be secondary to sundowning and multiple insults including CVA, infection and possible aspiration pneumonia--- family feels that the patient is more interactive in the evening so I have asked nursing to try and reset his circadian rhythms by raising the blinds and stimulating the patient more during the day  Possible aspiration-holding p.o. meds at this time changing all to IV-we will also add ceftriaxone and Flagyl and covering see if mentation improves I suspect it will 130 insulin because Speech therapy seen patient and recommends n.p.o. status They should follow-up when able when  patient is more interactive to be able to cooperate with therapy for now holding meds except for Eliquis Repeat chest x-ray  Constipation-give smog enema and had large stool on 12/20 No stool since 12/20 so may need to repeat enema in the next day or so  Cholecystitis treated non-surgically-IR placed drain on 12/16 Drain IV antibiotics managed as OP by IR  Diabetes mellitus type 2 continue glipizide 10 continue metformin 850 insulin Patient able to take p.o.-family is not interested in insulin as they feel this caused "strokes"  HTN avoid fluctuations in blood pressure-changed to IV metoprolol 5 mg every 6 as needed and held metoprolol 25 daily  Reflux hold meds  Hyperlipidemia hold meds   DVT prophylaxis: Lovenox code Status: Full family Communication: None disposition Plan: Inpatient   Tom Granier, MD  [facility] Direct contact: [phone] --Via amion app OR  --[email or web address]; password [email or web address]  7PM-7AM contact night coverage as above 07/23/2018, 9:29 AM  LOS: 8 days   Consultants:  Neurology  General surgery  Procedures:  Echo is pending  Antimicrobials:  Invanz  Interval history/Subjective:  Intermittently interactive mumbles some words but not very coherent No chills no rigors Still not awake enough to really take p.o. Daughter at bedside updated in entirety  Objective:  Vitals:  Vitals:   07/23/18 0511 07/23/18 0920  BP: (!) 171/70 (!) 146/58  Pulse: 73 68  Resp: 18   Temp: 99.3 F (37.4 C)   SpO2: 94% 92%    Exam: Arousable but then goes right back to sleep has twisting of the mouth which is not new Chest is clear posterolaterally Swelling to upper and lower extremities which is unchanged Abdomen soft drain in place Lower extremity mild edema   I have personally reviewed the following:   Labs: No labs today recheck a.m.   Imaging studies:  MRI  brain confirms scattered small infarct left PCA without associated hemorrhage and  expected evolution of November left thalamic and pontine infarct  Medical tests:  + 2.3 L since admission Drain is put out 100  Scheduled Meds: . apixaban  5 mg Oral BID  . lidocaine  1 patch Transdermal Q24H  . mouth rinse  15 mL Mouth Rinse q12n4p  . metoprolol tartrate  5 mg Intravenous Q6H  . sodium chloride flush  5 mL Intracatheter Q8H  . sorbitol  20 mL Oral Daily   Continuous Infusions: . cefTRIAXone (ROCEPHIN)  IV 2 g (07/23/18 0834)  . dextrose 5 % and 0.45% NaCl 40 mL/hr at 07/23/18 0400  . metronidazole 500 mg (07/23/18 0925)    Principal Problem:   Gall stone in bile duct with infection of gallbladder Active Problems:   Essential hypertension   Hyperlipidemia   DM type 2 (diabetes mellitus, type 2) (HCC)   H/O: CVA (cerebrovascular accident)   Chronic anticoagulation- for h/o LLE DVT, on Eliquis.   Somnolence   Nephrolithiasis   LOS: 8 days

## 2018-07-24 DIAGNOSIS — G9340 Encephalopathy, unspecified: Secondary | ICD-10-CM

## 2018-07-24 LAB — CBC WITH DIFFERENTIAL/PLATELET
Abs Immature Granulocytes: 0.03 10*3/uL (ref 0.00–0.07)
Basophils Absolute: 0.1 10*3/uL (ref 0.0–0.1)
Basophils Relative: 1 %
Eosinophils Absolute: 0.2 10*3/uL (ref 0.0–0.5)
Eosinophils Relative: 2 %
HCT: 34.9 % — ABNORMAL LOW (ref 39.0–52.0)
Hemoglobin: 11.1 g/dL — ABNORMAL LOW (ref 13.0–17.0)
Immature Granulocytes: 0 %
Lymphocytes Relative: 17 %
Lymphs Abs: 1.7 10*3/uL (ref 0.7–4.0)
MCH: 26.2 pg (ref 26.0–34.0)
MCHC: 31.8 g/dL (ref 30.0–36.0)
MCV: 82.3 fL (ref 80.0–100.0)
Monocytes Absolute: 1.6 10*3/uL — ABNORMAL HIGH (ref 0.1–1.0)
Monocytes Relative: 16 %
Neutro Abs: 6.3 10*3/uL (ref 1.7–7.7)
Neutrophils Relative %: 64 %
Platelets: 286 10*3/uL (ref 150–400)
RBC: 4.24 MIL/uL (ref 4.22–5.81)
RDW: 14.9 % (ref 11.5–15.5)
WBC: 9.9 10*3/uL (ref 4.0–10.5)
nRBC: 0 % (ref 0.0–0.2)

## 2018-07-24 LAB — GLUCOSE, CAPILLARY
Glucose-Capillary: 184 mg/dL — ABNORMAL HIGH (ref 70–99)
Glucose-Capillary: 174 mg/dL — ABNORMAL HIGH (ref 70–99)
Glucose-Capillary: 190 mg/dL — ABNORMAL HIGH (ref 70–99)
Glucose-Capillary: 187 mg/dL — ABNORMAL HIGH (ref 70–99)

## 2018-07-24 LAB — BASIC METABOLIC PANEL
Anion gap: 13 (ref 5–15)
BUN: 7 mg/dL — ABNORMAL LOW (ref 8–23)
CALCIUM: 9 mg/dL (ref 8.9–10.3)
CO2: 30 mmol/L (ref 22–32)
Chloride: 104 mmol/L (ref 98–111)
Creatinine, Ser: 0.86 mg/dL (ref 0.61–1.24)
GFR calc Af Amer: >60 mL/min (ref >60)
GFR calc non Af Amer: >60 mL/min (ref >60)
Glucose, Bld: 230 mg/dL — ABNORMAL HIGH (ref 70–99)
Potassium: 2.9 mmol/L — ABNORMAL LOW (ref 3.5–5.1)
Sodium: 147 mmol/L — ABNORMAL HIGH (ref 135–145)

## 2018-07-24 MED ORDER — POTASSIUM CHLORIDE IN NACL 20-0.9 MEQ/L-% IV SOLN
INTRAVENOUS | Status: DC
  Administered 2018-07-24 – 2018-07-25 (×3): via INTRAVENOUS
  Filled 2018-07-24 (×3): qty 1000

## 2018-07-24 MED ORDER — POTASSIUM CHLORIDE 10 MEQ/100ML IV SOLN
10.0000 meq | INTRAVENOUS | Status: AC
  Administered 2018-07-24 (×4): 10 meq via INTRAVENOUS
  Filled 2018-07-24: qty 100

## 2018-07-24 NOTE — Care Management Important Message (Signed)
Important Message  Patient Details  Name: Tom Wang MRN: [id number] Date of Birth: Jan 29, 1925   Medicare Important Message Given:  Yes    [name] 07/24/2018, 4:48 PM

## 2018-07-24 NOTE — Progress Notes (Signed)
  Speech Language Pathology Treatment: Dysphagia  Patient Details Name: Tom Wang MRN: [id number] DOB: 01-06-1925 Today's Date: 07/24/2018 Time: 1635-1700 SLP Time Calculation (min) (ACUTE ONLY): 25 min  Assessment / Plan / Recommendation Clinical Impression  F/u to determine readiness to begin PO diet.  Pt remains lethargic; he is stimulable with sternal rub/ calling his name, and he sustained attention briefly in order to accept and swallow some ice chips with max cues needed to seal lips around spoon.  Trials of thin water elicited immediate and consistent coughing, concerning for aspiration. For now, when pt is alert, allow ice chips after oral care.  Pt is clearly not sufficiently awake to begin a diet.  No cortrak was placed today.  From notes, it appears palliative care consult is pending.  Pt's daughter will benefit from this conversation.  SLP will follow for plan, potential to resume a diet.    HPI HPI: 84 year old male admitted 07/15/18 with nausea/vomiting and abdominal pain. Underwent a percutaneous cholecystostomy drain placement. Rapid response 07/20/18. PMH: DM, GERD, HTN, Left Bell's Palsy. MRI - Scattered small acute infarcts in the left PCA territory, expected evolution of the November left thalamic and left pontine infarcts      SLP Plan  Continue with current plan of care       Recommendations  Diet recommendations: NPO                Oral Care Recommendations: Oral care QID;Oral care prior to ice chip/H20 Follow up Recommendations: (tbd) SLP Visit Diagnosis: Dysphagia, unspecified (R13.10) Plan: Continue with current plan of care       GO                Tom Wang, Tom Wang 07/24/2018, 5:08 PM  Tom Wang L. Samson Fredericouture, MA CCC/SLP Acute Rehabilitation Services Office number [phone] Pager [phone]

## 2018-07-24 NOTE — Progress Notes (Signed)
Noted increased swelling around patient scrotum. Patient daughter and granddaughter insisted personal pad from home wrapped around patient perineal area to absorb urine better. Educated white pads underneath were for that and we wanted to prevent moisture from remaining in area for too long. Family still requests pad between patient legs

## 2018-07-24 NOTE — Progress Notes (Addendum)
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 Recent admission 1113-11/16 with acute CVA left-sided. Readmitted on 07/15/2018 with generalized abdominal pain found to have cholecystitis with large stone in gallbladder neck 4 cm. General surgery consulted-not felt to be a surgical candidate.  He underwent a percutaneous cholecystostomy drain placement. Hospitalization complicated by metabolic encephalopathy and increasing confusion ultimately MRI done found to have a stroke.  Reason for Visit: Acute encephalopathy likely metabolic  Consultants: Neurology.  General surgery.  Palliative medicine  Procedures:  Echocardiogram Study Conclusions  - Left ventricle: Moderate basal septal hypertrophy. The cavity   size was normal. Systolic function was vigorous. The estimated   ejection fraction was in the range of 65% to 70%. Doppler   parameters are consistent with both elevated ventricular   end-diastolic filling pressure and elevated left atrial filling   pressure. - Left atrium: The atrium was mildly dilated.  Antibiotics: Ceftriaxone and metronidazole  Subjective/Interval History: Patient opens his eyes but closes them right back.  Does not really communicate.  His daughter and granddaughter are at the bedside.  Not following commands either.  ROS: Unable to do due to his encephalopathy  Objective:  Vital Signs  Vitals:   07/23/18 0920 07/23/18 1409 07/23/18 2140 07/24/18 0516  BP: (!) 146/58 (!) 181/68 (!) 170/68 (!) 177/76  Pulse: 68 73 62 72  Resp:  18 16 18   Temp:  99 F (37.2 C) 98.5 F (36.9 C) 99 F  (37.2 C)  TempSrc:  Oral Oral Oral  SpO2: 92% 98% 97% 98%  Weight:      Height:        Intake/Output Summary (Last 24 hours) at 07/24/2018 1151 Last data filed at 07/24/2018 0900 Gross per 24 hour  Intake 1370 ml  Output 75 ml  Net 1295 ml   Filed Weights   07/15/18 0454  Weight: (!) 147.4 kg    General appearance: Opens eyes but goes right back to sleep.  Does not appear to be in any distress. Head: Normocephalic, without obvious abnormality, atraumatic Resp: Appears to have normal effort.  Diminished air entry at the bases.  No wheezing or rhonchi. Cardio: regular rate and rhythm, S1, S2 normal, no murmur, click, rub or gallop GI: soft, non-tender; bowel sounds normal; no masses,  no organomegaly Extremities: extremities normal, atraumatic, no cyanosis or edema Pulses: 2+ and symmetric Neurologic: Does not follow commands.  Opens eyes.  Lab Results:  Data Reviewed: I have personally reviewed following labs and imaging studies  CBC: Recent Labs  Lab 07/19/18 0222 07/20/18 0336 07/21/18 0113 07/22/18 0429 07/24/18 0152  WBC 14.0* 14.0* 13.3* 11.2* 9.9  NEUTROABS  --  10.6* 9.0* 7.5 6.3  HGB 11.5* 12.3* 12.3* 11.2* 11.1*  HCT 35.5* 37.6* 38.3* 36.5* 34.9*  MCV 80.3 79.3* 80.5 80.6 82.3  PLT 169 191 204 230 286    Basic Metabolic Panel: Recent Labs  Lab 07/19/18 0222 07/20/18 0336 07/21/18 0113 07/22/18 0429 07/24/18 0152  NA 138 140 142 144 147*  K 3.3* 3.3* 3.9 3.3* 2.9*  CL 105 105 106 105 104  CO2 24 26 22 27 30   GLUCOSE 270* 252* 249* 201* 230*  BUN 12 8 11  8 7*  CREATININE 1.08 0.84 1.12 0.99 0.86  CALCIUM 8.9 9.2 9.0 9.0 9.0  MG  --   --  2.1  --   --   PHOS  --  2.3*  --  2.8  --     GFR: Estimated Creatinine Clearance: 87.5 mL/min (by C-G formula based on SCr of 0.86 mg/dL).  Liver Function Tests: Recent Labs  Lab 07/19/18 0222 07/20/18 0336 07/21/18 0113 07/22/18 0429  AST 18  --  30  --   ALT 17  --  23  --   ALKPHOS 67  --  79   --   BILITOT 0.7  --  1.1  --   PROT 6.0*  --  6.3*  --   ALBUMIN 2.4* 2.5* 2.4* 2.1*     Recent Labs  Lab 07/20/18 1048  AMMONIA 25    Coagulation Profile: Recent Labs  Lab 07/17/18 1415  INR 1.06    CBG: Recent Labs  Lab 07/23/18 0814 07/23/18 1222 07/23/18 1722 07/23/18 2141 07/24/18 0832  GLUCAP 223* 228* 236* 238* 184*      Recent Results (from the past 240 hour(s))  Blood culture (routine x 2)     Status: None   Collection Time: 07/15/18  5:30 AM  Result Value Ref Range Status   Specimen Description BLOOD LEFT ARM  Final   Special Requests   Final    BOTTLES DRAWN AEROBIC ONLY Blood Culture adequate volume   Culture   Final    NO GROWTH 5 DAYS Performed at [facility], [address] [place], [place] [address]    Report Status 07/20/2018 FINAL  Final  Blood culture (routine x 2)     Status: None   Collection Time: 07/15/18  5:45 AM  Result Value Ref Range Status   Specimen Description BLOOD LEFT HAND  Final   Special Requests   Final    BOTTLES DRAWN AEROBIC AND ANAEROBIC Blood Culture adequate volume   Culture   Final    NO GROWTH 5 DAYS Performed at [facility], [address] [place], [place] [address]    Report Status 07/20/2018 FINAL  Final  Urine culture     Status: None   Collection Time: 07/15/18 10:33 AM  Result Value Ref Range Status   Specimen Description URINE, RANDOM  Final   Special Requests NONE  Final   Culture   Final    NO GROWTH Performed at [facility], [address] [place], [place] [address]    Report Status 07/16/2018 FINAL  Final  Surgical PCR screen     Status: None   Collection Time: 07/16/18  4:23 AM  Result Value Ref Range Status   MRSA, PCR NEGATIVE NEGATIVE Final   Staphylococcus aureus NEGATIVE NEGATIVE Final    Comment: (NOTE) The Xpert SA Assay (FDA approved for NASAL specimens in patients 82 years of age and older), is one component of a comprehensive surveillance program. It is  not intended to diagnose infection nor to guide or monitor treatment. Performed at [facility], [address] [place], [place] [address]   Aerobic/Anaerobic Culture (surgical/deep wound)     Status: None   Collection Time: 07/17/18  5:01 PM  Result Value Ref Range Status   Specimen Description GALL BLADDER BILE  Final   Special Requests NONE  Final   Gram Stain   Final    ABUNDANT WBC PRESENT, PREDOMINANTLY PMN MODERATE GRAM  NEGATIVE RODS RARE GRAM VARIABLE ROD Performed at [facility], [address] [place], [place] [address]    Culture   Final    MODERATE ESCHERICHIA COLI ABUNDANT PROTEUS MIRABILIS MIXED ANAEROBIC FLORA PRESENT.  CALL LAB IF FURTHER IID REQUIRED.    Report Status 07/22/2018 FINAL  Final   Organism ID, Bacteria PROTEUS MIRABILIS  Final   Organism ID, Bacteria ESCHERICHIA COLI  Final      Susceptibility   Escherichia coli - MIC*    AMPICILLIN >=32 RESISTANT Resistant     CEFAZOLIN 8 SENSITIVE Sensitive     CEFEPIME <=1 SENSITIVE Sensitive     CEFTAZIDIME <=1 SENSITIVE Sensitive     CEFTRIAXONE <=1 SENSITIVE Sensitive     CIPROFLOXACIN <=0.25 SENSITIVE Sensitive     GENTAMICIN <=1 SENSITIVE Sensitive     IMIPENEM <=0.25 SENSITIVE Sensitive     TRIMETH/SULFA 40 SENSITIVE Sensitive     AMPICILLIN/SULBACTAM 8 SENSITIVE Sensitive     PIP/TAZO <=4 SENSITIVE Sensitive     Extended ESBL NEGATIVE Sensitive     * MODERATE ESCHERICHIA COLI   Proteus mirabilis - MIC*    AMPICILLIN <=2 SENSITIVE Sensitive     CEFAZOLIN <=4 SENSITIVE Sensitive     CEFEPIME <=1 SENSITIVE Sensitive     CEFTAZIDIME <=1 SENSITIVE Sensitive     CEFTRIAXONE <=1 SENSITIVE Sensitive     CIPROFLOXACIN >=4 RESISTANT Resistant     GENTAMICIN <=1 SENSITIVE Sensitive     IMIPENEM 2 SENSITIVE Sensitive     TRIMETH/SULFA <=20 SENSITIVE Sensitive     AMPICILLIN/SULBACTAM <=2 SENSITIVE Sensitive     PIP/TAZO <=4 SENSITIVE Sensitive     * ABUNDANT PROTEUS MIRABILIS       Radiology Studies: Dg Chest Port 1 View  Result Date: 07/23/2018 CLINICAL DATA:  Pneumonia, history hypertension, diabetes mellitus EXAM: PORTABLE CHEST 1 VIEW COMPARISON:  Portable exam 0948 hours compared to 07/21/2018 FINDINGS: Wall heart size and pulmonary vascularity. Atherosclerotic calcification aorta. Persistent RIGHT pleural effusion. Persistent opacity at RIGHT base which could represent atelectasis and/or consolidation. Remaining lungs clear. No pleural effusion or pneumothorax. Bones demineralized. Pigtail drainage catheter in RIGHT upper quadrant. IMPRESSION: Persistent RIGHT pleural effusion and basilar atelectasis and/or consolidation. Electronically Signed   By: Ulyses SouthwardMark  Boles M.D.   On: 07/23/2018 10:02     Medications:  Scheduled: . apixaban  5 mg Oral BID  . lidocaine  1 patch Transdermal Q24H  . mouth rinse  15 mL Mouth Rinse q12n4p  . metoprolol tartrate  5 mg Intravenous Q6H  . sodium chloride flush  5 mL Intracatheter Q8H  . sorbitol  20 mL Oral Daily   Continuous: . cefTRIAXone (ROCEPHIN)  IV 2 g (07/24/18 0832)  . metronidazole 500 mg (07/24/18 1008)  . potassium chloride     UEA:[id number]:polyvinyl alcohol    Assessment/Plan:  Acute metabolic encephalopathy Likely multifactorial including hospital stay, acute infection, acute stroke.  Baseline is not clearly known but at least at home he was eating.  Patient has had very poor oral intake here.  He has been seen by speech therapy.  Mentation has not really improved much in the last few days.  Family concerned about his nutrition.  We will consult palliative medicine for goals of care conversation.  Acute stroke Due to persistent encephalopathy patient underwent MRI which showed multiple infarcts in the left PCA territory.  Patient seen by neurology.  Placed on Eliquis.  Possible aspiration Holding his oral medications.  Speech therapy following.  Prognosis  uncertain at this time.  Patient on ceftriaxone and  metronidazole.  History of constipation Has required enemas.  Continue to monitor.  Acute cholecystitis Status post cholecystostomy drain placement on 12/16.  To be followed in the drain clinic.  Fluid did grow E. Coli and Proteus.  Both are sensitive to ceftriaxone.  Continue antibiotics for total of 7 to 10 days.  Diabetes mellitus type 2 Holding his oral agents.  Monitor CBGs.  SSI.  Essential hypertension Blood pressure poorly controlled primarily because he is not able to take his oral medications.  He is on IV metoprolol every 6 hours as needed.  Continue to monitor.  History of GERD Holding his oral medication  History of hyperlipidemia Holding his oral medications.  Normocytic anemia Hemoglobin is stable.  No evidence of overt bleeding.  Hypokalemia We will replete intravenously.  Recheck tomorrow.  Start maintenance fluids with KCl.   DVT Prophylaxis: Apixaban    Code Status: Full code Family Communication: Discussed with the patient's daughter and granddaughter Disposition Plan: Management as outlined above.  Palliative medicine consulted for goals of care.    LOS: 9 days   Osvaldo Shipper  [facility] Pager [phone] 07/24/2018, 11:51 AM  If 7PM-7AM, please contact night-coverage at [email or web address], password [facility]

## 2018-07-24 NOTE — Progress Notes (Signed)
Flutter device placed on patients computer at this time. Patient sound asleep. Family at bedside. RN aware.

## 2018-07-24 NOTE — Progress Notes (Signed)
PT Cancellation Note  Patient Details Name: Tom GessLawrence Schneeberger MRN: [id number] DOB: [birth year]   Cancelled Treatment:    Reason Eval/Treat Not Completed: Medical issues which prohibited therapy. Pt's K+ 2.9 with am labs. Held PT per rehab protocol. Will follow up at another date.   Sallyanne KusterKathy Bury, PTA, CLT Acute Rehab Services [phone]- [phone] 07/24/18, 10:31 AM   Sallyanne KusterBury, Kathy 07/24/2018, 10:30 AM

## 2018-07-24 NOTE — Progress Notes (Signed)
Nutrition Follow-up  DOCUMENTATION CODES:   Morbid obesity  INTERVENTION:   -RD will follow for diet advancement and supplement as appropriate -If pt/family desires nutrition support, recommend:  Initiate Jevity 1.5 @ 25 ml/hr and increase by 10 ml every 4 hours to goal rate of 55 ml/hr.   30 ml Prostat daily.    Tube feeding regimen provides 2080 kcal (100% of needs), 99 grams of protein, and 1003 ml of H2O.   NUTRITION DIAGNOSIS:   Inadequate oral intake related to lethargy/confusion as evidenced by meal completion < 50%, per patient/family report.  Ongoing  GOAL:   Patient will meet greater than or equal to 90% of their needs  Progressing  MONITOR:   PO intake, Supplement acceptance, Labs, Weight trends, Skin, I & O's  REASON FOR ASSESSMENT:   Low Braden    ASSESSMENT:   Tom Wang is a 84 y.o. male with medical history significant for diabetes mellitus recent stroke hypertension GERD, who was brought by ambulance from home because of sudden onset of nausea and vomiting around lunchtime yesterday family reported patient had been less active than usual seem more confused and tired and sleeping a lot.  He had complained of an abdominal pain earlier but not recently.  He stated that he does not usually usually complain a lot.  Do think he may have also have some fever and they were concerned about possible infection.  He does have a little bit of cough with a little congestion he denied any diarrhea.    12/15- HIDA scan positive for cholecystitis  12/16- s/p percutaneous cholecystostomy  12/17- sp neurology evaluation- pt with acute ischemic stroke- punctate infarctions 12/19- rapid response due to LOC; s/p BSE- recommend NPO 12/21- repeat BSE- recommend continued NPO 12/22- repeat BSE- recommend continued NPO; recommending palliative care consult  Reviewed I/O's: +2.3 L x 24 hours and +3.7 L since admission  Pt has been NPO since 07/20/18 due to lethargy. Per  SLP notes, pt is unsafe for PO intake currently. Palliative care team has been consulted for goals of care. Per SLP note, pt family considering short term feeding tube placement.   Labs reviewed: Na: 147, K: 2.9, CBGS: 175-238.   Diet Order:   Diet Order            Diet NPO time specified  Diet effective now              EDUCATION NEEDS:   Education needs have been addressed  Skin:  Skin Assessment: Skin Integrity Issues: Skin Integrity Issues:: Incisions, Diabetic Ulcer Diabetic Ulcer: lt toe Incisions: closed abdomen  Last BM:  07/22/18  Height:   Ht Readings from Last 1 Encounters:  07/15/18 6\' 2"  (1.88 m)    Weight:   Wt Readings from Last 1 Encounters:  07/15/18 (!) 147.4 kg    Ideal Body Weight:  86.4 kg  BMI:  Body mass index is 41.73 kg/m.  Estimated Nutritional Needs:   Kcal:  2000-2200  Protein:  95-110 grams  Fluid:  2.0-2.2 L    Tom Wang A. Mayford KnifeWilliams, RD, LDN, CDE Pager: [phone] After hours Pager: [phone]

## 2018-07-25 DIAGNOSIS — K8000 Calculus of gallbladder with acute cholecystitis without obstruction: Secondary | ICD-10-CM

## 2018-07-25 DIAGNOSIS — J690 Pneumonitis due to inhalation of food and vomit: Secondary | ICD-10-CM

## 2018-07-25 DIAGNOSIS — Z515 Encounter for palliative care: Secondary | ICD-10-CM

## 2018-07-25 DIAGNOSIS — Z7189 Other specified counseling: Secondary | ICD-10-CM

## 2018-07-25 DIAGNOSIS — I639 Cerebral infarction, unspecified: Secondary | ICD-10-CM

## 2018-07-25 DIAGNOSIS — R1084 Generalized abdominal pain: Secondary | ICD-10-CM

## 2018-07-25 LAB — BASIC METABOLIC PANEL
Anion gap: 10 (ref 5–15)
BUN: 7 mg/dL — ABNORMAL LOW (ref 8–23)
CHLORIDE: 106 mmol/L (ref 98–111)
CO2: 29 mmol/L (ref 22–32)
CREATININE: 0.91 mg/dL (ref 0.61–1.24)
Calcium: 8.8 mg/dL — ABNORMAL LOW (ref 8.9–10.3)
GFR calc Af Amer: >60 mL/min (ref >60)
GFR calc non Af Amer: >60 mL/min (ref >60)
Glucose, Bld: 187 mg/dL — ABNORMAL HIGH (ref 70–99)
Potassium: 3.1 mmol/L — ABNORMAL LOW (ref 3.5–5.1)
Sodium: 145 mmol/L (ref 135–145)

## 2018-07-25 LAB — GLUCOSE, CAPILLARY
Glucose-Capillary: 155 mg/dL — ABNORMAL HIGH (ref 70–99)
Glucose-Capillary: 167 mg/dL — ABNORMAL HIGH (ref 70–99)
Glucose-Capillary: 180 mg/dL — ABNORMAL HIGH (ref 70–99)
Glucose-Capillary: 189 mg/dL — ABNORMAL HIGH (ref 70–99)

## 2018-07-25 LAB — MAGNESIUM: Magnesium: 2 mg/dL (ref 1.7–2.4)

## 2018-07-25 MED ORDER — SODIUM CHLORIDE 0.9 % IV SOLN
INTRAVENOUS | Status: DC | PRN
  Administered 2018-07-25: 250 mL via INTRAVENOUS

## 2018-07-25 MED ORDER — POTASSIUM CHLORIDE 10 MEQ/100ML IV SOLN
10.0000 meq | INTRAVENOUS | Status: AC
  Administered 2018-07-25 (×2): 10 meq via INTRAVENOUS
  Filled 2018-07-25: qty 100

## 2018-07-25 MED ORDER — POTASSIUM CHLORIDE 10 MEQ/100ML IV SOLN
10.0000 meq | INTRAVENOUS | Status: AC
  Administered 2018-07-25 (×2): 10 meq via INTRAVENOUS
  Filled 2018-07-25 (×3): qty 100

## 2018-07-25 MED ORDER — ADULT MULTIVITAMIN W/MINERALS CH
1.0000 | ORAL_TABLET | Freq: Every day | ORAL | Status: DC
  Administered 2018-07-25 – 2018-07-27 (×3): 1 via ORAL
  Filled 2018-07-25 (×3): qty 1

## 2018-07-25 MED ORDER — LOSARTAN POTASSIUM 50 MG PO TABS
25.0000 mg | ORAL_TABLET | Freq: Every day | ORAL | Status: DC
  Administered 2018-07-25: 25 mg via ORAL
  Filled 2018-07-25: qty 1

## 2018-07-25 NOTE — Progress Notes (Signed)
Nutrition Follow-up  DOCUMENTATION CODES:   Morbid obesity  INTERVENTION:   -MVI with minerals daily  NUTRITION DIAGNOSIS:   Inadequate oral intake related to lethargy/confusion as evidenced by meal completion < 50%, per patient/family report.  Ongoing  GOAL:   Patient will meet greater than or equal to 90% of their needs  Progressing  MONITOR:   PO intake, Supplement acceptance, Labs, Weight trends, Skin, I & O's  REASON FOR ASSESSMENT:   Low Braden    ASSESSMENT:   Rosalyn GessLawrence Reiling is a 84 y.o. male with medical history significant for diabetes mellitus recent stroke hypertension GERD, who was brought by ambulance from home because of sudden onset of nausea and vomiting around lunchtime yesterday family reported patient had been less active than usual seem more confused and tired and sleeping a lot.  He had complained of an abdominal pain earlier but not recently.  He stated that he does not usually usually complain a lot.  Do think he may have also have some fever and they were concerned about possible infection.  He does have a little bit of cough with a little congestion he denied any diarrhea.    12/15- HIDA scan positive for cholecystitis  12/16- s/p percutaneous cholecystostomy  12/17- sp neurology evaluation- pt with acute ischemic stroke- punctate infarctions 12/19- rapid response due to LOC; s/p BSE- recommend NPO 12/21- repeat BSE- recommend continued NPO 12/22- repeat BSE- recommend continued NPO; recommending palliative care consult 12/24- s/p repeat BSE- advanced to carb modified diet  Reviewed I/O's: +445 ml x 24 hours and +4.1 L since admission  Per SLP note, pt has been advanced to a carb modified diet with family option to elect for softer textured foods if warranted. Palliative care consult pending for goals of care. Per MD notes, likely d/c home within the next 24-48 hours.   Labs reviewed: CBGS: 155-190, K: 3.1.   Diet Order:   Diet Order        Diet Carb Modified Fluid consistency: Thin; Room service appropriate? Yes  Diet effective now              EDUCATION NEEDS:   Education needs have been addressed  Skin:  Skin Assessment: Skin Integrity Issues: Skin Integrity Issues:: Incisions, Diabetic Ulcer Diabetic Ulcer: lt toe Incisions: closed abdomen  Last BM:  07/22/18  Height:   Ht Readings from Last 1 Encounters:  07/15/18 6\' 2"  (1.88 m)    Weight:   Wt Readings from Last 1 Encounters:  07/15/18 (!) 147.4 kg    Ideal Body Weight:  86.4 kg  BMI:  Body mass index is 41.73 kg/m.  Estimated Nutritional Needs:   Kcal:  2000-2200  Protein:  95-110 grams  Fluid:  2.0-2.2 L    Wayne Brunker A. Mayford KnifeWilliams, RD, LDN, CDE Pager: [phone] After hours Pager: [phone]

## 2018-07-25 NOTE — Evaluation (Signed)
Speech Language Pathology Evaluation Patient Details Name: Tom Wang MRN: [id number] DOB: [birth year] Today's Date: 07/25/2018 Time: 1053-1100 SLP Time Calculation (min) (ACUTE ONLY): 7 min  Problem List:  Patient Active Problem List   Diagnosis Date Noted  . Gall stone in bile duct with infection of gallbladder 07/15/2018  . Nephrolithiasis 07/15/2018  . Acute CVA (cerebrovascular accident) (HCC) 06/14/2018  . Somnolence   . TIA (transient ischemic attack) 03/23/2018  . DM type 2 (diabetes mellitus, type 2) (HCC) 03/23/2018  . H/O: CVA (cerebrovascular accident) 03/23/2018  . Left hemiparesis (HCC) 03/23/2018  . Chronic anticoagulation- for h/o LLE DVT, on Eliquis. 03/23/2018  . Gastroesophageal reflux disease without esophagitis 08/29/2016  . Essential hypertension 08/29/2016  . Hyperlipidemia 08/29/2016   Past Medical History:  Past Medical History:  Diagnosis Date  . Arthritis    ankles  . Diabetes mellitus    oral meds only  . GERD (gastroesophageal reflux disease)   . Hypertension   . Pneumonia    20 years ago .  Marland Kitchen. Stroke [facility](HCC)    Past Surgical History:  Past Surgical History:  Procedure Laterality Date  . CATARACT EXTRACTION W/PHACO  04/26/2012   Procedure: CATARACT EXTRACTION PHACO AND INTRAOCULAR LENS PLACEMENT (IOC);  Surgeon: Chalmers Guestoy Whitaker, MD;  [facility];  Service: Ophthalmology;  Laterality: Right;  . IR PERC CHOLECYSTOSTOMY  07/17/2018  . TONSILLECTOMY    . TUMOR EXCISION     60 years ago .. right forearm .    HPI:  84 year old male admitted 07/15/18 with nausea/vomiting and abdominal pain. Rapid response 07/20/18. PMH: DM, GERD, HTN, Left Bell's Palsy. MRI - Scattered small acute infarcts in the left PCA territory, Expected evolution of the November left thalamic and left pontine infarcts   Assessment / Plan / Recommendation Clinical Impression  Pt has recent history of cognitive impairments and dysarthria following stroke in 06/2018.  Granddaughter reports his speech intelligibility "comes and goes" and cognition "seems pretty much the same." Oriented to self, place and parital for situation. Speech intelligibility in sentences is decreased and continues to need interittent cues for sustained attention and awareness. Problem solving and memory do not appear significantly changed from baseline. ST will follow for dysarthria.     SLP Assessment  SLP Recommendation/Assessment: Patient needs continued Speech Lanaguage Pathology Services SLP Visit Diagnosis: Cognitive communication deficit (R41.841)    Follow Up Recommendations  None    Frequency and Duration min 1 x/week  2 weeks      SLP Evaluation Cognition  Overall Cognitive Status: History of cognitive impairments - at baseline Arousal/Alertness: Awake/alert Orientation Level: Oriented to person;Oriented to place;Other (comment)(partially to situation ) Attention: Sustained Sustained Attention: Impaired Sustained Attention Impairment: Verbal basic Memory: Impaired Awareness: Impaired Awareness Impairment: Anticipatory impairment;Emergent impairment Problem Solving: Impaired Problem Solving Impairment: Functional basic Safety/Judgment: Impaired       Comprehension  Auditory Comprehension Overall Auditory Comprehension: Impaired at baseline Visual Recognition/Discrimination Discrimination: Not tested Reading Comprehension Reading Status: Not tested    Expression Expression Primary Mode of Expression: Verbal Verbal Expression Overall Verbal Expression: Appears within functional limits for tasks assessed Initiation: No impairment Level of Generative/Spontaneous Verbalization: Phrase Repetition: (NT) Naming: Not tested Pragmatics: Impairment Impairments: Eye contact Interfering Components: Attention Written Expression Dominant Hand: Right Written Expression: Not tested   Oral / Motor  Oral Motor/Sensory Function Overall Oral Motor/Sensory Function:  Moderate impairment Facial ROM: Reduced left;Suspected CN VII (facial) dysfunction Facial Symmetry: Abnormal symmetry left;Suspected CN VII (facial) dysfunction Facial Strength:  Reduced left;Suspected CN VII (facial) dysfunction Motor Speech Overall Motor Speech: Impaired at baseline Respiration: Impaired Level of Impairment: Conversation Phonation: Normal Resonance: Within functional limits Articulation: Within functional limitis Intelligibility: Intelligibility reduced Word: 75-100% accurate Phrase: 50-74% accurate Sentence: 25-49% accurate Motor Planning: Witnin functional limits   GO                    Royce MacadamiaLitaker, Naviah Belfield Willis 07/25/2018, 11:13 AM  Breck CoonsLisa Willis Lonell FaceLitaker M.Ed Nurse, children'sCCC-SLP Speech-Language Pathologist Pager [phone] Office [phone]

## 2018-07-25 NOTE — Progress Notes (Signed)
Patient granddaughter Tom Wang  Asked that patient not be awaken to be changed at six this morning.She stated patient needs to get some rest.

## 2018-07-25 NOTE — Progress Notes (Signed)
Referring Physician(s): Dr Barnie DelG Krishnan Dr Warren Lacy Cornett  Supervising Physician: Ruel FavorsShick, Trevor  [facility] Status:  [facility]  Chief Complaint:  Perc chole drain placed 12/16  Subjective:  Intact OP bile More alert  Allergies: Morphine and related; Other; Gabapentin; Insulins; Lantus [insulin glargine]; and Penicillins  Medications: Prior to Admission medications   Medication Sig Start Date End Date Taking? Authorizing Provider  acetaminophen (TYLENOL) 500 MG tablet Take 500 mg by mouth daily.    Yes [provider]  Ascorbic Acid (VITAMIN C PO) Take 1 tablet by mouth daily.   Yes [provider]  aspirin 325 MG tablet Take 1 tablet (325 mg total) by mouth daily. 06/17/18  Yes Mikhail, Nita SellsMaryann, DO  atorvastatin (LIPITOR) 40 MG tablet Take 1 tablet (40 mg total) by mouth daily at 6 PM. 06/17/18  Yes Mikhail, [place]ann, DO  cholecalciferol (VITAMIN D) 1000 UNITS tablet Take 500 Units by mouth at bedtime.    Yes [provider]  Cyanocobalamin (VITAMIN B-12 PO) Take 1 tablet by mouth daily.   Yes [provider]  glipiZIDE (GLUCOTROL) 10 MG tablet Take 10 mg by mouth 2 (two) times daily before a meal.   Yes [provider]  losartan (COZAAR) 25 MG tablet Take 0.5 tablets (12.5 mg total) by mouth daily. 06/22/18 07/22/18 Yes Mikhail, [place] BendMaryann, DO  Misc Natural Products (TURMERIC CURCUMIN) CAPS Take 1 capsule by mouth daily.    Yes [provider]  omeprazole (PRILOSEC) 20 MG capsule Take 20 mg by mouth daily.   Yes [provider]  senna-docusate (SENOKOT-S) 8.6-50 MG tablet Take 2 tablets by mouth daily.   Yes [provider]     Vital Signs: BP (!) 179/73 (BP Location: Right Arm)   Pulse 66   Temp 98.2 F (36.8 C) (Oral)   Resp 19   Ht 6\' 2"  (1.88 m)   Wt (!) 325 lb (147.4 kg)   SpO2 96%   BMI 41.73 kg/m   Physical Exam Vitals signs reviewed.  Abdominal:     General: Bowel sounds are normal. There  is no distension.     Palpations: Abdomen is soft.     Tenderness: There is no abdominal tenderness.  Skin:    General: Skin is warm and dry.     Comments: Site is clean and dry NT No bleeding OP Bile--milky 70-80 cc daily     Imaging: Dg Chest Port 1 View  Result Date: 07/23/2018 CLINICAL DATA:  Pneumonia, history hypertension, diabetes mellitus EXAM: PORTABLE CHEST 1 VIEW COMPARISON:  Portable exam 0948 hours compared to 07/21/2018 FINDINGS: Wall heart size and pulmonary vascularity. Atherosclerotic calcification aorta. Persistent RIGHT pleural effusion. Persistent opacity at RIGHT base which could represent atelectasis and/or consolidation. Remaining lungs clear. No pleural effusion or pneumothorax. Bones demineralized. Pigtail drainage catheter in RIGHT upper quadrant. IMPRESSION: Persistent RIGHT pleural effusion and basilar atelectasis and/or consolidation. Electronically Signed   By: Ulyses SouthwardMark  Boles M.D.   On: 07/23/2018 10:02    Labs:  CBC: Recent Labs    07/20/18 0336 07/21/18 0113 07/22/18 0429 07/24/18 0152  WBC 14.0* 13.3* 11.2* 9.9  HGB 12.3* 12.3* 11.2* 11.1*  HCT 37.6* 38.3* 36.5* 34.9*  PLT 191 204 230 286    COAGS: Recent Labs    03/23/18 0317 06/14/18 1648 07/17/18 1415  INR 1.11 1.00 1.06  APTT 38* 38*  --     BMP: Recent Labs    07/21/18 0113 07/22/18 0429 07/24/18 0152 07/25/18  0243  NA 142 144 147* 145  K 3.9 3.3* 2.9* 3.1*  CL 106 105 104 106  CO2 22 27 30 29   GLUCOSE 249* 201* 230* 187*  BUN 11 8 7* 7*  CALCIUM 9.0 9.0 9.0 8.8*  CREATININE 1.12 0.99 0.86 0.91  GFRNONAA NOT CALCULATED >60 >60 >60  GFRAA NOT CALCULATED >60 >60 >60    LIVER FUNCTION TESTS: Recent Labs    07/15/18 0528 07/17/18 0145 07/19/18 0222 07/20/18 0336 07/21/18 0113 07/22/18 0429  BILITOT 0.9 0.7 0.7  --  1.1  --   AST 15 15 18   --  30  --   ALT 10 12 17   --  23  --   ALKPHOS 63 70 67  --  79  --   PROT 7.5 6.8 6.0*  --  6.3*  --   ALBUMIN 3.5 2.7*  2.4* 2.5* 2.4* 2.1*    Assessment and Plan:  Percutaneous chole drain intact 6 week follow up with IR Clinic in place Needs to flush daily 5-10 cc daily Record OP He will hear from scheduler for time and date of IR OP Clinic appt  Electronically Signed: Robet LeuPamela A Marquan Vokes, PA-C 07/25/2018, 8:28 AM   I spent a total of 15 Minutes at the the patient's bedside AND on the patient's hospital floor or unit, greater than 50% of which was counseling/coordinating care for perc chole drain

## 2018-07-25 NOTE — Progress Notes (Addendum)
[facility] PROGRESS NOTE  Tom GessLawrence Wang ZOX:[id number] DOB: 06/19/1925 DOA: 1Apr 09, 202019  [facility]  Brief History/Interval Summary: 84 year old male with past medical history of Prior TIAs, history of encephalopathy, diabetes mellitus type 2, HTN, HLD, reflux, iron deficiency anemia, glaucoma, DVT status post chronic anticoagulation with Eliquis, CVA 2014 with residual left hemiparesis. Recent admission 1113-11/16 with acute CVA left-sided. Readmitted on 1Apr 09, 202019 with generalized abdominal pain found to have cholecystitis with large stone in gallbladder neck 4 cm. General surgery consulted-not felt to be a surgical candidate.  He underwent a percutaneous cholecystostomy drain placement. Hospitalization complicated by metabolic encephalopathy and increasing confusion ultimately MRI done found to have a stroke.  Reason for Visit: Acute encephalopathy likely metabolic  Consultants: Neurology.  General surgery.  Palliative medicine  Procedures:  Echocardiogram Study Conclusions  - Left ventricle: Moderate basal septal hypertrophy. The cavity   size was normal. Systolic function was vigorous. The estimated   ejection fraction was in the range of 65% to 70%. Doppler   parameters are consistent with both elevated ventricular   end-diastolic filling pressure and elevated left atrial filling   pressure. - Left atrium: The atrium was mildly dilated.  Antibiotics: Ceftriaxone and metronidazole  Subjective/Interval History: Patient seems to be much more responsive this morning.  Opens eyes.  He even said hello to me.  But does not really answer any questions.  His granddaughter is at the bedside.    ROS: Unable to do due to his encephalopathy  Objective:  Vital Signs  Vitals:   07/24/18 2013 07/24/18 2142 07/25/18 0004 07/25/18 0600  BP: (!) 168/74 (!) 178/76 (!) 173/68 (!) 179/73  Pulse: 75 68  66  Resp:    19  Temp:  98.5 F (36.9 C)  98.2 F (36.8  C)  TempSrc:  Oral  Oral  SpO2: 99% 99%  96%  Weight:      Height:        Intake/Output Summary (Last 24 hours) at 07/25/2018 0841 Last data filed at 07/25/2018 0600 Gross per 24 hour  Intake 515 ml  Output 70 ml  Net 445 ml   Filed Weights   07/15/18 0454  Weight: (!) 147.4 kg    General appearance: More awake and alert today. Resp: Normal effort at rest.  Clear to auscultation bilaterally. Cardio: S1-S2 is normal regular.  No S3-S4.  No rubs murmurs or bruit GI: Abdomen is soft.  Nontender nondistended Extremities: Mild edema upper and lower extremity Neurologic: More responsive today.  No obvious focal neurological deficits  Lab Results:  Data Reviewed: I have personally reviewed following labs and imaging studies  CBC: Recent Labs  Lab 07/19/18 0222 07/20/18 0336 07/21/18 0113 07/22/18 0429 07/24/18 0152  WBC 14.0* 14.0* 13.3* 11.2* 9.9  NEUTROABS  --  10.6* 9.0* 7.5 6.3  HGB 11.5* 12.3* 12.3* 11.2* 11.1*  HCT 35.5* 37.6* 38.3* 36.5* 34.9*  MCV 80.3 79.3* 80.5 80.6 82.3  PLT 169 191 204 230 286    Basic Metabolic Panel: Recent Labs  Lab 07/20/18 0336 07/21/18 0113 07/22/18 0429 07/24/18 0152 07/25/18 0243  NA 140 142 144 147* 145  K 3.3* 3.9 3.3* 2.9* 3.1*  CL 105 106 105 104 106  CO2 26 22 27 30 29   GLUCOSE 252* 249* 201* 230* 187*  BUN 8 11 8  7* 7*  CREATININE 0.84 1.12 0.99 0.86 0.91  CALCIUM 9.2 9.0 9.0 9.0 8.8*  MG  --  2.1  --   --   --  PHOS 2.3*  --  2.8  --   --     GFR: Estimated Creatinine Clearance: 82.6 mL/min (by C-G formula based on SCr of 0.91 mg/dL).  Liver Function Tests: Recent Labs  Lab 07/19/18 0222 07/20/18 0336 07/21/18 0113 07/22/18 0429  AST 18  --  30  --   ALT 17  --  23  --   ALKPHOS 67  --  79  --   BILITOT 0.7  --  1.1  --   PROT 6.0*  --  6.3*  --   ALBUMIN 2.4* 2.5* 2.4* 2.1*     Recent Labs  Lab 07/20/18 1048  AMMONIA 25     CBG: Recent Labs  Lab 07/23/18 2141 07/24/18 0832  07/24/18 1248 07/24/18 1745 07/24/18 2145  GLUCAP 238* 184* 174* 190* 187*      Recent Results (from the past 240 hour(s))  Urine culture     Status: None   Collection Time: 07/15/18 10:33 AM  Result Value Ref Range Status   Specimen Description URINE, RANDOM  Final   Special Requests NONE  Final   Culture   Final    NO GROWTH Performed at [facility], [address] [place], [place] [address]    Report Status 07/16/2018 FINAL  Final  Surgical PCR screen     Status: None   Collection Time: 07/16/18  4:23 AM  Result Value Ref Range Status   MRSA, PCR NEGATIVE NEGATIVE Final   Staphylococcus aureus NEGATIVE NEGATIVE Final    Comment: (NOTE) The Xpert SA Assay (FDA approved for NASAL specimens in patients 50 years of age and older), is one component of a comprehensive surveillance program. It is not intended to diagnose infection nor to guide or monitor treatment. Performed at [facility], [address], [place], [place] [address]   Aerobic/Anaerobic Culture (surgical/deep wound)     Status: None   Collection Time: 07/17/18  5:01 PM  Result Value Ref Range Status   Specimen Description GALL BLADDER BILE  Final   Special Requests NONE  Final   Gram Stain   Final    ABUNDANT WBC PRESENT, PREDOMINANTLY PMN MODERATE GRAM NEGATIVE RODS RARE GRAM VARIABLE ROD Performed at [facility], [address] [place], [place] [address]    Culture   Final    MODERATE ESCHERICHIA COLI ABUNDANT PROTEUS MIRABILIS MIXED ANAEROBIC FLORA PRESENT.  CALL LAB IF FURTHER IID REQUIRED.    Report Status 07/22/2018 FINAL  Final   Organism ID, Bacteria PROTEUS MIRABILIS  Final   Organism ID, Bacteria ESCHERICHIA COLI  Final      Susceptibility   Escherichia coli - MIC*    AMPICILLIN >=32 RESISTANT Resistant     CEFAZOLIN 8 SENSITIVE Sensitive     CEFEPIME <=1 SENSITIVE Sensitive     CEFTAZIDIME <=1 SENSITIVE Sensitive     CEFTRIAXONE <=1 SENSITIVE Sensitive      CIPROFLOXACIN <=0.25 SENSITIVE Sensitive     GENTAMICIN <=1 SENSITIVE Sensitive     IMIPENEM <=0.25 SENSITIVE Sensitive     TRIMETH/SULFA 40 SENSITIVE Sensitive     AMPICILLIN/SULBACTAM 8 SENSITIVE Sensitive     PIP/TAZO <=4 SENSITIVE Sensitive     Extended ESBL NEGATIVE Sensitive     * MODERATE ESCHERICHIA COLI   Proteus mirabilis - MIC*    AMPICILLIN <=2 SENSITIVE Sensitive     CEFAZOLIN <=4 SENSITIVE Sensitive     CEFEPIME <=1 SENSITIVE Sensitive     CEFTAZIDIME <=  1 SENSITIVE Sensitive     CEFTRIAXONE <=1 SENSITIVE Sensitive     CIPROFLOXACIN >=4 RESISTANT Resistant     GENTAMICIN <=1 SENSITIVE Sensitive     IMIPENEM 2 SENSITIVE Sensitive     TRIMETH/SULFA <=20 SENSITIVE Sensitive     AMPICILLIN/SULBACTAM <=2 SENSITIVE Sensitive     PIP/TAZO <=4 SENSITIVE Sensitive     * ABUNDANT PROTEUS MIRABILIS      Radiology Studies: Dg Chest Port 1 View  Result Date: 07/23/2018 CLINICAL DATA:  Pneumonia, history hypertension, diabetes mellitus EXAM: PORTABLE CHEST 1 VIEW COMPARISON:  Portable exam 0948 hours compared to 07/21/2018 FINDINGS: Wall heart size and pulmonary vascularity. Atherosclerotic calcification aorta. Persistent RIGHT pleural effusion. Persistent opacity at RIGHT base which could represent atelectasis and/or consolidation. Remaining lungs clear. No pleural effusion or pneumothorax. Bones demineralized. Pigtail drainage catheter in RIGHT upper quadrant. IMPRESSION: Persistent RIGHT pleural effusion and basilar atelectasis and/or consolidation. Electronically Signed   By: Ulyses Southward M.D.   On: 07/23/2018 10:02     Medications:  Scheduled: . apixaban  5 mg Oral BID  . lidocaine  1 patch Transdermal Q24H  . mouth rinse  15 mL Mouth Rinse q12n4p  . metoprolol tartrate  5 mg Intravenous Q6H  . sodium chloride flush  5 mL Intracatheter Q8H  . sorbitol  20 mL Oral Daily   Continuous: . 0.9 % NaCl with KCl 20 mEq / L 50 mL/hr at 07/24/18 1739  . cefTRIAXone (ROCEPHIN)  IV  2 g (07/24/18 0832)  . metronidazole 500 mg (07/25/18 0735)  . potassium chloride     ZOX:[id number] alcohol    Assessment/Plan:  Acute metabolic encephalopathy Likely multifactorial including hospital stay, acute infection, acute stroke.  Baseline is not clearly known but at least at home he was eating.  Patient's mentation appears to be improving slowly.  He is much more responsive today compared to how he was yesterday.  Continue to monitor for now.  We will still benefit from goals of care conversation with palliative medicine.  They have been consulted.    Acute stroke Due to persistent encephalopathy patient underwent MRI which showed multiple infarcts in the left PCA territory.  Patient seen by neurology.  Placed on Eliquis.  Possible aspiration Patient placed on ceftriaxone and metronidazole.  Patient is much more awake and alert today.  Discussed with speech therapy who will reevaluate him.  They have seen him and recommend regular consistency food.  This is an encouraging sign.  Continue to monitor.    History of constipation Has required enemas.  Continue to monitor.  Acute cholecystitis Status post cholecystostomy drain placement on 12/16.  To be followed in the drain clinic.  Fluid did grow E. Coli and Proteus.  Both are sensitive to ceftriaxone.  He has received 10 days of antibiotics for his cholecystitis.  However his aspiration pneumonia was diagnosed on 12/20.  Plan was to change to Augmentin but patient has allergy to penicillin.  Leave him on current antibiotics for now.  Once his oral intake is consistent we can make that change tomorrow.  Diabetes mellitus type 2 Holding his oral agents.  CBGs are stable.  Continue SSI.  Essential hypertension Blood pressure poorly controlled primarily because he is not able to take his oral medications.  He is on IV metoprolol every 6 hours as needed.  Since he is now able to take orally we will put him back on his oral  antihypertensives.  History of GERD Can resume his oral  medications  History of hyperlipidemia Resume his home medications  Normocytic anemia Hemoglobin is stable.  No evidence of overt bleeding.  Hypokalemia Potassium better than yesterday but still low.  He will be given additional supplements.  Magnesium is normal.   DVT Prophylaxis: Apixaban    Code Status: Full code Family Communication: Discussed with patient and granddaughter today Disposition Plan: Management as outlined above.  Palliative medicine consult is pending.  Watch him closely while he is a started on a diet.  Hopefully home in the next 24 to 48 hours.    LOS: 10 days   Osvaldo ShipperGokul Marilyn Wing  [facility] Pager [phone] 07/25/2018, 8:41 AM  If 7PM-7AM, please contact night-coverage at [email or web address], password [email or web address]

## 2018-07-25 NOTE — Progress Notes (Signed)
  Speech Language Pathology Treatment: Dysphagia  Patient Details Name: Tom Wang MRN: [id number] DOB: 1925-07-20 Today's Date: 07/25/2018 Time: [id number] SLP Time Calculation (min) (ACUTE ONLY): 24 min  Assessment / Plan / Recommendation Clinical Impression  Pt awake this morning and sitting in chair with granddaughter present. Verbally responsive to questions given additional time to respond, following commands. Labial leakage with thin given general weakness. Over multiple trials thin with and without straw, puree and solid pt exhibited one delayed cough. Vocal quality strong and clear. He is at risk for silent aspiration given pontine location of stroke. Options explained to granddaughter who requested to initiate po's without instrumental assessment understanding risks. Will appropriately begin thin liquids and regular texture with family ordering softer textures for meals. Will follow briefly x 1 for further education.   HPI HPI: 84 year old male admitted 07/15/18 with nausea/vomiting and abdominal pain. Rapid response 07/20/18. PMH: DM, GERD, HTN, Left Bell's Palsy. MRI - Scattered small acute infarcts in the left PCA territory, Expected evolution of the November left thalamic and left pontine infarcts      SLP Plan  Continue with current plan of care       Recommendations  Diet recommendations: Regular;Thin liquid(family to order softer foods) Liquids provided via: Cup;No straw Medication Administration: Crushed with puree Supervision: Staff to assist with self feeding;Full supervision/cueing for compensatory strategies Compensations: Minimize environmental distractions;Slow rate;Small sips/bites;Lingual sweep for clearance of pocketing Postural Changes and/or Swallow Maneuvers: Seated upright 90 degrees;Upright 30-60 min after meal                Oral Care Recommendations: Oral care BID Follow up Recommendations: None SLP Visit Diagnosis: Dysphagia, oral phase  (R13.11) Plan: Continue with current plan of care       GO                Royce MacadamiaLitaker, Mistie Adney Willis 07/25/2018, 10:47 AM  Breck CoonsLisa Willis Lonell FaceLitaker M.Ed Nurse, children'sCCC-SLP Speech-Language Pathologist Pager [phone] Office [phone]

## 2018-07-25 NOTE — Consult Note (Signed)
Consultation Note Date: 07/25/18  Patient Name: Tom Wang  DOB: 1924/08/23  MRN: [id number]  Age / Sex: 84 y.o., male  PCP: [facility] Referring Physician: Bonnielee Haff, MD  Reason for Consultation: Establishing goals of care  HPI/Patient Profile: 84 y.o. 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 Patient found to have cholecystitis with large 4cm stone in gallbladder neck. Per surgery, not a good surgical candidate. IR placed percutaneous cholecystostomy drain. Hospitalization complicated by metabolic encephalopathy/lethargy. MRI revealed new stroke. Palliative medicine consultation for goals of care.   Clinical Assessment and Goals of Care:  I have reviewed medical records, discussed with care team, and met with patient and granddaughter Velna Hatchet) at bedside to discuss diagnosis, [place], EOL wishes, disposition and options.  Introduced Palliative Medicine as specialized medical care for people living with serious illness. It focuses on providing relief from the symptoms and stress of a serious illness. The goal is to improve quality of life for both the patient and the family.  We discussed a brief life review of the patient. Velna Hatchet describes Mr. Balogh as active prior to his stroke in 2014. After this stroke, he experienced left-sided weakness. He has not walked since. Wheelchair bound. Patient was living at [facility] until [place] and her mother Kizzie Fantasia) made a plan to care for him at home. They are with him 24/7.   Discussed events leading up to admission and course of hospitalization including diagnoses and interventions. Velna Hatchet shares his changes in cognitive status since admission. She is  relieved that today is a better day and he was able to pass speech swallow evaluation. He is more awake and communicating with her and care team today.   I attempted to elicit values and goals of care important to the patient. Most important goal for Brandi and her mother is to get Mr. Slagel back home. They do not want him to go to SNF for rehab. They are able to provide 24 hour care. [facility] including lift so they are able to get him from bed to chair. He spend majority of the day sitting up in the chair.   Advanced directives, concepts specific to code status, artifical feeding and hydration, and rehospitalization were considered and discussed. Lisa Roca are patient's HCPOA's. Document reviewed in epic. Velna Hatchet shares that in the past her grandfather had a "DNR" but when her and her mother became involved in his care, they rescinded this DNR back to FULL code. Velna Hatchet goes on to share that her grandfather would not want to live in a "vegetative" state but they wish for "everything" to be done including resuscitation be attempted.   Introduced and discussed MOST form. Encouraged Brandi to discuss with her mother and consider limitations to care with underlying age, frailty, and co-morbidities. We did discuss high risk  for recurrent strokes. Velna Hatchet continues on to share that her and her mother will continue to "fight" for Mr. Garron until they hear that he no longer wishes to fight or is tired. Hard Choices booklet given for review.   Velna Hatchet is hopeful to get her grandfather home by his birthday, [birth year]. He will be 89.   Questions and concerns were addressed. PMT contact information given.    SUMMARY OF RECOMMENDATIONS    Patient's living will reviewed in epic. Granddaughter Velna Hatchet) and daughter Kizzie Fantasia) are documented HCPOA's.  Velna Hatchet confirms wishes for FULL code/FULL scope treatment. She does acknowledge that her grandfather would  not wish to live in a "vegetative" state but at this time, wishes for FULL scope including resuscitation to be attempted if necessary. Introduced MOST form and encouraged further discussions regarding limitations to care with underlying age, frailty, and co-morbidities.   Most important goal is for family to take patient back home. They are able to provide 24 hour care. Granddaughter hopeful for continued improvement now that he has passes SLP evaluation.   May benefit from outpatient palliative referral for ongoing [place] discussions.   Code Status/Advance Care Planning:  Full code  Symptom Management:   Per attending  Palliative Prophylaxis:   Aspiration, Delirium Protocol, Oral Care and Turn Reposition  Additional Recommendations (Limitations, Scope, Preferences):  Full Scope Treatment  Psycho-social/Spiritual:   Desire for further Chaplaincy support: yes  Additional Recommendations: Caregiving  Support/Resources and Education on Hospice  Prognosis:   Unable to determine  Discharge Planning: To Be Determined      Primary Diagnoses: Present on Admission: . Essential hypertension . Hyperlipidemia . Somnolence . Gall stone in bile duct with infection of gallbladder   I have reviewed the medical record, interviewed the patient and family, and examined the patient. The following aspects are pertinent.  Past Medical History:  Diagnosis Date  . Arthritis    ankles  . Diabetes mellitus    oral meds only  . GERD (gastroesophageal reflux disease)   . Hypertension   . Pneumonia    20 years ago .  [facility])    Social History   Socioeconomic History  . Marital status: Widowed    Spouse name: Not on file  . Number of children: Not on file  . Years of education: Not on file  . Highest education level: Not on file  Occupational History  . Not on file  Social Needs  . Financial resource strain: Not on file  . Food insecurity:    Worry: Not on file     Inability: Not on file  . Transportation needs:    Medical: Not on file    Non-medical: Not on file  Tobacco Use  . Smoking status: Never Smoker  . Smokeless tobacco: Never Used  Substance and Sexual Activity  . Alcohol use: No  . Drug use: No  . Sexual activity: Never  Lifestyle  . Physical activity:    Days per week: Not on file    Minutes per session: Not on file  . Stress: Not on file  Relationships  . Social connections:    Talks on phone: Not on file    Gets together: Not on file    Attends religious service: Not on file    Active member of club or organization: Not on file    Attends meetings of clubs or organizations: Not on file    Relationship status: Not on file  Other Topics Concern  .  Not on file  Social History Narrative  . Not on file   Family History  Problem Relation Age of Onset  . Other Grandchild    Scheduled Meds: . apixaban  5 mg Oral BID  . lidocaine  1 patch Transdermal Q24H  . losartan  25 mg Oral Daily  . metoprolol tartrate  5 mg Intravenous Q6H  . multivitamin with minerals  1 tablet Oral Daily  . sodium chloride flush  5 mL Intracatheter Q8H  . sorbitol  20 mL Oral Daily   Continuous Infusions: . sodium chloride Stopped (07/25/18 1540)  . 0.9 % NaCl with KCl 20 mEq / L 50 mL/hr at 07/26/18 0300  . cefTRIAXone (ROCEPHIN)  IV 200 mL/hr at 07/25/18 0907  . metronidazole Stopped (07/26/18 0152)   PRN Meds:.sodium chloride, polyvinyl alcohol Medications Prior to Admission:  Prior to Admission medications   Medication Sig Start Date End Date Taking? Authorizing Provider  acetaminophen (TYLENOL) 500 MG tablet Take 500 mg by mouth daily.    Yes [provider]  Ascorbic Acid (VITAMIN C PO) Take 1 tablet by mouth daily.   Yes [provider]  aspirin 325 MG tablet Take 1 tablet (325 mg total) by mouth daily. 06/17/18  Yes Mikhail, Velta Addison, DO  atorvastatin (LIPITOR) 40 MG tablet Take 1 tablet (40 mg total) by mouth daily at  6 PM. 06/17/18  Yes Mikhail, [place], DO  cholecalciferol (VITAMIN D) 1000 UNITS tablet Take 500 Units by mouth at bedtime.    Yes [provider]  Cyanocobalamin (VITAMIN B-12 PO) Take 1 tablet by mouth daily.   Yes [provider]  glipiZIDE (GLUCOTROL) 10 MG tablet Take 10 mg by mouth 2 (two) times daily before a meal.   Yes [provider]  losartan (COZAAR) 25 MG tablet Take 0.5 tablets (12.5 mg total) by mouth daily. 06/22/18 07/22/18 Yes Mikhail, [place], DO  Misc Natural Products (TURMERIC CURCUMIN) CAPS Take 1 capsule by mouth daily.    Yes [provider]  omeprazole (PRILOSEC) 20 MG capsule Take 20 mg by mouth daily.   Yes [provider]  senna-docusate (SENOKOT-S) 8.6-50 MG tablet Take 2 tablets by mouth daily.   Yes [provider]   Allergies  Allergen Reactions  . Morphine And Related Other (See Comments)    SEVERE REACTION  . Other Other (See Comments)    NO NARCOTICS PER FAMILY  . Gabapentin Other (See Comments)    AGITATION, COMBATIVE  . Insulins Other (See Comments)    Stroke like symptoms per family  . Lantus [Insulin Glargine] Other (See Comments)    unknown  . Penicillins Other (See Comments)    UNKNOWN   Review of Systems  Unable to perform ROS: Mental status change    Physical Exam Vitals signs and nursing note reviewed.  HENT:     Head: Normocephalic and atraumatic.  Cardiovascular:     Rate and Rhythm: Normal rate.  Pulmonary:     Effort: No tachypnea, accessory muscle usage or respiratory distress.  Abdominal:     Tenderness: There is no abdominal tenderness.  Neurological:     Mental Status: He is easily aroused.     Comments: Wakes to voice. Oriented to person and place.  Psychiatric:        Attention and Perception: He is inattentive.        Speech: Speech is delayed.     Comments: HOH    Vital Signs: BP (!) 172/66 (BP Location:  Right Arm)   Pulse 62   Temp 97.8 F (36.6 C) (Oral)    Resp 18   Ht _0  (1.88 m)   Wt (!) 147.4 kg   SpO2 95%   BMI 41.73 kg/m  Pain Scale: 0-10   Pain Score: 0-No pain   SpO2: SpO2: 95 % O2 Device:SpO2: 95 % O2 Flow Rate: .O2 Flow Rate (L/min): 2 L/min  IO: Intake/output summary:   Intake/Output Summary (Last 24 hours) at 07/26/2018 0738 Last data filed at 07/26/2018 0300 Gross per 24 hour  Intake 2039.2 ml  Output 50 ml  Net 1989.2 ml    LBM: Last BM Date: 07/21/18 Baseline Weight: Weight: (!) 147.4 kg Most recent weight: Weight: (!) 147.4 kg     Palliative Assessment/Data: PPS 40%   Flowsheet Rows     Most Recent Value  Intake Tab  Referral Department  Hospitalist  Unit at Time of Referral  Med/Surg Unit  Palliative Care Primary Diagnosis  Neurology  Palliative Care Type  New Palliative care  Date first seen by Palliative Care  07/25/18  Clinical Assessment  Palliative Performance Scale Score  40%  Psychosocial & Spiritual Assessment  Palliative Care Outcomes  Patient/Family meeting held?  Yes  Who was at the meeting?  granddaughter/POA  Palliative Care Outcomes  Clarified goals of care, Provided end of life care assistance, ACP counseling assistance, Linked to palliative care logitudinal support, Provided psychosocial or spiritual support      Time In: 1400 Time Out: 1510 Time Total: 70 Greater than 50%  of this time was spent counseling and coordinating care related to the above assessment and plan.  Signed by:  Ihor Dow, FNP-C Palliative Medicine Team  Phone: [phone] Fax: [phone]   Please contact Palliative Medicine Team phone at [phone] for questions and concerns.  For individual provider: See Shea Evans

## 2018-07-26 DIAGNOSIS — R1084 Generalized abdominal pain: Secondary | ICD-10-CM

## 2018-07-26 DIAGNOSIS — K8000 Calculus of gallbladder with acute cholecystitis without obstruction: Secondary | ICD-10-CM

## 2018-07-26 DIAGNOSIS — Z7189 Other specified counseling: Secondary | ICD-10-CM

## 2018-07-26 DIAGNOSIS — Z515 Encounter for palliative care: Secondary | ICD-10-CM

## 2018-07-26 LAB — BASIC METABOLIC PANEL
Anion gap: 13 (ref 5–15)
BUN: 6 mg/dL — ABNORMAL LOW (ref 8–23)
CHLORIDE: 105 mmol/L (ref 98–111)
CO2: 26 mmol/L (ref 22–32)
Calcium: 8.7 mg/dL — ABNORMAL LOW (ref 8.9–10.3)
Creatinine, Ser: 0.89 mg/dL (ref 0.61–1.24)
GFR calc Af Amer: >60 mL/min (ref >60)
GFR calc non Af Amer: >60 mL/min (ref >60)
Glucose, Bld: 176 mg/dL — ABNORMAL HIGH (ref 70–99)
Potassium: 3 mmol/L — ABNORMAL LOW (ref 3.5–5.1)
Sodium: 144 mmol/L (ref 135–145)

## 2018-07-26 LAB — GLUCOSE, CAPILLARY
GLUCOSE-CAPILLARY: 171 mg/dL — AB (ref 70–99)
GLUCOSE-CAPILLARY: 169 mg/dL — AB (ref 70–99)
Glucose-Capillary: 160 mg/dL — ABNORMAL HIGH (ref 70–99)
Glucose-Capillary: 179 mg/dL — ABNORMAL HIGH (ref 70–99)

## 2018-07-26 MED ORDER — DOCUSATE SODIUM 100 MG PO CAPS
100.0000 mg | ORAL_CAPSULE | Freq: Two times a day (BID) | ORAL | Status: DC
  Administered 2018-07-26 – 2018-07-27 (×3): 100 mg via ORAL
  Filled 2018-07-26 (×3): qty 1

## 2018-07-26 MED ORDER — POLYETHYLENE GLYCOL 3350 17 G PO PACK
17.0000 g | PACK | Freq: Every day | ORAL | Status: DC
  Administered 2018-07-26 – 2018-07-27 (×2): 17 g via ORAL
  Filled 2018-07-26 (×2): qty 1

## 2018-07-26 MED ORDER — POTASSIUM CHLORIDE CRYS ER 20 MEQ PO TBCR
20.0000 meq | EXTENDED_RELEASE_TABLET | Freq: Once | ORAL | Status: AC
  Administered 2018-07-26: 20 meq via ORAL
  Filled 2018-07-26: qty 1

## 2018-07-26 MED ORDER — POTASSIUM CHLORIDE 10 MEQ/100ML IV SOLN
10.0000 meq | INTRAVENOUS | Status: AC
  Administered 2018-07-26 (×4): 10 meq via INTRAVENOUS
  Filled 2018-07-26 (×4): qty 100

## 2018-07-26 MED ORDER — METOPROLOL TARTRATE 25 MG PO TABS
25.0000 mg | ORAL_TABLET | Freq: Two times a day (BID) | ORAL | Status: DC
  Administered 2018-07-26 – 2018-07-27 (×2): 25 mg via ORAL
  Filled 2018-07-26 (×2): qty 1

## 2018-07-26 MED ORDER — AMLODIPINE BESYLATE 10 MG PO TABS
10.0000 mg | ORAL_TABLET | Freq: Every day | ORAL | Status: DC
  Administered 2018-07-26 – 2018-07-27 (×2): 10 mg via ORAL
  Filled 2018-07-26 (×2): qty 1

## 2018-07-26 MED ORDER — CEPHALEXIN 500 MG PO CAPS
500.0000 mg | ORAL_CAPSULE | Freq: Three times a day (TID) | ORAL | Status: DC
  Administered 2018-07-26 – 2018-07-27 (×4): 500 mg via ORAL
  Filled 2018-07-26 (×4): qty 1

## 2018-07-26 NOTE — Plan of Care (Signed)
  Problem: Nutrition: Goal: Adequate nutrition will be maintained Outcome: Progressing   Problem: Coping: Goal: Level of anxiety will decrease Outcome: Progressing   Problem: Pain Managment: Goal: General experience of comfort will improve Outcome: Progressing   Problem: Safety: Goal: Ability to remain free from injury will improve Outcome: Progressing   

## 2018-07-26 NOTE — Progress Notes (Signed)
[facility] PROGRESS NOTE  Tom Wang ZOX:[id number] DOB: 11-15-1924 DOA: 07/15/2018  PCP: [name]  Brief History/Interval Summary: 84 year old male with past medical history of Prior TIAs, history of encephalopathy, diabetes mellitus type 2, HTN, HLD, reflux, iron deficiency anemia, glaucoma, DVT status post chronic anticoagulation with Eliquis, CVA 2014 with residual left hemiparesis. Recent admission 1113-11/16 with acute CVA left-sided. Readmitted on 07/15/2018 with generalized abdominal pain found to have cholecystitis with large stone in gallbladder neck 4 cm. General surgery consulted-not felt to be a surgical candidate.  He underwent a percutaneous cholecystostomy drain placement. Hospitalization complicated by metabolic encephalopathy and increasing confusion ultimately MRI done, found to have a stroke.  Reason for Visit: Acute encephalopathy likely metabolic  Consultants: Neurology.  General surgery.  Palliative medicine  Procedures:  Echocardiogram Study Conclusions  - Left ventricle: Moderate basal septal hypertrophy. The cavity   size was normal. Systolic function was vigorous. The estimated   ejection fraction was in the range of 65% to 70%. Doppler   parameters are consistent with both elevated ventricular   end-diastolic filling pressure and elevated left atrial filling   pressure. - Left atrium: The atrium was mildly dilated.  Antibiotics: Ceftriaxone and metronidazole will be discontinued today Keflex 12/25  Subjective/Interval History: Patient sleepy this morning but easily arousable.  Opens his eyes.  Not very communicative.  Patient's daughter and granddaughter at the bedside.     ROS: Unable to do due to his encephalopathy  Objective:  Vital Signs  Vitals:   07/25/18 0600 07/25/18 1346 07/25/18 2130 07/26/18 0510  BP: (!) 179/73 (!) 187/61 (!) 171/65 (!) 172/66  Pulse: 66 (!) 59 61 62  Resp: 19 18 (!) 28 18  Temp: 98.2  F (36.8 C) 98.2 F (36.8 C) 97.9 F (36.6 C) 97.8 F (36.6 C)  TempSrc: Oral Oral Oral Oral  SpO2: 96% 95% 95% 95%  Weight:      Height:        Intake/Output Summary (Last 24 hours) at 07/26/2018 1120 Last data filed at 07/26/2018 0300 Gross per 24 hour  Intake 2019.2 ml  Output 50 ml  Net 1969.2 ml   Filed Weights   07/15/18 0454  Weight: (!) 147.4 kg    Somnolent but easily arousable.  Much more responsive than before Normal effort at rest.  Clear to auscultation bilaterally.  Diminished at the bases S1-S2 is normal regular.  No S3-S4.  No rubs murmurs or bruit Abdomen is soft.  Nontender nondistended Mild Edema noted upper and lower extremities No obvious focal neurological deficits.  Lab Results:  Data Reviewed: I have personally reviewed following labs and imaging studies  CBC: Recent Labs  Lab 07/20/18 0336 07/21/18 0113 07/22/18 0429 07/24/18 0152  WBC 14.0* 13.3* 11.2* 9.9  NEUTROABS 10.6* 9.0* 7.5 6.3  HGB 12.3* 12.3* 11.2* 11.1*  HCT 37.6* 38.3* 36.5* 34.9*  MCV 79.3* 80.5 80.6 82.3  PLT 191 204 230 286    Basic Metabolic Panel: Recent Labs  Lab 07/20/18 0336 07/21/18 0113 07/22/18 0429 07/24/18 0152 07/25/18 0243 07/26/18 0952  NA 140 142 144 147* 145 144  K 3.3* 3.9 3.3* 2.9* 3.1* 3.0*  CL 105 106 105 104 106 105  CO2 26 22 27 30 29 26   GLUCOSE 252* 249* 201* 230* 187* 176*  BUN 8 11 8  7* 7* 6*  CREATININE 0.84 1.12 0.99 0.86 0.91 0.89  CALCIUM 9.2 9.0 9.0 9.0 8.8* 8.7*  MG  --  2.1  --   --  2.0  --   PHOS 2.3*  --  2.8  --   --   --     GFR: Estimated Creatinine Clearance: 84.5 mL/min (by C-G formula based on SCr of 0.89 mg/dL).  Liver Function Tests: Recent Labs  Lab 07/20/18 0336 07/21/18 0113 07/22/18 0429  AST  --  30  --   ALT  --  23  --   ALKPHOS  --  79  --   BILITOT  --  1.1  --   PROT  --  6.3*  --   ALBUMIN 2.5* 2.4* 2.1*     Recent Labs  Lab 07/20/18 1048  AMMONIA 25     CBG: Recent Labs  Lab  07/25/18 0903 07/25/18 1204 07/25/18 1654 07/25/18 2132 07/26/18 0818  GLUCAP 155* 167* 180* 189* 160*      Recent Results (from the past 240 hour(s))  Aerobic/Anaerobic Culture (surgical/deep wound)     Status: None   Collection Time: 07/17/18  5:01 PM  Result Value Ref Range Status   Specimen Description GALL BLADDER BILE  Final   Special Requests NONE  Final   Gram Stain   Final    ABUNDANT WBC PRESENT, PREDOMINANTLY PMN MODERATE GRAM NEGATIVE RODS RARE GRAM VARIABLE ROD Performed at [facility], [address] [place], [place] [id number]    Culture   Final    MODERATE ESCHERICHIA COLI ABUNDANT PROTEUS MIRABILIS MIXED ANAEROBIC FLORA PRESENT.  CALL LAB IF FURTHER IID REQUIRED.    Report Status 07/22/2018 FINAL  Final   Organism ID, Bacteria PROTEUS MIRABILIS  Final   Organism ID, Bacteria ESCHERICHIA COLI  Final      Susceptibility   Escherichia coli - MIC*    AMPICILLIN >=32 RESISTANT Resistant     CEFAZOLIN 8 SENSITIVE Sensitive     CEFEPIME <=1 SENSITIVE Sensitive     CEFTAZIDIME <=1 SENSITIVE Sensitive     CEFTRIAXONE <=1 SENSITIVE Sensitive     CIPROFLOXACIN <=0.25 SENSITIVE Sensitive     GENTAMICIN <=1 SENSITIVE Sensitive     IMIPENEM <=0.25 SENSITIVE Sensitive     TRIMETH/SULFA 40 SENSITIVE Sensitive     AMPICILLIN/SULBACTAM 8 SENSITIVE Sensitive     PIP/TAZO <=4 SENSITIVE Sensitive     Extended ESBL NEGATIVE Sensitive     * MODERATE ESCHERICHIA COLI   Proteus mirabilis - MIC*    AMPICILLIN <=2 SENSITIVE Sensitive     CEFAZOLIN <=4 SENSITIVE Sensitive     CEFEPIME <=1 SENSITIVE Sensitive     CEFTAZIDIME <=1 SENSITIVE Sensitive     CEFTRIAXONE <=1 SENSITIVE Sensitive     CIPROFLOXACIN >=4 RESISTANT Resistant     GENTAMICIN <=1 SENSITIVE Sensitive     IMIPENEM 2 SENSITIVE Sensitive     TRIMETH/SULFA <=20 SENSITIVE Sensitive     AMPICILLIN/SULBACTAM <=2 SENSITIVE Sensitive     PIP/TAZO <=4 SENSITIVE Sensitive     * ABUNDANT PROTEUS MIRABILIS        Radiology Studies: No results found.   Medications:  Scheduled: . amLODipine  10 mg Oral Daily  . apixaban  5 mg Oral BID  . cephALEXin  500 mg Oral Q8H  . lidocaine  1 patch Transdermal Q24H  . metoprolol tartrate  25 mg Oral BID  . multivitamin with minerals  1 tablet Oral Daily  . potassium chloride  20 mEq Oral Once  . potassium chloride  20 mEq Oral Once  . sodium chloride flush  5 mL Intracatheter Q8H  . sorbitol  20 mL Oral Daily   Continuous: . sodium chloride Stopped (07/25/18 1540)  . 0.9 % NaCl with KCl 20 mEq / L 50 mL/hr at 07/26/18 0300  . potassium chloride     KVQ:QVZDGLPRN:sodium chloride, polyvinyl alcohol    Assessment/Plan:  Acute metabolic encephalopathy Likely multifactorial including hospital stay, acute infection, acute stroke.  Baseline is not clearly known but at least at home he was eating.  Patient's mentation has improved.  He was seen by speech therapy yesterday who cleared him for oral intake.  Seen by palliative medicine.  Appreciate their assistance.  Family wants patient to be full code for now.     Acute stroke Due to persistent encephalopathy patient underwent MRI which showed multiple infarcts in the left PCA territory.  Patient seen by neurology.  Placed on Eliquis.  Patient noted to have dysphagia.  Since his mentation improved he was cleared for oral intake by speech therapy.  Family appears to be pleased with this.  Aspiration pneumonia Patient found to have right-sided consolidation and small pleural effusion.  Patient placed on ceftriaxone and metronidazole.  We will change to oral Keflex today.  Patient noted to be allergic to penicillin.  Now that he is on oral diet aspiration precautions need to be followed.  History of constipation Has required enemas.  Per family has not had a bowel movement in many days.  Likely because of poor oral intake.  Will put him on stool softeners and laxatives.  Acute cholecystitis Status post  cholecystostomy drain placement on 12/16.  To be followed in the drain clinic.  Fluid did grow E. Coli and Proteus.  Both were sensitive to ceftriaxone.  He has received 10 days of antibiotics for his cholecystitis.  He remains afebrile.  Diabetes mellitus type 2 Holding his oral agents.  CBGs are stable.  Continue SSI.  Essential hypertension Blood pressure poorly controlled primarily because he is not able to take his oral medications.  Losartan however family is requesting a change in his blood pressure medication.  He will be switched over to amlodipine.  He will be switched over from IV metoprolol to oral.  Monitor blood pressures closely.   History of GERD Can resume his oral medications  History of hyperlipidemia Resume his home medications  Normocytic anemia Hemoglobin is stable.  No evidence of overt bleeding.  Hypokalemia Potassium remains low.  Magnesium was 2.0 yesterday.  Additional doses of potassium to be given today.  He might benefit from a daily supplement.    DVT Prophylaxis: Apixaban    Code Status: Full code Family Communication: Discussed with patient and granddaughter today Disposition Plan: Management as outlined above.  Tolerating his diet.  Hopefully home in the next 24 to 48 hours.      LOS: 11 days   Osvaldo ShipperGokul Axiel Fjeld  [facility] Pager [phone] 07/26/2018, 11:20 AM  If 7PM-7AM, please contact night-coverage at [email or web address], password [email or web address]

## 2018-07-27 LAB — GLUCOSE, CAPILLARY
Glucose-Capillary: 182 mg/dL — ABNORMAL HIGH (ref 70–99)
Glucose-Capillary: 182 mg/dL — ABNORMAL HIGH (ref 70–99)

## 2018-07-27 LAB — BASIC METABOLIC PANEL
Anion gap: 11 (ref 5–15)
BUN: 6 mg/dL — ABNORMAL LOW (ref 8–23)
CO2: 28 mmol/L (ref 22–32)
Calcium: 8.7 mg/dL — ABNORMAL LOW (ref 8.9–10.3)
Chloride: 103 mmol/L (ref 98–111)
Creatinine, Ser: 0.83 mg/dL (ref 0.61–1.24)
GFR calc Af Amer: >60 mL/min (ref >60)
Glucose, Bld: 194 mg/dL — ABNORMAL HIGH (ref 70–99)
Potassium: 3.4 mmol/L — ABNORMAL LOW (ref 3.5–5.1)
Sodium: 142 mmol/L (ref 135–145)

## 2018-07-27 MED ORDER — DOCUSATE SODIUM 100 MG PO CAPS: 100.0000 mg | ORAL_CAPSULE | Freq: Two times a day (BID) | ORAL | 0 refills | Status: AC

## 2018-07-27 MED ORDER — ADULT MULTIVITAMIN W/MINERALS CH: 1.0000 | ORAL_TABLET | Freq: Every day | ORAL | 0 refills | Status: AC

## 2018-07-27 MED ORDER — APIXABAN 5 MG PO TABS: 5.0000 mg | ORAL_TABLET | Freq: Two times a day (BID) | ORAL | 1 refills | Status: AC

## 2018-07-27 MED ORDER — AMLODIPINE BESYLATE 10 MG PO TABS: 10.0000 mg | ORAL_TABLET | Freq: Every day | ORAL | 0 refills | Status: AC

## 2018-07-27 MED ORDER — POLYETHYLENE GLYCOL 3350 17 G PO PACK: 17.0000 g | PACK | Freq: Every day | ORAL | 0 refills | Status: AC

## 2018-07-27 MED ORDER — METOPROLOL TARTRATE 25 MG PO TABS: 25.0000 mg | ORAL_TABLET | Freq: Two times a day (BID) | ORAL | 0 refills | Status: AC

## 2018-07-27 MED ORDER — POTASSIUM CHLORIDE CRYS ER 20 MEQ PO TBCR: 20.0000 meq | EXTENDED_RELEASE_TABLET | Freq: Every day | ORAL | 0 refills | Status: AC

## 2018-07-27 MED ORDER — POTASSIUM CHLORIDE CRYS ER 20 MEQ PO TBCR
40.0000 meq | EXTENDED_RELEASE_TABLET | Freq: Once | ORAL | Status: AC
  Administered 2018-07-27: 40 meq via ORAL
  Filled 2018-07-27: qty 2

## 2018-07-27 MED ORDER — CEPHALEXIN 500 MG PO CAPS: 500.0000 mg | ORAL_CAPSULE | Freq: Three times a day (TID) | ORAL | 0 refills | Status: AC

## 2018-07-27 NOTE — Discharge Summary (Signed)
[facility]  Physician Discharge Summary   Patient ID: Tom Wang MRN: [id number] DOB/AGE: 01/08/1925 84 y.o.  Admit date: 12020/09/1417 Discharge date: 07/27/2018  PCP: [name]  DISCHARGE DIAGNOSES:  Acute metabolic encephalopathy, improved Acute stroke likely embolic Acute cholecystitis status post percutaneous drain Essential hypertension Aspiration pneumonia History of constipation Normocytic anemia    RECOMMENDATIONS FOR OUTPATIENT FOLLOW UP: 1. Home health has been ordered   DISCHARGE CONDITION: fair  Diet recommendation: Regular with thin liquids as tolerated.  Full aspiration precautions recommended to family.  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 Recent admission 1113-11/16 with acute CVA left-sided. Readmitted on 12020/09/1417 with generalized abdominal pain found to have cholecystitis with large stone in gallbladder neck 4 cm. General surgery consulted-not felt to be a surgical candidate.  He underwent a percutaneous cholecystostomy drain placement. Hospitalization complicated by metabolic encephalopathy and increasing confusion ultimately MRI done, found to have a stroke.  Consultants: Neurology.  General surgery.  Palliative medicine  Procedures:  Echocardiogram Study Conclusions  - Left ventricle: Moderate basal septal hypertrophy. The cavity size was normal. Systolic function was vigorous. The estimated ejection fraction was in the range of 65% to 70%. Doppler parameters are consistent with both elevated ventricular end-diastolic filling pressure and elevated left atrial filling pressure. - Left atrium: The atrium was mildly  dilated.  Percutaneous cholecystostomy drain  Antibiotics: Ceftriaxone and metronidazole will be discontinued today Keflex 12/25   HOSPITAL COURSE:   Acute metabolic encephalopathy Likely multifactorial including hospital stay, acute infection, acute stroke.  Baseline is not clearly known but at least at home he was eating.  Patient's mentation has improved.  He was seen by speech therapy who cleared him for oral intake.  Seen by palliative medicine.  Appreciate their assistance.  Family wants patient to be full code for now.     Acute stroke Due to persistent encephalopathy patient underwent MRI which showed multiple infarcts in the left PCA territory.  Patient seen by neurology.  Placed on Eliquis.  Patient noted to have dysphagia.  Since his mentation improved he was cleared for oral intake by speech therapy.  Family appears to be pleased with this.  Aspiration pneumonia Patient found to have right-sided consolidation and small pleural effusion.  Patient placed on ceftriaxone and metronidazole.  Now that he is on oral diet aspiration precautions need to be followed.  Will be discharged on a few more days of Keflex  History of constipation Has required enemas.  Per family has not had a bowel movement in many days.  Likely because of poor oral intake.    He was placed on stool softeners and laxatives.  Acute cholecystitis Status post cholecystostomy drain placement on 12/16.  To be followed in the drain clinic.  Fluid did grow E. Coli and Proteus.  Both were sensitive to ceftriaxone.  He has received 10 days of antibiotics for his cholecystitis.  He remains afebrile.  General surgery to follow as well.  Diabetes mellitus type 2 CBGs have been stable off of his oral hypoglycemic agents.  We will keep him off of his oral agents for now.  Family will check CBGs at home.  Consider reinitiating treatment only if CBGs noted to be greater than 180.  Essential hypertension Blood  pressure remained poorly controlled due to his inability to take orally.  Family wanted him off of losartan.  Amlodipine was initiated along with low-dose of metoprolol.  He will be continued on the same.  Blood pressure hopefully should start improving over the next few days.    History of GERD Stable  History of hyperlipidemia Stable  Normocytic anemia Hemoglobin is stable.  No evidence of overt bleeding.  Hypokalemia Potassium has improved.  He will be given a prescription for daily supplement.   Overall stable.  Family pleased with his progress.  They are okay with taking him home today.  They request transportation.   PERTINENT LABS:  The results of significant diagnostics from this hospitalization (including imaging, microbiology, ancillary and laboratory) are listed below for reference.    Microbiology: Recent Results (from the past 240 hour(s))  Aerobic/Anaerobic Culture (surgical/deep wound)     Status: None   Collection Time: 07/17/18  5:01 PM  Result Value Ref Range Status   Specimen Description GALL BLADDER BILE  Final   Special Requests NONE  Final   Gram Stain   Final    ABUNDANT WBC PRESENT, PREDOMINANTLY PMN MODERATE GRAM NEGATIVE RODS RARE GRAM VARIABLE ROD Performed at [facility], [address] [place], [place] [address]    Culture   Final    MODERATE ESCHERICHIA COLI ABUNDANT PROTEUS MIRABILIS MIXED ANAEROBIC FLORA PRESENT.  CALL LAB IF FURTHER IID REQUIRED.    Report Status 07/22/2018 FINAL  Final   Organism ID, Bacteria PROTEUS MIRABILIS  Final   Organism ID, Bacteria ESCHERICHIA COLI  Final      Susceptibility   Escherichia coli - MIC*    AMPICILLIN >=32 RESISTANT Resistant     CEFAZOLIN 8 SENSITIVE Sensitive     CEFEPIME <=1 SENSITIVE Sensitive     CEFTAZIDIME <=1 SENSITIVE Sensitive     CEFTRIAXONE <=1 SENSITIVE Sensitive     CIPROFLOXACIN <=0.25 SENSITIVE Sensitive     GENTAMICIN <=1 SENSITIVE Sensitive     IMIPENEM <=0.25  SENSITIVE Sensitive     TRIMETH/SULFA 40 SENSITIVE Sensitive     AMPICILLIN/SULBACTAM 8 SENSITIVE Sensitive     PIP/TAZO <=4 SENSITIVE Sensitive     Extended ESBL NEGATIVE Sensitive     * MODERATE ESCHERICHIA COLI   Proteus mirabilis - MIC*    AMPICILLIN <=2 SENSITIVE Sensitive     CEFAZOLIN <=4 SENSITIVE Sensitive     CEFEPIME <=1 SENSITIVE Sensitive     CEFTAZIDIME <=1 SENSITIVE Sensitive     CEFTRIAXONE <=1 SENSITIVE Sensitive     CIPROFLOXACIN >=4 RESISTANT Resistant     GENTAMICIN <=1 SENSITIVE Sensitive     IMIPENEM 2 SENSITIVE Sensitive     TRIMETH/SULFA <=20 SENSITIVE Sensitive     AMPICILLIN/SULBACTAM <=2 SENSITIVE Sensitive     PIP/TAZO <=4 SENSITIVE Sensitive     * ABUNDANT PROTEUS MIRABILIS     Labs: Basic Metabolic Panel: Recent Labs  Lab 07/21/18 0113 07/22/18 0429 07/24/18 0152 07/25/18 0243 07/26/18 0952 07/27/18 0305  NA 142 144 147* 145 144 142  K 3.9 3.3* 2.9* 3.1* 3.0* 3.4*  CL 106 105 104 106 105 103  CO2 22 27 30 29 26 28   GLUCOSE 249* 201* 230* 187* 176* 194*  BUN 11 8 7* 7* 6* 6*  CREATININE 1.12 0.99 0.86 0.91 0.89 0.83  CALCIUM 9.0 9.0 9.0 8.8* 8.7* 8.7*  MG 2.1  --   --  2.0  --   --  PHOS  --  2.8  --   --   --   --    Liver Function Tests: Recent Labs  Lab 07/21/18 0113 07/22/18 0429  AST 30  --   ALT 23  --   ALKPHOS 79  --   BILITOT 1.1  --   PROT 6.3*  --   ALBUMIN 2.4* 2.1*   CBC: Recent Labs  Lab 07/21/18 0113 07/22/18 0429 07/24/18 0152  WBC 13.3* 11.2* 9.9  NEUTROABS 9.0* 7.5 6.3  HGB 12.3* 11.2* 11.1*  HCT 38.3* 36.5* 34.9*  MCV 80.5 80.6 82.3  PLT 204 230 286    CBG: Recent Labs  Lab 07/26/18 1203 07/26/18 1724 07/26/18 2123 07/27/18 0752 07/27/18 1155  GLUCAP 179* 171* 169* 182* 182*     IMAGING STUDIES Dg Chest 2 View  Result Date: 108-29-2019 CLINICAL DATA:  Fever and vomiting. Possible infection. EXAM: CHEST - 2 VIEW COMPARISON:  06/14/2018. FINDINGS: Cardiomegaly. Low lung volumes. No  consolidation or edema. No effusion or pneumothorax. Calcified tortuous aorta. IMPRESSION: Cardiomegaly. No active disease. Electronically Signed   By: Elsie Stain M.D.   On: 108-29-2019 07:04   Ct Head Wo Contrast  Result Date: 07/21/2018 CLINICAL DATA:  Altered mental status. EXAM: CT HEAD WITHOUT CONTRAST TECHNIQUE: Contiguous axial images were obtained from the base of the skull through the vertex without intravenous contrast. COMPARISON:  MR brain and CT head dated July 18, 2018. FINDINGS: Brain: No evidence of acute infarction, hemorrhage, hydrocephalus, extra-axial collection or mass lesion/mass effect. Stable atrophy and chronic microvascular ischemic changes. Vascular: Atherosclerotic vascular calcification of the carotid siphons. No hyperdense vessel. Skull: Negative for fracture or focal lesion. Sinuses/Orbits: No acute finding. Other: None. IMPRESSION: 1. No acute intracranial abnormality. Known small acute infarcts in the left PCA territory are not seen by CT. No hemorrhagic transformation. Electronically Signed   By: Obie Dredge M.D.   On: 07/21/2018 00:32   Ct Head Wo Contrast  Result Date: 07/18/2018 CLINICAL DATA:  84 year old who is unresponsive to stimuli, an acute change in mental status. EXAM: CT HEAD WITHOUT CONTRAST TECHNIQUE: Contiguous axial images were obtained from the base of the skull through the vertex without intravenous contrast. COMPARISON:  CT head 108-29-2019 and earlier. MRI brain 06/15/2018 and earlier. FINDINGS: Brain: Moderate to severe age related cortical atrophy, mild deep atrophy and mild cerebellar atrophy, unchanged. Severe changes of small vessel disease of the white matter diffusely including the pons and brainstem, unchanged. No mass lesion. No midline shift. No acute hemorrhage or hematoma. No extra-axial fluid collections. No evidence of acute infarction. Vascular: Moderate BILATERAL carotid siphon and vertebral artery atherosclerosis. No hyperdense  vessel. Skull: No skull fracture or other focal osseous abnormality involving the skull. Sinuses/Orbits: Small mucous retention cyst or polyp involving the LEFT maxillary sinus. Minimal mucosal thickening involving the base of the frontal sinuses. Visualized paranasal sinuses otherwise well aerated. BILATERAL mastoid air cells and BILATERAL middle ear cavities well-aerated. Asymmetric hyperdense lens in the LEFT eye as noted previously. Orbits and globes otherwise unremarkable. Other: None. IMPRESSION: 1. No acute intracranial abnormality. 2. Stable moderate to severe age related generalized atrophy and severe chronic microvascular ischemic changes of the white matter. Electronically Signed   By: Hulan Saas M.D.   On: 07/18/2018 10:02   Ct Head Wo Contrast  Result Date: 108-29-2019 CLINICAL DATA:  Fever and vomiting. EXAM: CT HEAD WITHOUT CONTRAST TECHNIQUE: Contiguous axial images were obtained from the base of the skull through the vertex  without intravenous contrast. COMPARISON:  June 14, 2018 FINDINGS: Brain: No subdural, epidural, or subarachnoid hemorrhage. Cerebellum, brainstem, and basal cisterns are normal. Ventricles and sulci are unchanged. White matter changes are stable. No acute cortical ischemia or infarct. No mass effect or midline shift. Vascular: No hyperdense vessel or unexpected calcification. Skull: Normal. Negative for fracture or focal lesion. Sinuses/Orbits: No acute finding. Other: None. IMPRESSION: 1. No acute intracranial abnormalities. No cause for fever or vomiting noted. Electronically Signed   By: Gerome Sam III M.D   On: 07/15/2018 13:36   Mr Brain Wo Contrast  Result Date: 07/18/2018 CLINICAL DATA:  84 year old male with recent altered mental status, unresponsiveness. EXAM: MRI HEAD WITHOUT CONTRAST TECHNIQUE: Multiplanar, multiecho pulse sequences of the brain and surrounding structures were obtained without intravenous contrast. COMPARISON:  Head CTs 0937  hours today and earlier. Brain MRI 06/15/2018. FINDINGS: Brain: There are new scattered small foci of restricted diffusion in the left occipital lobe, including about the atrium of the left lateral ventricle as seen on series 3, image 28. The left thalamic and small left pontine infarcts seen last month have evolved as expected. No associated acute hemorrhage or mass effect. No other acute ischemia. Chronic small vessel disease in both hemispheres with right side brainstem Wallerian degeneration redemonstrated. Superimposed chronic pontine small vessel disease. Occasional small microhemorrhages (including in the right dorsal pons on series 9, image 44. No midline shift, mass effect, evidence of mass lesion, ventriculomegaly, extra-axial collection or acute intracranial hemorrhage. Cervicomedullary junction and pituitary are within normal limits. Vascular: Major intracranial vascular flow voids are stable, the vertebrobasilar flow voids are abnormal corresponding to the severe posterior circulation stenosis demonstrated by CTA and MRA in August this year. Skull and upper cervical spine: Negative visible cervical spine. Normal bone marrow signal. Sinuses/Orbits: Stable, negative. Other: Mastoids remain clear. Visible internal auditory structures appear normal. Scalp and face soft tissues appear negative. IMPRESSION: 1. Scattered small acute infarcts in the left PCA territory with no associated hemorrhage or mass effect. 2. Expected evolution of the November left thalamic and left pontine infarcts. 3. Underlying severe posterior circulation atherosclerosis and stenosis as seen by CTA and MRA and August. Electronically Signed   By: Odessa Fleming M.D.   On: 07/18/2018 16:29   Nm Hepatobiliary Liver Func  Result Date: 07/16/2018 CLINICAL DATA:  Abnormal CT scan.  Abdominal pain. EXAM: NUCLEAR MEDICINE HEPATOBILIARY IMAGING TECHNIQUE: Sequential images of the abdomen were obtained out to 60 minutes following intravenous  administration of radiopharmaceutical. RADIOPHARMACEUTICALS:  5.3 mCi Tc-36m  Choletec IV COMPARISON:  None. FINDINGS: Prompt uptake and biliary excretion of activity by the liver is seen. The gallbladder does not fill during 2 hours of imaging. Biliary activity passes into small bowel, consistent with patent common bile duct. IMPRESSION: The gallbladder does not fill during 2 hours of imaging. Given the CT findings, findings are most consistent with acute cholecystitis. Lack of gallbladder filling can also be seen in the setting of chronic cholecystitis, recent meal, prolonged fasting, or severe acute illness. Electronically Signed   By: Gerome Sam III M.D   On: 07/16/2018 12:02   Ct Abdomen Pelvis W Contrast  Result Date: 07/15/2018 CLINICAL DATA:  Acute generalized abdominal pain.  Vomiting. EXAM: CT ABDOMEN AND PELVIS WITH CONTRAST TECHNIQUE: Multidetector CT imaging of the abdomen and pelvis was performed using the standard protocol following bolus administration of intravenous contrast. CONTRAST:  OMNIPAQUE IOHEXOL 300 MG/ML  SOLN COMPARISON:  None. FINDINGS: Lower chest: Minimal bibasilar  posterior subsegmental atelectasis is noted. Hepatobiliary: No biliary dilatation is noted. The liver is unremarkable. Multiple gallstones are noted with the largest measuring 4 cm in the neck of the gallbladder. Mild gallbladder dilatation is noted with mild surrounding fluid and inflammatory changes concerning for acute cholecystitis. Pancreas: Unremarkable. No pancreatic ductal dilatation or surrounding inflammatory changes. Spleen: Normal in size without focal abnormality. Adrenals/Urinary Tract: Adrenal glands are unremarkable. Kidneys are normal, without renal calculi, focal lesion, or hydronephrosis. Bladder is unremarkable. Stomach/Bowel: Stomach is within normal limits. Appendix appears normal. No evidence of bowel wall thickening, distention, or inflammatory changes. Vascular/Lymphatic: Aortic  atherosclerosis. No enlarged abdominal or pelvic lymph nodes. Reproductive: Prostate is unremarkable. Other: Moderate fat containing right inguinal hernia is noted. No ascites is noted. Musculoskeletal: No acute or significant osseous findings. IMPRESSION: Cholelithiasis is noted with 4 cm gallstone noted in neck of gallbladder. Mild gallbladder dilatation is noted with mild surrounding fluid and inflammatory changes concerning for acute cholecystitis. Moderate size fat containing right inguinal hernia. Aortic Atherosclerosis (ICD10-I70.0). Electronically Signed   By: Lupita Raider, M.D.   On: 1Jul 05, 202019 13:39   Ir Perc Cholecystostomy  Result Date: 07/17/2018 INDICATION: Acute cholecystitis EXAM: CHOLECYSTOSTOMY MEDICATIONS: Vancomycin 1 g ANESTHESIA/SEDATION: Fentanyl 75 mcg IV; Versed 1 mg IV Moderate Sedation Time:  13 minutes The patient was continuously monitored during the procedure by the interventional radiology nurse under my direct supervision. FLUOROSCOPY TIME:  Fluoroscopy Time: 1 minutes 18 seconds (10 mGy). COMPLICATIONS: None immediate. PROCEDURE: Informed written consent was obtained from the patient after a thorough discussion of the procedural risks, benefits and alternatives. All questions were addressed. Maximal Sterile Barrier Technique was utilized including caps, mask, sterile gowns, sterile gloves, sterile drape, hand hygiene and skin antiseptic. A timeout was performed prior to the initiation of the procedure. The right upper quadrant was prepped with ChloraPrep in a sterile fashion, and a sterile drape was applied covering the operative field. A sterile gown and sterile gloves were used for the procedure. A 21 gauge needle was inserted into the gallbladder lumen under sonographic guidance and via transhepatic approach. It was removed over a 018 wire, which was upsized to a 3-J. A 10-French drain was advanced over the wire and coiled in the gallbladder lumen. It was sewn to the skin  after being string fixed. FINDINGS: Imaging demonstrates placement of a 10 French drain into the lumen of the gallbladder. Contrast fills the gallbladder. A gallstone is noted. The cystic duct is occluded. IMPRESSION: Successful cholecystostomy. This needs to remain in place at least 6 weeks. Electronically Signed   By: Jolaine Click M.D.   On: 07/17/2018 16:30   Dg Chest Port 1 View  Result Date: 07/23/2018 CLINICAL DATA:  Pneumonia, history hypertension, diabetes mellitus EXAM: PORTABLE CHEST 1 VIEW COMPARISON:  Portable exam 0948 hours compared to 07/21/2018 FINDINGS: Wall heart size and pulmonary vascularity. Atherosclerotic calcification aorta. Persistent RIGHT pleural effusion. Persistent opacity at RIGHT base which could represent atelectasis and/or consolidation. Remaining lungs clear. No pleural effusion or pneumothorax. Bones demineralized. Pigtail drainage catheter in RIGHT upper quadrant. IMPRESSION: Persistent RIGHT pleural effusion and basilar atelectasis and/or consolidation. Electronically Signed   By: Ulyses Southward M.D.   On: 07/23/2018 10:02   Dg Chest Port 1 View  Result Date: 07/21/2018 CLINICAL DATA:  Encephalopathy EXAM: PORTABLE CHEST 1 VIEW COMPARISON:  1Jul 05, 202019 FINDINGS: Shallow inspiration. Mild cardiac enlargement with mild pulmonary vascular congestion. Increasing opacity on the right mid and lower lung zone likely representing layering pleural  effusion and atelectasis or consolidation. No pneumothorax. Calcification of the aorta. IMPRESSION: Cardiac enlargement with mild pulmonary vascular congestion. Increasing opacity on the right likely representing layering pleural effusion with atelectasis or consolidation. Electronically Signed   By: Burman NievesWilliam  Stevens M.D.   On: 07/21/2018 01:18    DISCHARGE EXAMINATION: Vitals:   07/26/18 0510 07/26/18 2126 07/27/18 0607 07/27/18 1327  BP: (!) 172/66 (!) 168/87 (!) 172/70 (!) 156/70  Pulse: 62 79 72 72  Resp: 18 20 16    Temp: 97.8  F (36.6 C) 98.4 F (36.9 C) 99.2 F (37.3 C) 98.9 F (37.2 C)  TempSrc: Oral Axillary Oral Oral  SpO2: 95% 95% 91% 94%  Weight:      Height:       General appearance: Patient does open his eyes when his name is called.  However does not really communicate. Resp: Normal effort at rest.  Crackles at the bases.  No wheezing or rhonchi. Cardio: regular rate and rhythm, S1, S2 normal, no murmur, click, rub or gallop GI: soft, non-tender; bowel sounds normal; no masses,  no organomegaly  DISPOSITION: Home with family  Discharge Instructions    Ambulatory referral to Neurology   Complete by:  As directed    Follow up with Dr. Pearlean BrownieSethi, Dr. Marjory LiesPenumalli or [facility]6 weeks. Too complicated for RN to follow. Thanks.   Call MD for:  difficulty breathing, headache or visual disturbances   Complete by:  As directed    Call MD for:  extreme fatigue   Complete by:  As directed    Call MD for:  persistant dizziness or light-headedness   Complete by:  As directed    Call MD for:  persistant nausea and vomiting   Complete by:  As directed    Call MD for:  severe uncontrolled pain   Complete by:  As directed    Call MD for:  temperature >100.4   Complete by:  As directed    Discharge instructions   Complete by:  As directed    Please take your medications as prescribed.  Use full aspiration precautions.  To be fed only when fully awake and alert and in an upright position.  You were cared for by a hospitalist during your hospital stay. If you have any questions about your discharge medications or the care you received while you were in the hospital after you are discharged, you can call the unit and asked to speak with the hospitalist on call if the hospitalist that took care of you is not available. Once you are discharged, your primary care physician will handle any further medical issues. Please note that NO REFILLS for any discharge medications will be authorized once you are  discharged, as it is imperative that you return to your primary care physician (or establish a relationship with a primary care physician if you do not have one) for your aftercare needs so that they can reassess your need for medications and monitor your lab values. If you do not have a primary care physician, you can call [phone] for a physician referral.   Increase activity slowly   Complete by:  As directed        Allergies as of 07/27/2018      Reactions   Morphine And Related Other (See Comments)   SEVERE REACTION   Other Other (See Comments)   NO NARCOTICS PER FAMILY   Gabapentin Other (See Comments)   AGITATION, COMBATIVE   Insulins  Other (See Comments)   Stroke like symptoms per family   Lantus [insulin Glargine] Other (See Comments)   unknown   Penicillins Other (See Comments)   UNKNOWN      Medication List    STOP taking these medications   aspirin 325 MG tablet   glipiZIDE 10 MG tablet Commonly known as:  GLUCOTROL   losartan 25 MG tablet Commonly known as:  COZAAR   Turmeric Curcumin Caps     TAKE these medications   acetaminophen 500 MG tablet Commonly known as:  TYLENOL Take 500 mg by mouth daily.   amLODipine 10 MG tablet Commonly known as:  NORVASC Take 1 tablet (10 mg total) by mouth daily.   apixaban 5 MG Tabs tablet Commonly known as:  ELIQUIS Take 1 tablet (5 mg total) by mouth 2 (two) times daily.   atorvastatin 40 MG tablet Commonly known as:  LIPITOR Take 1 tablet (40 mg total) by mouth daily at 6 PM.   cephALEXin 500 MG capsule Commonly known as:  KEFLEX Take 1 capsule (500 mg total) by mouth every 8 (eight) hours for 5 days.   cholecalciferol 1000 units tablet Commonly known as:  VITAMIN D Take 500 Units by mouth at bedtime.   docusate sodium 100 MG capsule Commonly known as:  COLACE Take 1 capsule (100 mg total) by mouth 2 (two) times daily.   metoprolol tartrate 25 MG tablet Commonly known as:  LOPRESSOR Take 1 tablet  (25 mg total) by mouth 2 (two) times daily.   multivitamin with minerals Tabs tablet Take 1 tablet by mouth daily.   omeprazole 20 MG capsule Commonly known as:  PRILOSEC Take 20 mg by mouth daily.   polyethylene glycol packet Commonly known as:  MIRALAX / GLYCOLAX Take 17 g by mouth daily.   potassium chloride SA 20 MEQ tablet Commonly known as:  K-DUR,KLOR-CON Take 1 tablet (20 mEq total) by mouth daily.   senna-docusate 8.6-50 MG tablet Commonly known as:  Senokot-S Take 2 tablets by mouth daily.   VITAMIN B-12 PO Take 1 tablet by mouth daily.   VITAMIN C PO Take 1 tablet by mouth daily.            Durable Medical Equipment  (From admission, onward)         Start     Ordered   07/19/18 1206  For home use only DME Other see comment  Once    Comments:  Please call patient's grand daughter Johnney KillianBrandie Cooper [phone]  with cost and for delivery Thanks   07/19/18 1207           Follow-up Information    [facility] - Dme Follow up.   Contact information: [address] [place] [address] [phone]        Micki RileySethi, Pramod S, MD. Schedule an appointment as soon as possible for a visit in 6 week(s).   Specialties:  Neurology, Radiology Contact information: [address] [place] [address] [phone]        Harriette Bouillonornett, Thomas, MD Follow up in 6 week(s).   Specialty:  General Surgery Why:  Our office will call you and arrange this follow up appointment Contact information: [address] [place] [address] [phone]        [facility] Schedule an appointment as soon as possible for a visit in 3 week(s).   Contact information: [address] [facility] [address] [place] [id number]  [phone]           TOTAL DISCHARGE TIME: 35 mins  Osvaldo Shipper  [facility] Pager [phone]  07/27/2018, 1:49 PM

## 2018-07-27 NOTE — Progress Notes (Signed)
Patient discharged to home. D/C paperwork reviewed with Gearldine BienenstockBrandy (Grandaughter) verbalizes understanding of all discharge instructions including discharge medications and follow up MD visits. [facility]

## 2018-07-27 NOTE — Social Work (Signed)
CSW acknowledging consult for transportation needs. Should pt need PTAR at discharge either RN CM or CSW can arrange this. Please call at number below.  Octavio GravesIsabel Asuzena Weis, MSW, [facility] [place] Clinical Social Work [phone]

## 2018-07-27 NOTE — Plan of Care (Signed)
  Problem: Education: Goal: Knowledge of General Education information will improve Description Including pain rating scale, medication(s)/side effects and non-pharmacologic comfort measures Outcome: Progressing   Problem: Health Behavior/Discharge Planning: Goal: Ability to manage health-related needs will improve Outcome: Progressing   

## 2018-07-27 NOTE — Care Management (Addendum)
Case manager spoke with patient's granddaughter,Brandi concerning discharge plan and transportation needs. Patient will discharge home via PTAR, CM has arranged pickup for 2:30pm.

## 2018-07-28 NOTE — Progress Notes (Signed)
Gearldine BienenstockBrandy- Patient's granddaughter called today stating her grandfather is labored breathing at home stating he cannot take a deep breath and would like for us to call in an order for oxygen. I confirmed with 6N AD and case manager the hospital is unable to do that considering he is not currently an active patient. I verbally explained to [facility], if her grandfather was labored breathing and in distress she would need to call 911 or bring him into the ED.    Patient requested I contact Dr. Rito EhrlichKrishnan, I left her a voicemail stating the above for the second time.

## 2018-08-28 ENCOUNTER — Other Ambulatory Visit: Payer: Self-pay | Admitting: Physician Assistant

## 2018-08-28 ENCOUNTER — Encounter (HOSPITAL_COMMUNITY): Payer: Self-pay | Admitting: Interventional Radiology

## 2018-08-28 ENCOUNTER — Ambulatory Visit (HOSPITAL_COMMUNITY)
Admission: RE | Admit: 2018-08-28 | Discharge: 2018-08-28 | Disposition: A | Discharge status: Home / Self Care | Source: Ambulatory Visit | Attending: Physician Assistant | Admitting: Physician Assistant

## 2018-08-28 DIAGNOSIS — Z4803 Encounter for change or removal of drains: Secondary | ICD-10-CM | POA: Diagnosis present

## 2018-08-28 DIAGNOSIS — K8043 Calculus of bile duct with acute cholecystitis with obstruction: Secondary | ICD-10-CM

## 2018-08-28 HISTORY — PX: IR EXCHANGE BILIARY DRAIN: IMG6046

## 2018-08-28 MED ORDER — LIDOCAINE HCL 1 % IJ SOLN
INTRAMUSCULAR | Status: DC | PRN
  Administered 2018-08-28: 5 mL

## 2018-08-28 MED ORDER — IOPAMIDOL (ISOVUE-300) INJECTION 61%
INTRAVENOUS | Status: AC
  Filled 2018-08-28: qty 50

## 2018-08-28 MED ORDER — LIDOCAINE HCL 1 % IJ SOLN
INTRAMUSCULAR | Status: AC
  Filled 2018-08-28: qty 20

## 2018-09-28 ENCOUNTER — Ambulatory Visit: Payer: Medicare HMO | Admitting: Neurology

## 2018-10-01 DEATH — deceased

## 2018-10-02 ENCOUNTER — Encounter: Payer: Self-pay | Admitting: Neurology

## 2018-11-20 ENCOUNTER — Other Ambulatory Visit (HOSPITAL_COMMUNITY): Payer: Medicare Other

## 2020-03-27 IMAGING — DX DG CHEST 2V
2 series · 2 of 2 positions shown · non-contrast
Comparison: 06/14/2018.

CLINICAL DATA: Fever and vomiting. Possible infection.

EXAM:
CHEST - 2 VIEW

[chest lat]
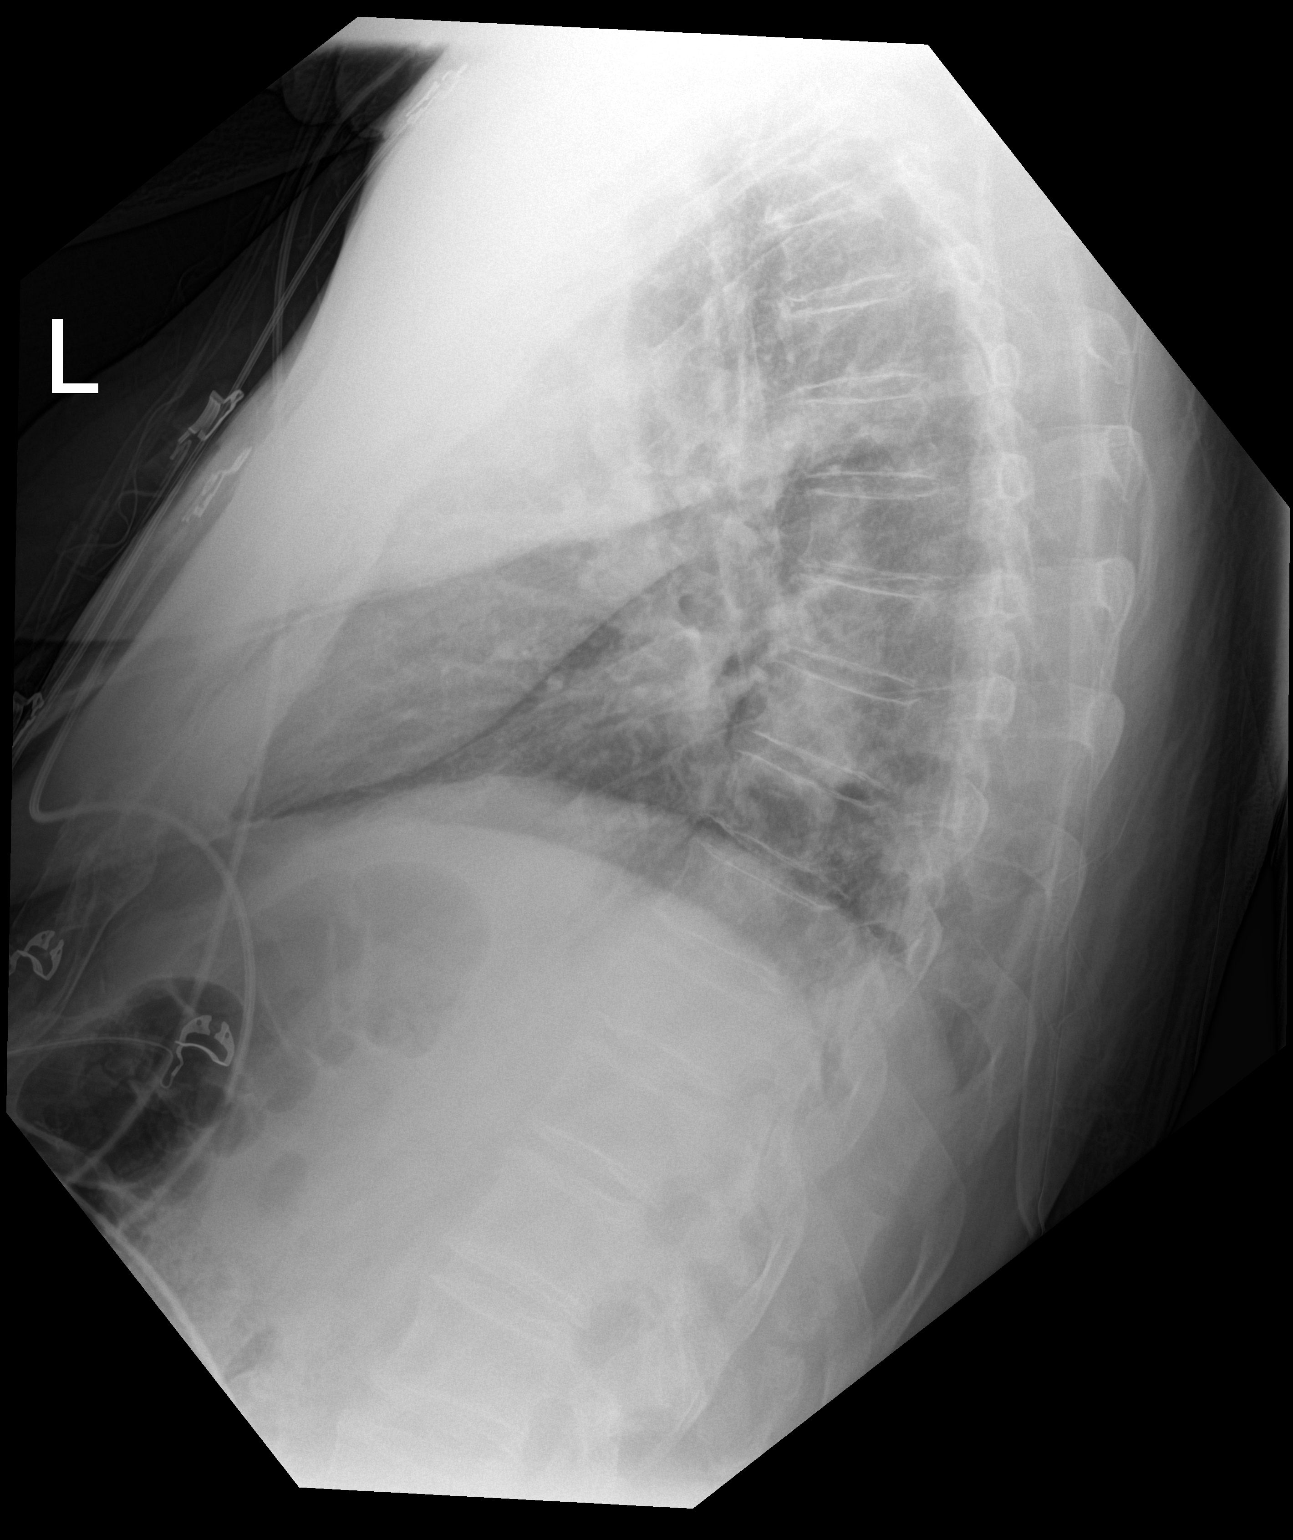

[chest ap]
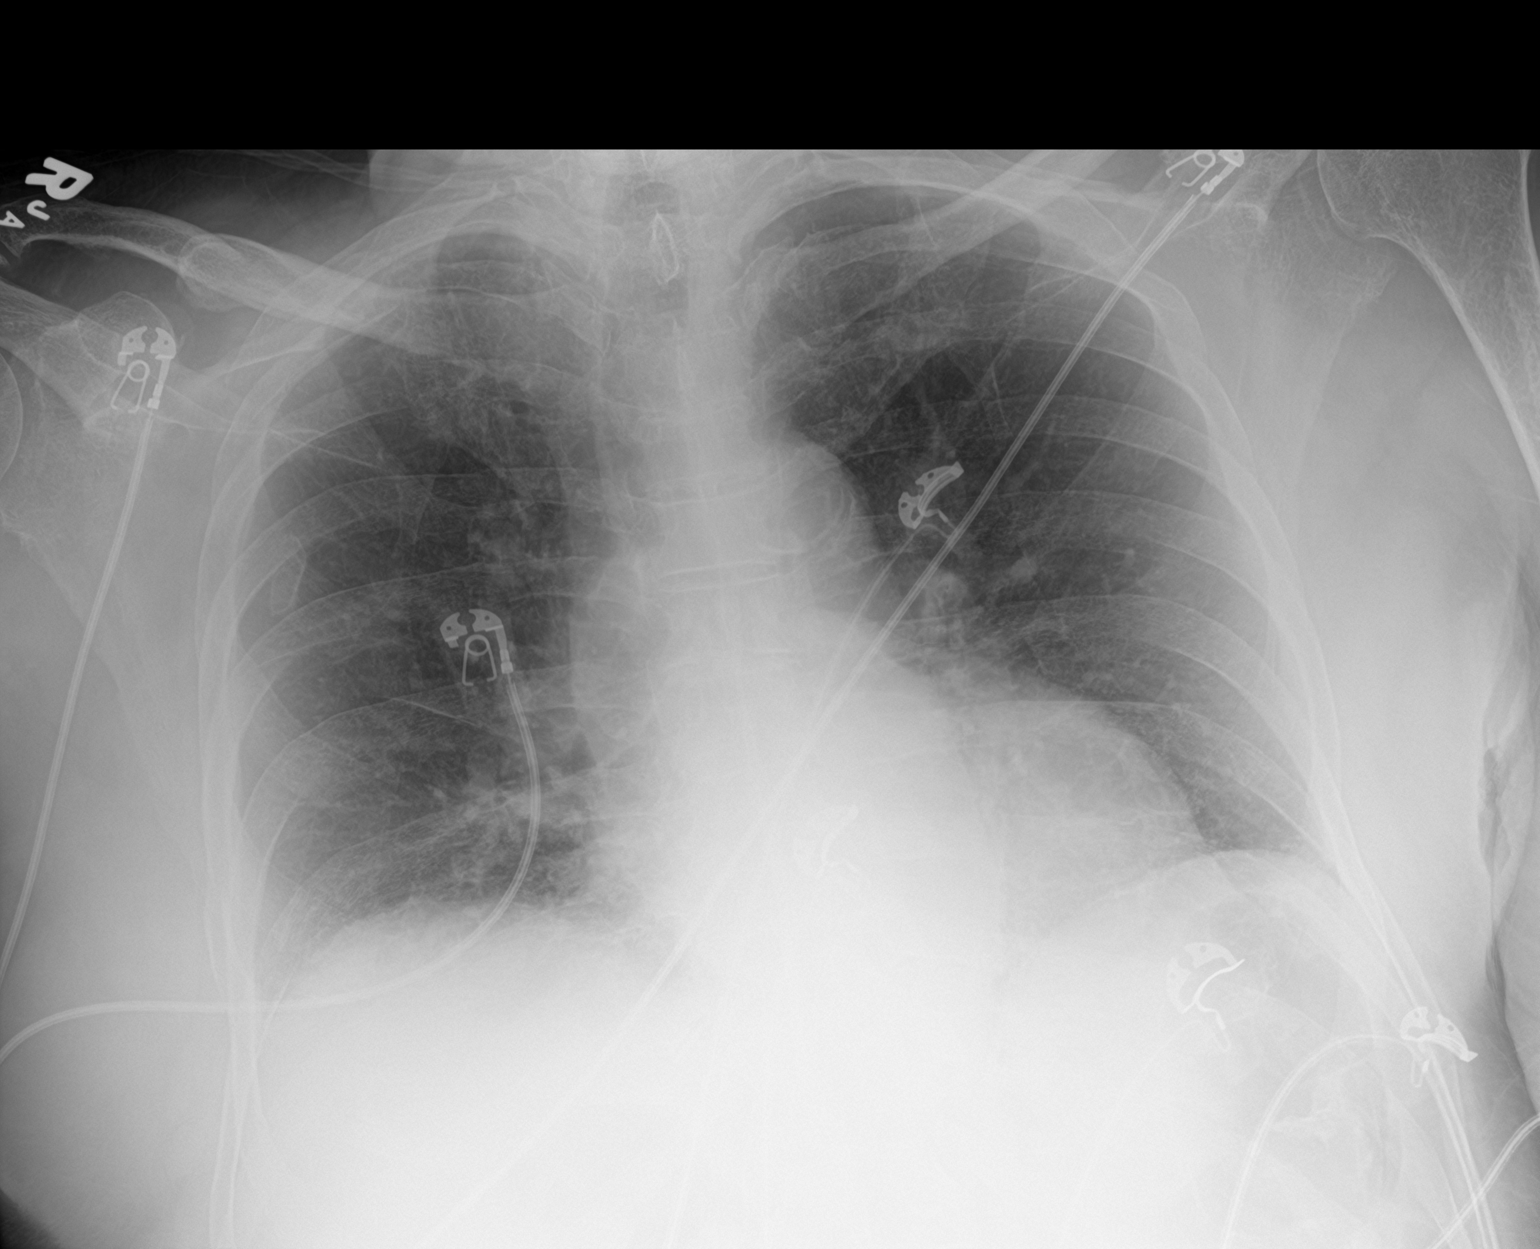

[2 of 2 positions shown; findings below may reference images not displayed]

FINDINGS: Cardiomegaly. Low lung volumes. No consolidation or edema. No
effusion or pneumothorax. Calcified tortuous aorta.
IMPRESSION: Cardiomegaly. No active disease.
